# Patient Record
Sex: Female | Born: 1945 | Race: Black or African American | Hispanic: No | Marital: Married | Smoking: Former smoker
Health system: Southern US, Community
[De-identification: ages and names within clinical notes are randomized; demographics above are authoritative.]

## PROBLEM LIST (undated history)

## (undated) DIAGNOSIS — C50919 Malignant neoplasm of unspecified site of unspecified female breast: Secondary | ICD-10-CM

## (undated) DIAGNOSIS — B379 Candidiasis, unspecified: Secondary | ICD-10-CM

## (undated) DIAGNOSIS — E785 Hyperlipidemia, unspecified: Secondary | ICD-10-CM

## (undated) DIAGNOSIS — F329 Major depressive disorder, single episode, unspecified: Secondary | ICD-10-CM

## (undated) DIAGNOSIS — N760 Acute vaginitis: Secondary | ICD-10-CM

## (undated) DIAGNOSIS — M1990 Unspecified osteoarthritis, unspecified site: Secondary | ICD-10-CM

## (undated) DIAGNOSIS — G43909 Migraine, unspecified, not intractable, without status migrainosus: Secondary | ICD-10-CM

## (undated) DIAGNOSIS — B009 Herpesviral infection, unspecified: Secondary | ICD-10-CM

## (undated) DIAGNOSIS — Z8669 Personal history of other diseases of the nervous system and sense organs: Secondary | ICD-10-CM

## (undated) DIAGNOSIS — F32A Depression, unspecified: Secondary | ICD-10-CM

## (undated) DIAGNOSIS — N952 Postmenopausal atrophic vaginitis: Secondary | ICD-10-CM

## (undated) DIAGNOSIS — I10 Essential (primary) hypertension: Secondary | ICD-10-CM

## (undated) DIAGNOSIS — L900 Lichen sclerosus et atrophicus: Secondary | ICD-10-CM

## (undated) HISTORY — DX: Postmenopausal atrophic vaginitis: N95.2

## (undated) HISTORY — DX: Migraine, unspecified, not intractable, without status migrainosus: G43.909

## (undated) HISTORY — DX: Major depressive disorder, single episode, unspecified: F32.9

## (undated) HISTORY — DX: Malignant neoplasm of unspecified site of unspecified female breast: C50.919

## (undated) HISTORY — PX: TONSILLECTOMY: SUR1361

## (undated) HISTORY — DX: Lichen sclerosus et atrophicus: L90.0

## (undated) HISTORY — DX: Herpesviral infection, unspecified: B00.9

## (undated) HISTORY — DX: Acute vaginitis: N76.0

## (undated) HISTORY — DX: Hyperlipidemia, unspecified: E78.5

## (undated) HISTORY — DX: Unspecified osteoarthritis, unspecified site: M19.90

## (undated) HISTORY — DX: Personal history of other diseases of the nervous system and sense organs: Z86.69

## (undated) HISTORY — DX: Depression, unspecified: F32.A

## (undated) HISTORY — PX: WISDOM TOOTH EXTRACTION: SHX21

## (undated) HISTORY — PX: TUBAL LIGATION: SHX77

## (undated) HISTORY — DX: Candidiasis, unspecified: B37.9

## (undated) HISTORY — PX: DILATION AND CURETTAGE OF UTERUS: SHX78

---

## 1985-12-28 HISTORY — PX: ABDOMINAL HYSTERECTOMY: SHX81

## 1998-12-12 ENCOUNTER — Other Ambulatory Visit: Admission: RE | Admit: 1998-12-12 | Discharge: 1998-12-12 | Payer: Self-pay | Admitting: Obstetrics & Gynecology

## 2000-10-28 ENCOUNTER — Encounter: Payer: Self-pay | Admitting: Internal Medicine

## 2000-10-28 ENCOUNTER — Encounter: Admission: RE | Admit: 2000-10-28 | Discharge: 2000-10-28 | Payer: Self-pay | Admitting: Internal Medicine

## 2001-05-04 ENCOUNTER — Encounter: Payer: Self-pay | Admitting: Internal Medicine

## 2001-05-04 ENCOUNTER — Encounter: Admission: RE | Admit: 2001-05-04 | Discharge: 2001-05-04 | Payer: Self-pay | Admitting: Internal Medicine

## 2003-01-19 ENCOUNTER — Encounter: Payer: Self-pay | Admitting: Internal Medicine

## 2003-01-19 ENCOUNTER — Encounter: Admission: RE | Admit: 2003-01-19 | Discharge: 2003-01-19 | Payer: Self-pay | Admitting: Internal Medicine

## 2004-12-28 DIAGNOSIS — N760 Acute vaginitis: Secondary | ICD-10-CM

## 2004-12-28 DIAGNOSIS — B379 Candidiasis, unspecified: Secondary | ICD-10-CM

## 2004-12-28 HISTORY — DX: Candidiasis, unspecified: B37.9

## 2004-12-28 HISTORY — DX: Acute vaginitis: N76.0

## 2005-06-12 ENCOUNTER — Encounter: Admission: RE | Admit: 2005-06-12 | Discharge: 2005-09-10 | Payer: Self-pay | Admitting: Internal Medicine

## 2007-12-29 DIAGNOSIS — N952 Postmenopausal atrophic vaginitis: Secondary | ICD-10-CM

## 2007-12-29 HISTORY — DX: Postmenopausal atrophic vaginitis: N95.2

## 2008-05-16 ENCOUNTER — Encounter (INDEPENDENT_AMBULATORY_CARE_PROVIDER_SITE_OTHER): Payer: Self-pay | Admitting: Obstetrics and Gynecology

## 2008-05-16 ENCOUNTER — Ambulatory Visit (HOSPITAL_COMMUNITY): Admission: RE | Admit: 2008-05-16 | Discharge: 2008-05-16 | Payer: Self-pay | Admitting: Obstetrics and Gynecology

## 2011-05-12 NOTE — H&P (Signed)
Elizabeth Benton, ROSSETTI NO.:  [id number]   MEDICAL RECORD NO.:  [id number]          PATIENT TYPE:   LOCATION:                                FACILITY:  [facility]   PHYSICIAN:  Naima A. Dillard, M.D.      DATE OF BIRTH:   DATE OF ADMISSION:  05/16/2008  DATE OF DISCHARGE:                              HISTORY & PHYSICAL   The patient has chronic vaginitis and vulvitis.  The patient is a 65-  year-old, gravida 4, para 2, who was diagnosed with HSV-2 and vaginal  atrophy. Despite Valtrex and Estrace cream, the patient continues to  have severe itching, pain, and vulvitis.  The patient also had an  abnormal Pap smear, but her colposcopy and biopsy were found to be  negative.  Her vagina remains red and dry.  Despite trying new and  different treatments, she has not gotten any relief.  The patient has  been tried on [employer].  She has been tried on Valtrex. She has tried  Estrace cream.  She has tried acyclovir cream and jelly and is still  without relief.  Offered to do a biopsy in the office, but the patient  said the area was too painful, and she wanted to do it under sedation.   PAST MEDICAL HISTORY:  Significant for above and migraine headaches.   MEDICATIONS:  Include Imitrex and Valtrex.   PAST SURGICAL HISTORY:  Significant for a hysterectomy in 1986 and a  D&C, also history of tubal ligation.   PAST OB HISTORY:  History of vaginal delivery x2 and 2 miscarriages.   ALLERGIES:  DEMEROL.   SOCIAL HISTORY:  The patient denies any tobacco, alcohol, or drug use.   REVIEW OF SYSTEMS:  GENITOURINARY:  Significant for HSV-2.  NEUROLOGIC:  Significant for headaches.  MUSCULOSKELETAL:  No weakness.  CARDIOVASCULAR:  No heart palpitations.  ENDOCRINE:  No endocrine  disorders.   PHYSICAL EXAMINATION:  VITAL SIGNS:  Blood pressure 130/78, temperature  98.2.  She weighs 142.5 pounds.  HEENT:  Pupils are equal.  Hearing is normal.  Oropharynx clear.  NECK:  Thyroid is not  enlarged.  HEART:  Regular rate and rhythm.  LUNGS:  Clear to auscultation bilaterally.  BREASTS:  No masses, discharge, skin change, nipple retractions.  BACK:  No CVA tenderness bilaterally.  ABDOMEN:  Nontender without mass or organomegaly.  EXTREMITIES:  No cyanosis, clubbing, or edema.  NEUROLOGIC:  Within normal limits.  PELVIC:  Vaginal exam is within normal limits.  Cervix and uterus are  surgically absent.  Adnexa have no masses.  Rectovaginal no masses.   ASSESSMENT:  1. Vaginal atrophy.  2. Herpesvirus type 2  3. Chronic vaginitis.  4. Vulvitis.   PLAN:  Biopsy for definitive answers while we are unable to successfully  treat this.      Naima A. Normand Sloop, M.D.  Electronically Signed     NAD/MEDQ  D:  05/15/2008  T:  05/15/2008  Job:  [id number]

## 2011-05-12 NOTE — Op Note (Signed)
Elizabeth Benton, Elizabeth Benton               ACCOUNT NO.:  [id number]   MEDICAL RECORD NO.:  [id number]          PATIENT TYPE:  AMB   LOCATION:  [facility]                           FACILITY:  [facility]   PHYSICIAN:  Naima A. Dillard, M.D. DATE OF BIRTH:  [birth year]   DATE OF PROCEDURE:  05/16/2008  DATE OF DISCHARGE:                               OPERATIVE REPORT   PREOPERATIVE DIAGNOSIS:  Chronic vulvitis.   POSTOPERATIVE DIAGNOSIS:  Chronic vulvitis.   PROCEDURE:  Vulvar biopsy.   SURGEON:  Naima A. Dillard, M.D.   ASSISTANT:  None.   ANESTHESIA:  MAC and local.   FINDINGS:  Atrophic erythematous vulva and perineum.   SPECIMENS:  Vulvar biopsies.  They were sent to pathology.   ESTIMATED BLOOD LOSS:  Minimal.   COMPLICATIONS:  None.   PROCEDURE:  The patient was taken to the operating room where she was  given MAC anesthesia, placed in dorsal lithotomy position, and prepped  and draped in a normal sterile fashion.  A total of 10 mL was placed at  each biopsy site.  The first biopsy was done on the patient's left labia  majora and then on the patient's left perineum, and then on the  patient's right labia majora.  Each biopsy was done in elliptical  fashion with full thickness of the dermis with a knife and labeled  appropriately.  The bleeding was made hemostatic using 3-0 chromic in  the dermis and then 3-0 Monocryl for subcuticular closure.  The  remainder of the lidocaine was placed underneath the areas for  anesthesia. We drained the patient's bladder.  The sponge, lap, and  needles counts were correct.  The patient went to recovery room in  stable condition.      Naima A. Normand Sloop, M.D.  Electronically Signed     NAD/MEDQ  D:  05/16/2008  T:  05/17/2008  Job:  [id number]

## 2011-06-22 ENCOUNTER — Encounter (INDEPENDENT_AMBULATORY_CARE_PROVIDER_SITE_OTHER): Payer: Self-pay | Admitting: Surgery

## 2011-06-23 ENCOUNTER — Ambulatory Visit (INDEPENDENT_AMBULATORY_CARE_PROVIDER_SITE_OTHER): Payer: BC Managed Care – PPO | Admitting: Surgery

## 2011-06-23 ENCOUNTER — Encounter (INDEPENDENT_AMBULATORY_CARE_PROVIDER_SITE_OTHER): Payer: Self-pay | Admitting: Surgery

## 2011-06-23 VITALS — BP 138/84 | HR 72 | Temp 97.1°F | Ht 60.0 in | Wt 142.6 lb

## 2011-06-23 DIAGNOSIS — Z789 Other specified health status: Secondary | ICD-10-CM

## 2011-06-23 DIAGNOSIS — J349 Unspecified disorder of nose and nasal sinuses: Secondary | ICD-10-CM | POA: Insufficient documentation

## 2011-06-23 DIAGNOSIS — R222 Localized swelling, mass and lump, trunk: Secondary | ICD-10-CM | POA: Insufficient documentation

## 2011-06-23 DIAGNOSIS — J329 Chronic sinusitis, unspecified: Secondary | ICD-10-CM

## 2011-06-23 DIAGNOSIS — R229 Localized swelling, mass and lump, unspecified: Secondary | ICD-10-CM

## 2011-06-23 DIAGNOSIS — Z973 Presence of spectacles and contact lenses: Secondary | ICD-10-CM

## 2011-06-23 NOTE — Patient Instructions (Signed)
Pt needs to be scheduled for OR.  Follow up approx. 1 week post op

## 2011-06-23 NOTE — Progress Notes (Signed)
Subjective:     Patient ID: Elizabeth Benton, female   DOB: [birth year], 65 y.o.   MRN: [id number]    BP 138/84  Pulse 72  Temp(Src) 97.1 F (36.2 C) (Temporal)  Ht 5' (1.524 m)  Wt 142 lb 9.6 oz (64.683 kg)  BMI 27.85 kg/m2    HPI This is a 65 year old female referred for a mass on her right lower back. It has been there for at least 6 or 7 months. It has been causing lower back discomfort. It has slowly been getting larger. She has some discomfort in her right hip and discomfort down her leg. The pain is mild to moderate. She has had no similar masses in the past.  Review of Systems  Constitutional: Negative.   Eyes: Negative.   Respiratory: Negative.   Cardiovascular: Negative.   Gastrointestinal: Negative.   Genitourinary: Negative.   Musculoskeletal: Negative.   Neurological: Positive for headaches.  Hematological: Negative.   Psychiatric/Behavioral: Negative.        Objective:   Physical Exam  Constitutional: She is oriented to person, place, and time. She appears well-developed and well-nourished.  HENT:  Head: Atraumatic.  Nose: Nose normal.  Mouth/Throat: Oropharynx is clear and moist.  Eyes: Pupils are equal, round, and reactive to light.  Neck: Normal range of motion. Neck supple. No tracheal deviation present. No thyromegaly present.  Cardiovascular: Normal rate, regular rhythm and normal heart sounds.   No murmur heard. Pulmonary/Chest: Effort normal and breath sounds normal. No respiratory distress.  Musculoskeletal: Normal range of motion. She exhibits no edema and no tenderness.       Two separate 3cm masses in the right lower back.  Both are superficial and mobile and non-tender  Lymphadenopathy:    She has no cervical adenopathy.  Neurological: She is alert and oriented to person, place, and time. No cranial nerve deficit. Coordination normal.  Skin: Skin is warm and dry. No rash noted. No erythema. No pallor.       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 Plan:     Given that the patient is symptomatic and I cannot rule out malignancy, removal of these masses is recommended. I have discussed this with the patient in detail. I have discussed the risk of the surgery which include but are not limited to bleeding, infection, injury to stranding structures, recurrence, etc.Surgery will thus be scheduled.

## 2011-07-13 ENCOUNTER — Ambulatory Visit (HOSPITAL_BASED_OUTPATIENT_CLINIC_OR_DEPARTMENT_OTHER): Admission: RE | Admit: 2011-07-13 | Payer: BC Managed Care – PPO | Source: Ambulatory Visit | Admitting: General Surgery

## 2011-07-22 ENCOUNTER — Encounter (INDEPENDENT_AMBULATORY_CARE_PROVIDER_SITE_OTHER): Payer: BC Managed Care – PPO | Admitting: Surgery

## 2011-09-23 LAB — CBC
HCT: 39.8
Hemoglobin: 13.4
MCHC: 33.6
MCV: 85.4
Platelets: 230
RBC: 4.66
RDW: 14.1
WBC: 5.7

## 2012-04-22 ENCOUNTER — Telehealth: Payer: Self-pay | Admitting: Obstetrics and Gynecology

## 2012-04-22 NOTE — Telephone Encounter (Signed)
Routed to niccole 

## 2012-04-22 NOTE — Telephone Encounter (Signed)
Spoke with pt rgd msg informed pt ND out of office will consult with her on Monday and give her a call backpt voice understanding

## 2012-04-24 NOTE — Telephone Encounter (Signed)
Yes she may have a refill on the clabetsol

## 2012-04-25 ENCOUNTER — Other Ambulatory Visit: Payer: Self-pay

## 2012-04-25 MED ORDER — CLOBETASOL PROPIONATE 0.05 % EX CREA: TOPICAL_CREAM | CUTANEOUS | Status: DC

## 2012-04-27 ENCOUNTER — Other Ambulatory Visit: Payer: Self-pay | Admitting: Obstetrics and Gynecology

## 2012-04-27 NOTE — Telephone Encounter (Signed)
Lm on vm rx clobetasol called to [facility] [address] can pick up at [facility] [address]

## 2012-06-01 ENCOUNTER — Ambulatory Visit (INDEPENDENT_AMBULATORY_CARE_PROVIDER_SITE_OTHER): Payer: PRIVATE HEALTH INSURANCE | Admitting: Obstetrics and Gynecology

## 2012-06-01 ENCOUNTER — Encounter: Payer: Self-pay | Admitting: Obstetrics and Gynecology

## 2012-06-01 VITALS — BP 110/70 | HR 72 | Ht 60.5 in | Wt 141.0 lb

## 2012-06-01 DIAGNOSIS — F3289 Other specified depressive episodes: Secondary | ICD-10-CM

## 2012-06-01 DIAGNOSIS — N952 Postmenopausal atrophic vaginitis: Secondary | ICD-10-CM

## 2012-06-01 DIAGNOSIS — B3731 Acute candidiasis of vulva and vagina: Secondary | ICD-10-CM

## 2012-06-01 DIAGNOSIS — B373 Candidiasis of vulva and vagina: Secondary | ICD-10-CM

## 2012-06-01 DIAGNOSIS — Z8669 Personal history of other diseases of the nervous system and sense organs: Secondary | ICD-10-CM

## 2012-06-01 DIAGNOSIS — B009 Herpesviral infection, unspecified: Secondary | ICD-10-CM

## 2012-06-01 DIAGNOSIS — B379 Candidiasis, unspecified: Secondary | ICD-10-CM

## 2012-06-01 DIAGNOSIS — E785 Hyperlipidemia, unspecified: Secondary | ICD-10-CM

## 2012-06-01 DIAGNOSIS — L940 Localized scleroderma [morphea]: Secondary | ICD-10-CM

## 2012-06-01 DIAGNOSIS — L900 Lichen sclerosus et atrophicus: Secondary | ICD-10-CM | POA: Insufficient documentation

## 2012-06-01 DIAGNOSIS — N898 Other specified noninflammatory disorders of vagina: Secondary | ICD-10-CM

## 2012-06-01 DIAGNOSIS — F329 Major depressive disorder, single episode, unspecified: Secondary | ICD-10-CM

## 2012-06-01 DIAGNOSIS — E782 Mixed hyperlipidemia: Secondary | ICD-10-CM | POA: Insufficient documentation

## 2012-06-01 DIAGNOSIS — F32A Depression, unspecified: Secondary | ICD-10-CM | POA: Insufficient documentation

## 2012-06-01 DIAGNOSIS — Z719 Counseling, unspecified: Secondary | ICD-10-CM

## 2012-06-01 LAB — POCT WET PREP (WET MOUNT)
Whiff Test: NEGATIVE
pH: 5

## 2012-06-01 MED ORDER — FLUCONAZOLE 150 MG PO TABS: 150.0000 mg | ORAL_TABLET | Freq: Once | ORAL | Status: AC

## 2012-06-01 MED ORDER — CLOBETASOL PROP EMOLLIENT BASE 0.05 % EX CREA: TOPICAL_CREAM | CUTANEOUS | Status: DC

## 2012-06-01 NOTE — Progress Notes (Signed)
65 YO complains of itchy dark vaginal discharge x 2 weeks. Used Benadryl itching cream on outside.  Has lichen sclerosis of vulva but has been out of Temovate cream x 6 months.  O:  EGBUS-erythematous, vagina-atrophic with scant beige discharge with specks of white particulate; uterus/cervix surgically absent, adnexae-no tenderness  Wet Prep:   pH 5.0   whiff-negative   yeast  +  A: Candida Vaginitis      Vulvar Lichen Sclerosis  P: Clobetasol Cream .05%  30 grams apply to affected area      3 times weekly prn  1 refill       Perineal hygiene      Diflucan 150 mg #1  1 po stat 1 rf  Zephyra Bernardi, PA-C

## 2012-06-01 NOTE — Progress Notes (Signed)
Color: brown Odor: no Itching:yes Thin:yes Thick:no Fever:no Dyspareunia:no Hx PID:no HX ZOX:[id number] Pelvic Pain:no Desires Gc/CT:no Desires HIV,RPR,HbsAG:no

## 2012-06-01 NOTE — Patient Instructions (Addendum)
Avoid: - excess soap on genital area (consider using plain oatmeal soap) - use of powder or sprays in genital area - douching - wearing underwear to bed (except with menses) - using more than is directed detergent when washing clothes - tight fitting garments around genital area - excess sugar intake  May use Zeabsorb for dryness

## 2013-04-14 ENCOUNTER — Emergency Department (INDEPENDENT_AMBULATORY_CARE_PROVIDER_SITE_OTHER)
Admission: EM | Admit: 2013-04-14 | Discharge: 2013-04-14 | Disposition: A | Discharge status: Home / Self Care | Payer: Medicare Other | Source: Home / Self Care

## 2013-04-14 ENCOUNTER — Encounter (HOSPITAL_COMMUNITY): Payer: Self-pay | Admitting: Emergency Medicine

## 2013-04-14 DIAGNOSIS — S43429A Sprain of unspecified rotator cuff capsule, initial encounter: Secondary | ICD-10-CM

## 2013-04-14 DIAGNOSIS — S46012A Strain of muscle(s) and tendon(s) of the rotator cuff of left shoulder, initial encounter: Secondary | ICD-10-CM

## 2013-04-14 MED ORDER — TRAMADOL HCL 50 MG PO TABS: 50.0000 mg | ORAL_TABLET | Freq: Four times a day (QID) | ORAL | Status: DC | PRN

## 2013-04-14 MED ORDER — MELOXICAM 15 MG PO TABS: 15.0000 mg | ORAL_TABLET | Freq: Every day | ORAL | Status: DC | PRN

## 2013-04-14 NOTE — ED Provider Notes (Signed)
Elizabeth Benton is a 67 y.o. female who presents to Urgent Care today for left shoulder pain this is been present for the last 5 days. She has pain from her left trapezius to her left upper arm. She has pain is worse at night and with overhand motion. She denies any radiating pain weakness or numbness. She denies any specific injury. She denies any history of similar injuries and feels well otherwise. She's tried ibuprofen which has been somewhat helpful. 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    PMH reviewed. Hyperlipidemia History  Substance Use Topics  . Smoking status: Former Smoker    Quit date: 01/22/1997  . Smokeless tobacco: Not on file  . Alcohol Use: 0.5 oz/week    1 drink(s) per week   ROS as above Medications reviewed. No current facility-administered medications for this encounter.   Current Outpatient Prescriptions  Medication Sig Dispense Refill  . Atorvastatin Calcium (LIPITOR PO) Take by mouth.      . ALPRAZolam (XANAX) 0.25 MG tablet Take 0.25 mg by mouth at bedtime as needed.        . clobetasol cream (TEMOVATE) 0.05 % Apply topically 2 (two) times a week.  60 g  2  . Clobetasol Prop Emollient Base 0.05 % emollient cream apply small amount to affected area 3 times a week prn  30 g  1  . ergocalciferol (VITAMIN D2) 50000 UNITS capsule Take 50,000 Units by mouth once a week.        . estrogens, conjugated, (PREMARIN) 0.625 MG tablet Take 0.625 mg by mouth daily. Take daily for 21 days then do not take for 7 days.       . meloxicam (MOBIC) 15 MG tablet Take 1 tablet (15 mg total) by mouth daily as needed for pain.  14 tablet  0  . omega-3 acid ethyl esters (LOVAZA) 1 G capsule Take 1 g by mouth 2 (two) times daily.        . SUMAtriptan (IMITREX) 100 MG tablet Take 100 mg by mouth every 2 (two) hours as needed.        . traMADol (ULTRAM) 50 MG tablet Take 1 tablet (50 mg total) by mouth every 6 (six) hours as needed for pain.  30 tablet  0  . zoster vaccine  live, PF, (ZOSTAVAX) 19147 UNT/0.65ML injection Inject 0.65 mLs into the skin once.          Exam:  BP 161/94  Pulse 70  Temp(Src) 98.1 F (36.7 C) (Oral)  Resp 18  SpO2 98% Gen: Well NAD Left shoulder: Normal-appearing nontender. Painful arc with abduction to 160. External rotation to 90 internal rotation to the lumbar spine. Negative drop arm sign Strength 4/5 supraspinatus, 4+/5 external and internal rotation Positive Hawkins and Neers test. Positive empty can test. Pulses capillary refill and sensation are intact distally  Neck: Nontender spinal midline normal neck range of motion   No results found for this or any previous visit (from the past 24 hour(s)). No results found.  Assessment and Plan: 67 y.o. female with left shoulder rotator cuff tendinitis/bursitis. No definitive tears.  Plan for oral meloxicam tramadol. Home exercise program [facility]up [facility] in 2 weeks if not improved. Discussed warning signs or symptoms. Please see discharge instructions. Patient expresses understanding.      Rodolph Bong, MD 04/14/13 [phone]

## 2013-04-14 NOTE — ED Notes (Addendum)
Onset 3 days ago of left neck pain , pain into left shoulder.  No known injury. Has used heat pad and medicated pack.  No numbness or tingling in hand or fingers

## 2013-04-14 NOTE — ED Provider Notes (Signed)
Medical screening examination/treatment/procedure(s) were performed by a resident physician and as supervising physician I was immediately available for consultation/collaboration.  Leslee Home, M.D.  Reuben Likes, MD 04/14/13 2109

## 2014-01-29 ENCOUNTER — Other Ambulatory Visit: Payer: Self-pay | Admitting: Internal Medicine

## 2014-01-29 DIAGNOSIS — M545 Low back pain, unspecified: Secondary | ICD-10-CM

## 2014-02-08 ENCOUNTER — Ambulatory Visit
Admission: RE | Admit: 2014-02-08 | Discharge: 2014-02-08 | Disposition: A | Discharge status: Home / Self Care | Payer: BC Managed Care – PPO | Source: Ambulatory Visit | Attending: Internal Medicine | Admitting: Internal Medicine

## 2014-02-08 DIAGNOSIS — M545 Low back pain, unspecified: Secondary | ICD-10-CM

## 2014-08-06 ENCOUNTER — Emergency Department (HOSPITAL_COMMUNITY): Payer: BC Managed Care – PPO

## 2014-08-06 ENCOUNTER — Encounter (HOSPITAL_COMMUNITY): Payer: Self-pay | Admitting: Emergency Medicine

## 2014-08-06 ENCOUNTER — Emergency Department (HOSPITAL_COMMUNITY)
Admission: EM | Admit: 2014-08-06 | Discharge: 2014-08-06 | Disposition: A | Discharge status: Home / Self Care | Payer: BC Managed Care – PPO | Attending: Emergency Medicine | Admitting: Emergency Medicine

## 2014-08-06 DIAGNOSIS — Y9389 Activity, other specified: Secondary | ICD-10-CM | POA: Diagnosis not present

## 2014-08-06 DIAGNOSIS — Z872 Personal history of diseases of the skin and subcutaneous tissue: Secondary | ICD-10-CM | POA: Diagnosis not present

## 2014-08-06 DIAGNOSIS — F3289 Other specified depressive episodes: Secondary | ICD-10-CM | POA: Insufficient documentation

## 2014-08-06 DIAGNOSIS — Z8742 Personal history of other diseases of the female genital tract: Secondary | ICD-10-CM | POA: Diagnosis not present

## 2014-08-06 DIAGNOSIS — S161XXA Strain of muscle, fascia and tendon at neck level, initial encounter: Secondary | ICD-10-CM

## 2014-08-06 DIAGNOSIS — Z79899 Other long term (current) drug therapy: Secondary | ICD-10-CM | POA: Diagnosis not present

## 2014-08-06 DIAGNOSIS — IMO0002 Reserved for concepts with insufficient information to code with codable children: Secondary | ICD-10-CM | POA: Insufficient documentation

## 2014-08-06 DIAGNOSIS — Z862 Personal history of diseases of the blood and blood-forming organs and certain disorders involving the immune mechanism: Secondary | ICD-10-CM | POA: Insufficient documentation

## 2014-08-06 DIAGNOSIS — Z8619 Personal history of other infectious and parasitic diseases: Secondary | ICD-10-CM | POA: Diagnosis not present

## 2014-08-06 DIAGNOSIS — Z87891 Personal history of nicotine dependence: Secondary | ICD-10-CM | POA: Diagnosis not present

## 2014-08-06 DIAGNOSIS — S139XXA Sprain of joints and ligaments of unspecified parts of neck, initial encounter: Secondary | ICD-10-CM | POA: Insufficient documentation

## 2014-08-06 DIAGNOSIS — Y92410 Unspecified street and highway as the place of occurrence of the external cause: Secondary | ICD-10-CM | POA: Diagnosis not present

## 2014-08-06 DIAGNOSIS — S335XXA Sprain of ligaments of lumbar spine, initial encounter: Secondary | ICD-10-CM | POA: Insufficient documentation

## 2014-08-06 DIAGNOSIS — S199XXA Unspecified injury of neck, initial encounter: Secondary | ICD-10-CM

## 2014-08-06 DIAGNOSIS — F329 Major depressive disorder, single episode, unspecified: Secondary | ICD-10-CM | POA: Diagnosis not present

## 2014-08-06 DIAGNOSIS — Z8679 Personal history of other diseases of the circulatory system: Secondary | ICD-10-CM | POA: Diagnosis not present

## 2014-08-06 DIAGNOSIS — S0993XA Unspecified injury of face, initial encounter: Secondary | ICD-10-CM | POA: Insufficient documentation

## 2014-08-06 DIAGNOSIS — Z8639 Personal history of other endocrine, nutritional and metabolic disease: Secondary | ICD-10-CM | POA: Insufficient documentation

## 2014-08-06 DIAGNOSIS — S39012A Strain of muscle, fascia and tendon of lower back, initial encounter: Secondary | ICD-10-CM

## 2014-08-06 MED ORDER — SODIUM CHLORIDE 0.9 % IV BOLUS (SEPSIS)
1000.0000 mL | Freq: Once | INTRAVENOUS | Status: AC
  Administered 2014-08-06: 1000 mL via INTRAVENOUS

## 2014-08-06 MED ORDER — DIAZEPAM 5 MG PO TABS: 5.0000 mg | ORAL_TABLET | Freq: Two times a day (BID) | ORAL | Status: DC

## 2014-08-06 MED ORDER — OXYCODONE-ACETAMINOPHEN 5-325 MG PO TABS
1.0000 | ORAL_TABLET | Freq: Once | ORAL | Status: AC
  Administered 2014-08-06: 1 via ORAL
  Filled 2014-08-06: qty 1

## 2014-08-06 MED ORDER — DIAZEPAM 5 MG PO TABS
5.0000 mg | ORAL_TABLET | Freq: Once | ORAL | Status: AC
  Administered 2014-08-06: 5 mg via ORAL
  Filled 2014-08-06: qty 1

## 2014-08-06 MED ORDER — ONDANSETRON HCL 4 MG/2ML IJ SOLN
4.0000 mg | Freq: Once | INTRAMUSCULAR | Status: AC
  Administered 2014-08-06: 4 mg via INTRAVENOUS
  Filled 2014-08-06: qty 2

## 2014-08-06 MED ORDER — MORPHINE SULFATE 4 MG/ML IJ SOLN
4.0000 mg | Freq: Once | INTRAMUSCULAR | Status: AC
Start: 2014-08-06 — End: 2014-08-06
  Administered 2014-08-06: 4 mg via INTRAMUSCULAR
  Filled 2014-08-06: qty 1

## 2014-08-06 MED ORDER — ONDANSETRON HCL 4 MG PO TABS: 4.0000 mg | ORAL_TABLET | Freq: Four times a day (QID) | ORAL | Status: DC | PRN

## 2014-08-06 MED ORDER — OXYCODONE-ACETAMINOPHEN 5-325 MG PO TABS: ORAL_TABLET | ORAL | Status: DC
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
## 2014-08-06 NOTE — Discharge Instructions (Signed)
Please take ibuprofen 400mg  (this is normally 2 over the counter pills) every 6 hours (take with food to minimze stomach irritation).   Take valium and/or percocet for breakthrough pain, do not drink alcohol, drive, care for children or perfom other critical tasks while taking valium and/or percocet.  Please follow with your primary care doctor in the next 2 days for a check-up. They must obtain records for further management.   Do not hesitate to return to the Emergency Department for any new, worsening or concerning symptoms.    Lumbosacral Strain Lumbosacral strain is a strain of any of the parts that make up your lumbosacral vertebrae. Your lumbosacral vertebrae are the bones that make up the lower third of your backbone. Your lumbosacral vertebrae are held together by muscles and tough, fibrous tissue (ligaments).  CAUSES  A sudden blow to your back can cause lumbosacral strain. Also, anything that causes an excessive stretch of the muscles in the low back can cause this strain. This is typically seen when people exert themselves strenuously, fall, lift heavy objects, bend, or crouch repeatedly. RISK FACTORS  Physically demanding work.  Participation in pushing or pulling sports or sports that require a sudden twist of the back (tennis, golf, baseball).  Weight lifting.  Excessive lower back curvature.  Forward-tilted pelvis.  Weak back or abdominal muscles or both.  Tight hamstrings. SIGNS AND SYMPTOMS  Lumbosacral strain may cause pain in the area of your injury or pain that moves (radiates) down your leg.  DIAGNOSIS Your health care provider can often diagnose lumbosacral strain through a physical exam. In some cases, you may need tests such as X-ray exams.  TREATMENT  Treatment for your lower back injury depends on many factors that your clinician will have to evaluate. However, most treatment will include the use of anti-inflammatory medicines. HOME CARE INSTRUCTIONS    Avoid hard physical activities (tennis, racquetball, waterskiing) if you are not in proper physical condition for it. This may aggravate or create problems.  If you have a back problem, avoid sports requiring sudden body movements. Swimming and walking are generally safer activities.  Maintain good posture.  Maintain a healthy weight.  For acute conditions, you may put ice on the injured area.  Put ice in a plastic bag.  Place a towel between your skin and the bag.  Leave the ice on for 20 minutes, 2-3 times a day.  When the low back starts healing, stretching and strengthening exercises may be recommended. SEEK MEDICAL CARE IF:  Your back pain is getting worse.  You experience severe back pain not relieved with medicines. SEEK IMMEDIATE MEDICAL CARE IF:   You have numbness, tingling, weakness, or problems with the use of your arms or legs.  There is a change in bowel or bladder control.  You have increasing pain in any area of the body, including your belly (abdomen).  You notice shortness of breath, dizziness, or feel faint.  You feel sick to your stomach (nauseous), are throwing up (vomiting), or become sweaty.  You notice discoloration of your toes or legs, or your feet get very cold. MAKE SURE YOU:   Understand these instructions.  Will watch your condition.  Will get help right away if you are not doing well or get worse. Document Released: 09/23/2005 Document Revised: 12/19/2013 Document Reviewed: 08/02/2013 [facility] Patient Information 2015 [place], [place]. This information is not intended to replace advice given to you by your health care provider. Make sure you discuss any questions  you have with your health care provider.  
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
## 2014-08-06 NOTE — ED Notes (Signed)
Elizabeth Benton at bedside
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
## 2014-10-29 ENCOUNTER — Encounter (HOSPITAL_COMMUNITY): Payer: Self-pay | Admitting: Emergency Medicine

## 2014-12-18 ENCOUNTER — Other Ambulatory Visit: Payer: Self-pay | Admitting: Neurosurgery

## 2014-12-18 DIAGNOSIS — M4316 Spondylolisthesis, lumbar region: Secondary | ICD-10-CM

## 2015-01-03 ENCOUNTER — Ambulatory Visit
Admission: RE | Admit: 2015-01-03 | Discharge: 2015-01-03 | Disposition: A | Discharge status: Home / Self Care | Payer: BLUE CROSS/BLUE SHIELD | Source: Ambulatory Visit | Attending: Neurosurgery | Admitting: Neurosurgery

## 2015-01-03 DIAGNOSIS — M4316 Spondylolisthesis, lumbar region: Secondary | ICD-10-CM

## 2015-09-27 ENCOUNTER — Other Ambulatory Visit: Payer: Self-pay | Admitting: Internal Medicine

## 2015-09-27 DIAGNOSIS — E2839 Other primary ovarian failure: Secondary | ICD-10-CM

## 2015-10-19 ENCOUNTER — Other Ambulatory Visit: Payer: Self-pay | Admitting: Internal Medicine

## 2015-10-19 DIAGNOSIS — Z1231 Encounter for screening mammogram for malignant neoplasm of breast: Secondary | ICD-10-CM

## 2015-10-30 ENCOUNTER — Ambulatory Visit
Admission: RE | Admit: 2015-10-30 | Discharge: 2015-10-30 | Disposition: A | Discharge status: Home / Self Care | Payer: Medicare Other | Source: Ambulatory Visit | Attending: Internal Medicine | Admitting: Internal Medicine

## 2015-10-30 DIAGNOSIS — E2839 Other primary ovarian failure: Secondary | ICD-10-CM

## 2016-11-13 ENCOUNTER — Emergency Department (HOSPITAL_COMMUNITY)
Admission: EM | Admit: 2016-11-13 | Discharge: 2016-11-14 | Disposition: A | Discharge status: Home / Self Care | Payer: BLUE CROSS/BLUE SHIELD | Attending: Emergency Medicine | Admitting: Emergency Medicine

## 2016-11-13 ENCOUNTER — Encounter (HOSPITAL_COMMUNITY): Payer: Self-pay | Admitting: Nurse Practitioner

## 2016-11-13 DIAGNOSIS — Z79899 Other long term (current) drug therapy: Secondary | ICD-10-CM | POA: Diagnosis not present

## 2016-11-13 DIAGNOSIS — R05 Cough: Secondary | ICD-10-CM | POA: Diagnosis present

## 2016-11-13 DIAGNOSIS — J40 Bronchitis, not specified as acute or chronic: Secondary | ICD-10-CM | POA: Diagnosis not present

## 2016-11-13 DIAGNOSIS — Z87891 Personal history of nicotine dependence: Secondary | ICD-10-CM | POA: Insufficient documentation

## 2016-11-13 NOTE — ED Provider Notes (Signed)
[facility] DEPT Provider Note   CSN: [id number] Arrival date & time: 11/13/16  2252  By signing my name below, I, Maud Deed. Royston Sinner, attest that this documentation has been prepared under the direction and in the presence of Clary Boulais, MD.  Electronically Signed: Maud Deed. Royston Sinner, ED Scribe. 11/13/16. 12:38 AM.    History   Chief Complaint Chief Complaint  Patient presents with  . Pneumonia  . Leg Swelling   The history is provided by the patient. No language interpreter was used.  Cough  This is a new problem. The current episode started more than 2 days ago. The problem occurs constantly. The problem has been gradually worsening. The cough is non-productive. The maximum temperature recorded prior to her arrival was 101 to 101.9 F. Pertinent negatives include no chest pain, no chills, no sweats and no shortness of breath. She has tried nothing for the symptoms. The treatment provided no relief. She is not a smoker. Her past medical history is significant for pneumonia.    HPI Comments: Elizabeth Benton is a 70 y.o. female with a PMHx of hyperlipidemia who presents to the Emergency Department complaining of constant, unchanged cough x 3-4 days. Pt also reports ongoing fever of 101, malaise, bilateral knee swelling. Pt was evaluated by her PCP 3 days ago and was diagnosed with PNA. At that time, pt was started on a Z-Pak but has not noted any improvement with medication. At home breathing treatment also attempted earlier this evening without any improvement. No recent nausea, vomiting, shortness of breath, or chest pain.  PCP: Salena Saner., MD    Past Medical History:  Diagnosis Date  . Depression   . H/O migraine   . HSV-2 (herpes simplex virus 2) infection   . Hyperlipidemia   . Lichen sclerosus   . Monilia infection 2006  . Vaginal atrophy 2009  . Vaginitis and vulvovaginitis 2006    Patient Active Problem List   Diagnosis Date Noted  . Lichen sclerosus  [id number]  . Hyperlipidemia   . HSV-2 (herpes simplex virus 2) infection   . Depression   . H/O migraine   . Monilia infection   . Vaginal atrophy   . Wears glasses 06/23/2011  . Sinus problem/runny nose 06/23/2011  . Mass on back 06/23/2011    Past Surgical History:  Procedure Laterality Date  . ABDOMINAL HYSTERECTOMY  1987  . DILATION AND CURETTAGE OF UTERUS    . TONSILLECTOMY    . TUBAL LIGATION    . WISDOM TOOTH EXTRACTION      OB History    Gravida Para Term Preterm AB Living   4 2     2 2    SAB TAB Ectopic Multiple Live Births   2               Home Medications    Prior to Admission medications   Medication Sig Start Date End Date Taking? Authorizing Provider  acetaminophen (TYLENOL) 500 MG tablet Take 1,000 mg by mouth daily as needed (pain).    Historical Provider, MD  ALPRAZolam Duanne Moron) 0.25 MG tablet Take 0.25 mg by mouth at bedtime as needed for anxiety or sleep.     Historical Provider, MD  diazepam (VALIUM) 5 MG tablet Take 1 tablet (5 mg total) by mouth 2 (two) times daily. 08/06/14   Nicole Pisciotta, PA-C  ondansetron (ZOFRAN) 4 MG tablet Take 1 tablet (4 mg total) by mouth every 6 (six) hours as needed for nausea or vomiting.  08/06/14   Nicole Pisciotta, PA-C  oxyCODONE-acetaminophen (PERCOCET/ROXICET) 5-325 MG per tablet 1 to 2 tabs PO q6hrs  PRN for pain 08/06/14   Monico Blitz, PA-C    Family History Family History  Problem Relation Age of Onset  . Stroke Mother   . Cancer Father     Throat Cancer    Social History Social History  Substance Use Topics  . Smoking status: Former Smoker    Quit date: 01/22/1997  . Smokeless tobacco: Not on file  . Alcohol use 0.5 oz/week    1 Standard drinks or equivalent per week     Allergies   Contrast media [iodinated diagnostic agents]; Demerol; Iodine; and Shellfish allergy   Review of Systems Review of Systems  Constitutional: Negative for chills.  Respiratory: Positive for cough. Negative  for choking and shortness of breath.   Cardiovascular: Negative for chest pain, palpitations and leg swelling.  Gastrointestinal: Negative for constipation.  All other systems reviewed and are negative.    Physical Exam Updated Vital Signs BP 124/75 (BP Location: Left Arm)   Pulse 115   Temp 98.7 F (37.1 C) (Oral)   Resp 20   SpO2 99%   Physical Exam  Constitutional: She is oriented to person, place, and time. She appears well-developed and well-nourished. No distress.  HENT:  Head: Normocephalic and atraumatic.  Mouth/Throat: Oropharynx is clear and moist. No oropharyngeal exudate.  meliputi class I.  Eyes: EOM are normal. Pupils are equal, round, and reactive to light.  Neck:  No carotid bruits. Trachea is midline.  Cardiovascular: Normal rate, regular rhythm, normal heart sounds and intact distal pulses.   Pulmonary/Chest: Effort normal and breath sounds normal. No stridor.  When pt coughs, occasional rhonchi heard.  Abdominal: Soft. There is no tenderness.  Hyperactive bowel sounds noted.  Musculoskeletal: Normal range of motion. She exhibits no edema, tenderness or deformity.       Right knee: Normal. She exhibits normal range of motion and no swelling.       Left knee: Normal. She exhibits normal range of motion and no swelling.       Right ankle: Normal. Achilles tendon normal.       Left ankle: Normal. Achilles tendon normal.       Right lower leg: Normal.       Left lower leg: Normal.  BILATERAL KNEES:  No effusion noted. Negative anterior and posterior drawer test. No warmth noted. Quadriceps tendons are intact. Intact dorsalis pedis on L and R. No laxity noted with vagus or valgus stress. No patellar baja noted on exam.  No cords of the calves, non tender.  No Homan's sign.    Lymphadenopathy:    She has no cervical adenopathy.  Neurological: She is alert and oriented to person, place, and time. She displays normal reflexes.  Intact DTRs.  Skin: Skin is warm  and dry. Capillary refill takes less than 2 seconds. She is not diaphoretic.  Psychiatric: She has a normal mood and affect. Her behavior is normal. Judgment and thought content normal.  Nursing note and vitals reviewed.    ED Treatments / Results   Vitals:   11/14/16 0040 11/14/16 0157  BP: 110/72 127/63  Pulse: 96 95  Resp: 22 20  Temp: 97.3 F (36.3 C) 97.3 F (36.3 C)    DIAGNOSTIC STUDIES: Oxygen Saturation is 99% on RA, Normal by my interpretation.    COORDINATION OF CARE: 11:54 PM- Will give breathing treatment. Will order imaging and  blood work. Discussed treatment plan with pt at bedside and pt agreed to plan.     2:16 AM- Pt is already on an antibiotic and has 2 days left of her prescription.   Results for orders placed or performed during the hospital encounter of 11/13/16  CBC with Differential/Platelet  Result Value Ref Range   WBC 7.5 4.0 - 10.5 K/uL   RBC 4.92 3.87 - 5.11 MIL/uL   Hemoglobin 13.7 12.0 - 15.0 g/dL   HCT 42.0 36.0 - 46.0 %   MCV 85.4 78.0 - 100.0 fL   MCH 27.8 26.0 - 34.0 pg   MCHC 32.6 30.0 - 36.0 g/dL   RDW 14.2 11.5 - 15.5 %   Platelets 213 150 - 400 K/uL   Neutrophils Relative % 45 %   Lymphocytes Relative 36 %   Monocytes Relative 15 %   Eosinophils Relative 3 %   Basophils Relative 1 %   Neutro Abs 3.4 1.7 - 7.7 K/uL   Lymphs Abs 2.7 0.7 - 4.0 K/uL   Monocytes Absolute 1.1 (H) 0.1 - 1.0 K/uL   Eosinophils Absolute 0.2 0.0 - 0.7 K/uL   Basophils Absolute 0.1 0.0 - 0.1 K/uL   WBC Morphology ATYPICAL LYMPHOCYTES   Basic metabolic panel  Result Value Ref Range   Sodium 138 135 - 145 mmol/L   Potassium 4.4 3.5 - 5.1 mmol/L   Chloride 105 101 - 111 mmol/L   CO2 24 22 - 32 mmol/L   Glucose, Bld 102 (H) 65 - 99 mg/dL   BUN 17 6 - 20 mg/dL   Creatinine, Ser 0.98 0.44 - 1.00 mg/dL   Calcium 9.7 8.9 - 10.3 mg/dL   GFR calc non Af Amer 58 (L) >60 mL/min   GFR calc Af Amer >60 >60 mL/min   Anion gap 9 5 - 15   Dg Chest 2  View  Result Date: 11/14/2016 CLINICAL DATA:  Acute onset of severe cough and intermittent fever. Malaise. Initial encounter. EXAM: CHEST  2 VIEW COMPARISON:  None. FINDINGS: The lungs are well-aerated. Mild left basilar atelectasis or scarring is noted. There is no evidence of pleural effusion or pneumothorax. The heart is borderline normal in size. No acute osseous abnormalities are seen. IMPRESSION: Mild left basilar atelectasis or scarring noted. Lungs otherwise clear. Electronically Signed   By: Garald Balding M.D.   On: 11/14/2016 00:35    Procedures Procedures (including critical care time)  Medications  benzonatate (TESSALON) capsule 200 mg (200 mg Oral Given 11/14/16 0034)  ipratropium-albuterol (DUONEB) 0.5-2.5 (3) MG/3ML nebulizer solution 3 mL (3 mLs Nebulization Given 11/14/16 0038)  methylPREDNISolone sodium succinate (SOLU-MEDROL) 125 mg/2 mL injection 125 mg (125 mg Intravenous Given 11/14/16 0152)  guaiFENesin (ROBITUSSIN) 100 MG/5ML solution 100 mg (100 mg Oral Given 11/14/16 0155)  albuterol (PROVENTIL) (2.5 MG/3ML) 0.083% nebulizer solution 5 mg (5 mg Nebulization Given 11/14/16 0123)     Final Clinical Impressions(s) / ED Diagnoses  Non productive cough consistent with bronchitis: Sent in for pneumonia but there is no PNA on CXR nor clinically. O2 saturation is 98% on RA. Lungs are clear both before and after treatment. No rales.  No stridor, no JVD, airway is widely patent.  No fever without antipyretics no white count. Continue Zpak, will start an inhaler, steroids and Mucinex and tessalon TID.  Follow up Monday.  Patient states knees are swollen but clinically I do not see swelling B knee exam is normal. No warmth nor redness. B calves are  soft, non tender, no edema.  Strict return precautions given.    All questions answered to patient's satisfaction. Based on history and exam patient has been appropriately medically screened and emergency conditions excluded. Patient is  stable for discharge at this time. Follow up with your PMD for recheck in 2 days and strict return precautions given.   I personally performed the services described in this documentation, which was scribed in my presence. The recorded information has been reviewed and is accurate.      Veatrice Kells, MD 11/14/16 [phone]

## 2016-11-13 NOTE — ED Triage Notes (Signed)
Pt reports that she was evaluated by her PCP on Tuesday/3 days ago and diagnosed with PNA which she was to receive outpatient with a seek further treatment precautions of severe/worsening symptoms. Pt has an obvious severe cough, reports intermittent fever with malaise.

## 2016-11-14 ENCOUNTER — Emergency Department (HOSPITAL_COMMUNITY): Payer: BLUE CROSS/BLUE SHIELD

## 2016-11-14 DIAGNOSIS — J40 Bronchitis, not specified as acute or chronic: Secondary | ICD-10-CM | POA: Diagnosis not present

## 2016-11-14 LAB — CBC WITH DIFFERENTIAL/PLATELET
Basophils Absolute: 0.1 10*3/uL (ref 0.0–0.1)
Basophils Relative: 1 %
Eosinophils Absolute: 0.2 10*3/uL (ref 0.0–0.7)
Eosinophils Relative: 3 %
HCT: 42 % (ref 36.0–46.0)
Hemoglobin: 13.7 g/dL (ref 12.0–15.0)
Lymphocytes Relative: 36 %
Lymphs Abs: 2.7 10*3/uL (ref 0.7–4.0)
MCH: 27.8 pg (ref 26.0–34.0)
MCHC: 32.6 g/dL (ref 30.0–36.0)
MCV: 85.4 fL (ref 78.0–100.0)
Monocytes Absolute: 1.1 10*3/uL — ABNORMAL HIGH (ref 0.1–1.0)
Monocytes Relative: 15 %
Neutro Abs: 3.4 10*3/uL (ref 1.7–7.7)
Neutrophils Relative %: 45 %
Platelets: 213 10*3/uL (ref 150–400)
RBC: 4.92 MIL/uL (ref 3.87–5.11)
RDW: 14.2 % (ref 11.5–15.5)
WBC: 7.5 10*3/uL (ref 4.0–10.5)

## 2016-11-14 LAB — BASIC METABOLIC PANEL
Anion gap: 9 (ref 5–15)
BUN: 17 mg/dL (ref 6–20)
CO2: 24 mmol/L (ref 22–32)
Calcium: 9.7 mg/dL (ref 8.9–10.3)
Chloride: 105 mmol/L (ref 101–111)
Creatinine, Ser: 0.98 mg/dL (ref 0.44–1.00)
GFR calc Af Amer: >60 mL/min (ref >60)
GFR calc non Af Amer: 58 mL/min — ABNORMAL LOW (ref >60)
Glucose, Bld: 102 mg/dL — ABNORMAL HIGH (ref 65–99)
Potassium: 4.4 mmol/L (ref 3.5–5.1)
Sodium: 138 mmol/L (ref 135–145)

## 2016-11-14 MED ORDER — ALBUTEROL SULFATE HFA 108 (90 BASE) MCG/ACT IN AERS: 1.0000 | INHALATION_SPRAY | Freq: Four times a day (QID) | RESPIRATORY_TRACT | 0 refills | Status: DC | PRN

## 2016-11-14 MED ORDER — GUAIFENESIN 100 MG/5ML PO SOLN
5.0000 mL | Freq: Once | ORAL | Status: AC
  Administered 2016-11-14: 100 mg via ORAL
  Filled 2016-11-14: qty 5

## 2016-11-14 MED ORDER — BENZONATATE 100 MG PO CAPS
200.0000 mg | ORAL_CAPSULE | Freq: Once | ORAL | Status: AC
  Administered 2016-11-14: 200 mg via ORAL
  Filled 2016-11-14: qty 2

## 2016-11-14 MED ORDER — PREDNISONE 20 MG PO TABS: ORAL_TABLET | ORAL | 0 refills | Status: DC

## 2016-11-14 MED ORDER — METHYLPREDNISOLONE SODIUM SUCC 125 MG IJ SOLR
125.0000 mg | Freq: Once | INTRAMUSCULAR | Status: AC
  Administered 2016-11-14: 125 mg via INTRAVENOUS
  Filled 2016-11-14: qty 2

## 2016-11-14 MED ORDER — IPRATROPIUM-ALBUTEROL 0.5-2.5 (3) MG/3ML IN SOLN
3.0000 mL | Freq: Once | RESPIRATORY_TRACT | Status: AC
  Administered 2016-11-14: 3 mL via RESPIRATORY_TRACT
  Filled 2016-11-14: qty 3

## 2016-11-14 MED ORDER — ALBUTEROL SULFATE (2.5 MG/3ML) 0.083% IN NEBU
5.0000 mg | INHALATION_SOLUTION | Freq: Once | RESPIRATORY_TRACT | Status: AC
  Administered 2016-11-14: 5 mg via RESPIRATORY_TRACT
  Filled 2016-11-14: qty 6

## 2016-11-14 MED ORDER — GUAIFENESIN ER 1200 MG PO TB12: 1.0000 | ORAL_TABLET | Freq: Two times a day (BID) | ORAL | 0 refills | Status: DC

## 2016-11-14 MED ORDER — BENZONATATE 200 MG PO CAPS: 200.0000 mg | ORAL_CAPSULE | Freq: Three times a day (TID) | ORAL | 0 refills | Status: DC

## 2017-04-13 ENCOUNTER — Emergency Department (HOSPITAL_COMMUNITY): Payer: BLUE CROSS/BLUE SHIELD

## 2017-04-13 ENCOUNTER — Emergency Department (HOSPITAL_COMMUNITY)
Admission: EM | Admit: 2017-04-13 | Discharge: 2017-04-13 | Disposition: A | Discharge status: Home / Self Care | Payer: BLUE CROSS/BLUE SHIELD | Attending: Emergency Medicine | Admitting: Emergency Medicine

## 2017-04-13 DIAGNOSIS — Z79899 Other long term (current) drug therapy: Secondary | ICD-10-CM | POA: Insufficient documentation

## 2017-04-13 DIAGNOSIS — Y939 Activity, unspecified: Secondary | ICD-10-CM | POA: Insufficient documentation

## 2017-04-13 DIAGNOSIS — Z87891 Personal history of nicotine dependence: Secondary | ICD-10-CM | POA: Insufficient documentation

## 2017-04-13 DIAGNOSIS — S52531A Colles' fracture of right radius, initial encounter for closed fracture: Secondary | ICD-10-CM | POA: Diagnosis not present

## 2017-04-13 DIAGNOSIS — S6991XA Unspecified injury of right wrist, hand and finger(s), initial encounter: Secondary | ICD-10-CM | POA: Diagnosis present

## 2017-04-13 DIAGNOSIS — Y999 Unspecified external cause status: Secondary | ICD-10-CM | POA: Insufficient documentation

## 2017-04-13 DIAGNOSIS — Y92007 Garden or yard of unspecified non-institutional (private) residence as the place of occurrence of the external cause: Secondary | ICD-10-CM | POA: Diagnosis not present

## 2017-04-13 DIAGNOSIS — W010XXA Fall on same level from slipping, tripping and stumbling without subsequent striking against object, initial encounter: Secondary | ICD-10-CM | POA: Diagnosis not present

## 2017-04-13 MED ORDER — ONDANSETRON HCL 4 MG/2ML IJ SOLN: 4.0000 mg | Freq: Once | INTRAMUSCULAR | Status: DC

## 2017-04-13 MED ORDER — ONDANSETRON 4 MG PO TBDP
4.0000 mg | ORAL_TABLET | Freq: Once | ORAL | Status: AC
  Administered 2017-04-13: 4 mg via ORAL
  Filled 2017-04-13: qty 1

## 2017-04-13 MED ORDER — IBUPROFEN 600 MG PO TABS: 600.0000 mg | ORAL_TABLET | Freq: Four times a day (QID) | ORAL | 0 refills | Status: DC | PRN

## 2017-04-13 MED ORDER — FENTANYL CITRATE (PF) 100 MCG/2ML IJ SOLN: 50.0000 ug | Freq: Once | INTRAMUSCULAR | Status: DC

## 2017-04-13 MED ORDER — OXYCODONE-ACETAMINOPHEN 5-325 MG PO TABS: 1.0000 | ORAL_TABLET | ORAL | 0 refills | Status: DC | PRN

## 2017-04-13 MED ORDER — MORPHINE SULFATE (PF) 4 MG/ML IV SOLN
4.0000 mg | Freq: Once | INTRAVENOUS | Status: AC
  Administered 2017-04-13: 4 mg via INTRAMUSCULAR
  Filled 2017-04-13: qty 1
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
## 2017-04-13 NOTE — ED Notes (Addendum)
Patient transported to XRAY 

## 2017-04-13 NOTE — ED Notes (Signed)
SLING INTACT TO RUE. ICE PACK GIVEN AT DISCHARGE

## 2017-04-13 NOTE — ED Notes (Signed)
Bed: [facility] Expected date:  Expected time:  Means of arrival:  Comments: EMS 70y.o wrist deformity
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
## 2018-11-01 ENCOUNTER — Other Ambulatory Visit: Payer: Self-pay | Admitting: Obstetrics and Gynecology

## 2019-06-20 ENCOUNTER — Other Ambulatory Visit: Payer: Self-pay | Admitting: Internal Medicine

## 2019-06-20 DIAGNOSIS — Z20822 Contact with and (suspected) exposure to covid-19: Secondary | ICD-10-CM

## 2019-06-24 LAB — NOVEL CORONAVIRUS, NAA: SARS-CoV-2, NAA: NOT DETECTED

## 2019-06-27 ENCOUNTER — Telehealth: Payer: Self-pay | Admitting: Hematology

## 2019-06-27 NOTE — Telephone Encounter (Signed)
Pt is aware covid 19 results in negative

## 2019-09-18 ENCOUNTER — Other Ambulatory Visit: Payer: Self-pay | Admitting: Internal Medicine

## 2019-09-18 DIAGNOSIS — E2839 Other primary ovarian failure: Secondary | ICD-10-CM

## 2019-09-18 DIAGNOSIS — Z1231 Encounter for screening mammogram for malignant neoplasm of breast: Secondary | ICD-10-CM

## 2019-10-31 ENCOUNTER — Ambulatory Visit: Payer: Self-pay

## 2019-11-28 NOTE — Progress Notes (Signed)
Patient referred by Willey Blade, MD for hyperlipidemia  Subjective:   Elizabeth Benton, female    DOB: 14-Aug-1946, 73 y.o.   MRN: [id number]   Chief Complaint  Patient presents with  . Hyperlipidemia  . Abnormal ECG    HPI  73 y.o. African American female with hypertension, hyperlipidemia  Patient denies chest pain, palpitations, leg edema, orthopnea, PND, TIA/syncope.She has been on fenofibrate for 6 months. Her physical activity is limited due to back pain..She has mild stable exertional dyspnea. She has arm pain with computer activity, not with exertion.    Past Medical History:  Diagnosis Date  . Depression   . H/O migraine   . HSV-2 (herpes simplex virus 2) infection   . Hyperlipidemia   . Lichen sclerosus   . Monilia infection 2006  . Vaginal atrophy 2009  . Vaginitis and vulvovaginitis 2006     Past Surgical History:  Procedure Laterality Date  . ABDOMINAL HYSTERECTOMY  1987  . DILATION AND CURETTAGE OF UTERUS    . TONSILLECTOMY    . TUBAL LIGATION    . WISDOM TOOTH EXTRACTION       Social History   Tobacco Use  Smoking Status Former Smoker  . Quit date: 01/22/1997  . Years since quitting: 22.8    Social History   Substance and Sexual Activity  Alcohol Use Yes  . Alcohol/week: 1.0 standard drinks  . Types: 1 Standard drinks or equivalent per week     Family History  Problem Relation Age of Onset  . Stroke Mother   . Cancer Father        Throat Cancer     Current Outpatient Medications on File Prior to Visit  Medication Sig Dispense Refill  . acetaminophen (TYLENOL) 500 MG tablet Take 1,000 mg by mouth daily as needed (pain).    . Cholecalciferol ([employer] VIT D 5000 HIGH-POTENCY PO) Take by mouth.    . hydrOXYzine (ATARAX/VISTARIL) 10 MG tablet Take 10 mg by mouth daily.    . nabumetone (RELAFEN) 750 MG tablet Take 750 mg by mouth daily.     No current facility-administered medications on file prior to visit.     Cardiovascular  and other pertinent studies:  EKG 10/04/2019: Sinus rhythm. LBBB.  Recent labs: 10/04/2019: Glucose 94, BUN/Cr 12/0.75. EGFR 80. Na/K 141/0.4.  AST/ALT 41/47, mildly elevated  H/H 14/41. MCV 84. Platelets 249 HbA1C 5.8% Chol 216, TG 127, HDL 59, LDL 134 Apolipoprotein B 105 (<90)   Review of Systems  Constitution: Negative for decreased appetite, malaise/fatigue, weight gain and weight loss.  HENT: Negative for congestion.   Eyes: Negative for visual disturbance.  Cardiovascular: Negative for chest pain, dyspnea on exertion, leg swelling, palpitations and syncope.  Respiratory: Negative for cough.   Endocrine: Negative for cold intolerance.  Hematologic/Lymphatic: Does not bruise/bleed easily.  Skin: Negative for itching and rash.  Musculoskeletal: Negative for myalgias.  Gastrointestinal: Negative for abdominal pain, nausea and vomiting.  Genitourinary: Negative for dysuria.  Neurological: Negative for dizziness and weakness.  Psychiatric/Behavioral: The patient is not nervous/anxious.   All other systems reviewed and are negative.        Vitals:   11/30/19 1337 11/30/19 1347  BP: (!) 169/97 (!) 149/75  Pulse: 88 77  Temp: 98 F (36.7 C)   SpO2: 96% 95%     Body mass index is 28.75 kg/m. Filed Weights   11/30/19 1337  Weight: 147 lb 3.2 oz (66.8 kg)     Objective:  Physical Exam  Constitutional: She is oriented to person, place, and time. She appears well-developed and well-nourished. No distress.  HENT:  Head: Normocephalic and atraumatic.  Eyes: Pupils are equal, round, and reactive to light. Conjunctivae are normal.  Neck: No JVD present.  Cardiovascular: Normal rate, regular rhythm and intact distal pulses.  No murmur heard. Pulmonary/Chest: Effort normal and breath sounds normal. She has no wheezes. She has no rales.  Abdominal: Soft. Bowel sounds are normal. There is no rebound.  Musculoskeletal:        General: No edema.  Lymphadenopathy:     She has no cervical adenopathy.  Neurological: She is alert and oriented to person, place, and time. No cranial nerve deficit.  Skin: Skin is warm and dry.  Psychiatric: She has a normal mood and affect.  Nursing note and vitals reviewed.        Assessment & Recommendations:   73 y.o. African American female with hypertension, hyperlipidemia  Hypertension: Stage 1. Started amlodipine 5 mg daily.  Mixed hyperlipidemia 10 yr ASCVD risk 10.5%. Stop fenofibrate. Start crestor 20 mg daily Lipid panel in 3 months.  Exertional dyspnea: Normal exam. LBBB on EKG. Will obtain echocardiogram.   Thank you for referring the patient to [place]. Please feel free to contact with any questions.  Nigel Mormon, MD [facility] Pager: [phone] Office: [phone]

## 2019-11-30 ENCOUNTER — Ambulatory Visit (INDEPENDENT_AMBULATORY_CARE_PROVIDER_SITE_OTHER): Payer: BC Managed Care – PPO | Admitting: Cardiology

## 2019-11-30 ENCOUNTER — Encounter: Payer: Self-pay | Admitting: Cardiology

## 2019-11-30 ENCOUNTER — Other Ambulatory Visit: Payer: Self-pay

## 2019-11-30 VITALS — BP 149/75 | HR 77 | Temp 98.0°F | Ht 60.0 in | Wt 147.2 lb

## 2019-11-30 DIAGNOSIS — E782 Mixed hyperlipidemia: Secondary | ICD-10-CM | POA: Diagnosis not present

## 2019-11-30 DIAGNOSIS — I447 Left bundle-branch block, unspecified: Secondary | ICD-10-CM

## 2019-11-30 DIAGNOSIS — R0600 Dyspnea, unspecified: Secondary | ICD-10-CM

## 2019-11-30 DIAGNOSIS — R0609 Other forms of dyspnea: Secondary | ICD-10-CM | POA: Diagnosis not present

## 2019-11-30 DIAGNOSIS — I10 Essential (primary) hypertension: Secondary | ICD-10-CM

## 2019-11-30 MED ORDER — ROSUVASTATIN CALCIUM 20 MG PO TABS: 20.0000 mg | ORAL_TABLET | Freq: Every day | ORAL | 3 refills | Status: DC

## 2019-11-30 MED ORDER — AMLODIPINE BESYLATE 5 MG PO TABS: 5.0000 mg | ORAL_TABLET | Freq: Every day | ORAL | 3 refills | Status: DC

## 2019-12-05 ENCOUNTER — Ambulatory Visit
Admission: RE | Admit: 2019-12-05 | Discharge: 2019-12-05 | Disposition: A | Discharge status: Home / Self Care | Payer: Medicare Other | Source: Ambulatory Visit | Attending: Internal Medicine | Admitting: Internal Medicine

## 2019-12-05 ENCOUNTER — Other Ambulatory Visit: Payer: Self-pay

## 2019-12-05 DIAGNOSIS — E2839 Other primary ovarian failure: Secondary | ICD-10-CM

## 2020-01-01 ENCOUNTER — Ambulatory Visit: Payer: Self-pay

## 2020-01-21 ENCOUNTER — Ambulatory Visit: Payer: Medicare Other | Attending: Internal Medicine

## 2020-01-21 ENCOUNTER — Other Ambulatory Visit: Payer: Self-pay

## 2020-01-21 DIAGNOSIS — Z23 Encounter for immunization: Secondary | ICD-10-CM | POA: Insufficient documentation

## 2020-01-21 NOTE — Progress Notes (Signed)
   Covid-19 Vaccination Clinic  Name:  JUSTIS ATEN    MRN: [id number] DOB: 12-13-1946  01/21/2020  Ms. Piech was observed post Covid-19 immunization for 15 minutes without incidence. She was provided with Vaccine Information Sheet and instruction to access the V-Safe system.   Ms. Gatta was instructed to call 911 with any severe reactions post vaccine: Marland Kitchen Difficulty breathing  . Swelling of your face and throat  . A fast heartbeat  . A bad rash all over your body  . Dizziness and weakness    Immunizations Administered    Name Date Dose VIS Date Route   Moderna COVID-19 Vaccine 01/21/2020  1:33 PM 0.5 mL 11/28/2019 Intramuscular   Manufacturer: Moderna   Lot: [id number]   [id number]

## 2020-02-08 ENCOUNTER — Ambulatory Visit: Payer: Self-pay

## 2020-02-18 ENCOUNTER — Ambulatory Visit: Payer: Medicare Other | Attending: Internal Medicine

## 2020-02-18 DIAGNOSIS — Z23 Encounter for immunization: Secondary | ICD-10-CM

## 2020-02-18 NOTE — Progress Notes (Signed)
   Covid-19 Vaccination Clinic  Name:  Elizabeth Benton    MRN: [id number] DOB: March 28, 1946  02/18/2020  Elizabeth Benton was observed post Covid-19 immunization for 15 minutes without incidence. She was provided with Vaccine Information Sheet and instruction to access the V-Safe system.   Elizabeth Benton was instructed to call 911 with any severe reactions post vaccine: Marland Kitchen Difficulty breathing  . Swelling of your face and throat  . A fast heartbeat  . A bad rash all over your body  . Dizziness and weakness    Immunizations Administered    Name Date Dose VIS Date Route   Moderna COVID-19 Vaccine 02/18/2020 11:55 AM 0.5 mL 11/28/2019 Intramuscular   Manufacturer: Moderna   Lot: [id number]   [id number]

## 2020-02-29 LAB — LIPID PANEL
Chol/HDL Ratio: 2.4 ratio (ref 0.0–4.4)
Cholesterol, Total: 154 mg/dL (ref 100–199)
HDL: 64 mg/dL (ref >39)
LDL Chol Calc (NIH): 74 mg/dL (ref 0–99)
Triglycerides: 86 mg/dL (ref 0–149)
VLDL Cholesterol Cal: 16 mg/dL (ref 5–40)

## 2020-03-06 ENCOUNTER — Ambulatory Visit
Admission: RE | Admit: 2020-03-06 | Discharge: 2020-03-06 | Disposition: A | Discharge status: Home / Self Care | Payer: Medicare Other | Source: Ambulatory Visit | Attending: Internal Medicine | Admitting: Internal Medicine

## 2020-03-06 ENCOUNTER — Other Ambulatory Visit: Payer: Self-pay

## 2020-03-06 DIAGNOSIS — Z1231 Encounter for screening mammogram for malignant neoplasm of breast: Secondary | ICD-10-CM

## 2020-03-07 ENCOUNTER — Other Ambulatory Visit: Payer: Self-pay | Admitting: Internal Medicine

## 2020-03-07 DIAGNOSIS — R928 Other abnormal and inconclusive findings on diagnostic imaging of breast: Secondary | ICD-10-CM

## 2020-03-11 ENCOUNTER — Other Ambulatory Visit: Payer: Self-pay

## 2020-03-11 ENCOUNTER — Encounter: Payer: Self-pay | Admitting: Cardiology

## 2020-03-11 ENCOUNTER — Ambulatory Visit: Payer: Medicare Other | Admitting: Cardiology

## 2020-03-11 VITALS — BP 129/82 | HR 82 | Temp 98.0°F | Ht 60.0 in | Wt 143.0 lb

## 2020-03-11 DIAGNOSIS — E782 Mixed hyperlipidemia: Secondary | ICD-10-CM

## 2020-03-11 DIAGNOSIS — I447 Left bundle-branch block, unspecified: Secondary | ICD-10-CM

## 2020-03-11 DIAGNOSIS — I10 Essential (primary) hypertension: Secondary | ICD-10-CM

## 2020-03-11 MED ORDER — ROSUVASTATIN CALCIUM 20 MG PO TABS: 20.0000 mg | ORAL_TABLET | Freq: Every day | ORAL | 3 refills | Status: DC

## 2020-03-11 MED ORDER — AMLODIPINE BESYLATE 5 MG PO TABS: 5.0000 mg | ORAL_TABLET | Freq: Every day | ORAL | 3 refills | Status: DC

## 2020-03-11 NOTE — Progress Notes (Signed)
    Patient referred by Willey Blade, MD for hyperlipidemia  Subjective:   Elizabeth Benton, female    DOB: Sep 12, 1946, 74 y.o.   MRN: [id number]   Chief Complaint  Patient presents with  . Hyperlipidemia  . Follow-up    3 month  . Results    HPI  74 y.o. African American female with hypertension, hyperlipidemia.  Lipid panel has improved significantly on Crestor, details below.  Patient denies chest pain, palpitations, leg edema, orthopnea, PND, TIA/syncope.She has been on fenofibrate for 6 months. Her physical activity is limited due to back pain..She has mild stable exertional dyspnea.   Current Outpatient Medications on File Prior to Visit  Medication Sig Dispense Refill  . acetaminophen (TYLENOL) 500 MG tablet Take 1,000 mg by mouth daily as needed (pain).    Marland Kitchen amLODipine (NORVASC) 5 MG tablet Take 1 tablet (5 mg total) by mouth daily. 180 tablet 3  . Cholecalciferol (CVS VIT D 5000 HIGH-POTENCY PO) Take by mouth.    . hydrOXYzine (ATARAX/VISTARIL) 10 MG tablet Take 10 mg by mouth daily.    . nabumetone (RELAFEN) 750 MG tablet Take 750 mg by mouth daily.    . rosuvastatin (CRESTOR) 20 MG tablet Take 1 tablet (20 mg total) by mouth daily. 90 tablet 3   No current facility-administered medications on file prior to visit.    Cardiovascular and other pertinent studies:  EKG 03/11/2020: Sinus rhythm 76 bpm. Left bundle branch block.  Recent labs: 02/28/2020: Chol 154, TG 86, HDL 64, LDL 74  10/04/2019: Glucose 94, BUN/Cr 12/0.75. EGFR 80. Na/K 141/0.4.  AST/ALT 41/47, mildly elevated  H/H 14/41. MCV 84. Platelets 249 HbA1C 5.8% Chol 216, TG 127, HDL 59, LDL 134 Apolipoprotein B 105 (<90)   Review of Systems  Cardiovascular: Negative for chest pain, dyspnea on exertion, leg swelling, palpitations and syncope.         Vitals:   03/11/20 1428  BP: 129/82  Pulse: 82  Temp: 98 F (36.7 C)  SpO2: 98%     Body mass index is 27.93 kg/m. Filed Weights   03/11/20 1428  Weight: 143 lb (64.9 kg)     Objective:   Physical Exam  Constitutional: She appears well-developed and well-nourished.  Neck: No JVD present.  Cardiovascular: Normal rate, regular rhythm, normal heart sounds and intact distal pulses.  No murmur heard. Pulmonary/Chest: Effort normal and breath sounds normal. She has no wheezes. She has no rales.  Musculoskeletal:        General: No edema.  Nursing note and vitals reviewed.        Assessment & Recommendations:   74 y.o. African American female with hypertension, hyperlipidemia  Hypertension: Controlled with amlodipine 5 mg daily.  Mixed hyperlipidemia Excellent improvement on Crestor 10 mg daily, continue the same.  Exertional dyspnea: Normal exam. LBBB on EKG. Will obtain echocardiogram.   F/u in 1 year.  Nigel Mormon, MD [facility] Pager: [phone] Office: [phone]

## 2020-03-19 ENCOUNTER — Ambulatory Visit: Payer: Medicare Other

## 2020-03-19 ENCOUNTER — Other Ambulatory Visit: Payer: Self-pay

## 2020-03-19 DIAGNOSIS — I447 Left bundle-branch block, unspecified: Secondary | ICD-10-CM

## 2020-04-04 ENCOUNTER — Ambulatory Visit
Admission: RE | Admit: 2020-04-04 | Discharge: 2020-04-04 | Disposition: A | Discharge status: Home / Self Care | Payer: Medicare Other | Source: Ambulatory Visit | Attending: Internal Medicine | Admitting: Internal Medicine

## 2020-04-04 ENCOUNTER — Other Ambulatory Visit: Payer: Self-pay

## 2020-04-04 DIAGNOSIS — R928 Other abnormal and inconclusive findings on diagnostic imaging of breast: Secondary | ICD-10-CM

## 2020-08-23 ENCOUNTER — Other Ambulatory Visit: Payer: Medicare Other

## 2020-08-23 ENCOUNTER — Other Ambulatory Visit: Payer: Self-pay

## 2020-08-23 DIAGNOSIS — Z20822 Contact with and (suspected) exposure to covid-19: Secondary | ICD-10-CM

## 2020-08-25 LAB — NOVEL CORONAVIRUS, NAA: SARS-CoV-2, NAA: NOT DETECTED

## 2020-08-25 LAB — SARS-COV-2, NAA 2 DAY TAT

## 2020-10-25 ENCOUNTER — Ambulatory Visit: Payer: Medicare Other | Attending: Internal Medicine

## 2020-10-25 DIAGNOSIS — Z23 Encounter for immunization: Secondary | ICD-10-CM

## 2020-10-25 NOTE — Progress Notes (Signed)
   Covid-19 Vaccination Clinic  Name:  Elizabeth Benton    MRN: [id number] DOB: 10-31-1946  10/25/2020  [name] was observed post Covid-19 immunization for 30 minutes based on pre-vaccination screening without incident. She was provided with Vaccine Information Sheet and instruction to access the V-Safe system.   Ms. Kappes was instructed to call 911 with any severe reactions post vaccine: Marland Kitchen Difficulty breathing  . Swelling of face and throat  . A fast heartbeat  . A bad rash all over body  . Dizziness and weakness

## 2020-12-05 ENCOUNTER — Ambulatory Visit (INDEPENDENT_AMBULATORY_CARE_PROVIDER_SITE_OTHER): Payer: BC Managed Care – PPO | Admitting: Sports Medicine

## 2020-12-05 ENCOUNTER — Ambulatory Visit (INDEPENDENT_AMBULATORY_CARE_PROVIDER_SITE_OTHER): Payer: BC Managed Care – PPO

## 2020-12-05 ENCOUNTER — Encounter: Payer: Self-pay | Admitting: Sports Medicine

## 2020-12-05 ENCOUNTER — Other Ambulatory Visit: Payer: Self-pay

## 2020-12-05 ENCOUNTER — Other Ambulatory Visit: Payer: Self-pay | Admitting: Sports Medicine

## 2020-12-05 DIAGNOSIS — M5440 Lumbago with sciatica, unspecified side: Secondary | ICD-10-CM | POA: Insufficient documentation

## 2020-12-05 DIAGNOSIS — M79671 Pain in right foot: Secondary | ICD-10-CM

## 2020-12-05 DIAGNOSIS — M7751 Other enthesopathy of right foot: Secondary | ICD-10-CM

## 2020-12-05 DIAGNOSIS — Z1211 Encounter for screening for malignant neoplasm of colon: Secondary | ICD-10-CM | POA: Insufficient documentation

## 2020-12-05 DIAGNOSIS — M7750 Other enthesopathy of unspecified foot: Secondary | ICD-10-CM

## 2020-12-05 DIAGNOSIS — M8580 Other specified disorders of bone density and structure, unspecified site: Secondary | ICD-10-CM | POA: Insufficient documentation

## 2020-12-05 DIAGNOSIS — R7309 Other abnormal glucose: Secondary | ICD-10-CM | POA: Insufficient documentation

## 2020-12-05 DIAGNOSIS — M25571 Pain in right ankle and joints of right foot: Secondary | ICD-10-CM | POA: Diagnosis not present

## 2020-12-05 DIAGNOSIS — K5900 Constipation, unspecified: Secondary | ICD-10-CM | POA: Insufficient documentation

## 2020-12-05 DIAGNOSIS — E559 Vitamin D deficiency, unspecified: Secondary | ICD-10-CM | POA: Insufficient documentation

## 2020-12-05 DIAGNOSIS — M779 Enthesopathy, unspecified: Secondary | ICD-10-CM | POA: Diagnosis not present

## 2020-12-05 DIAGNOSIS — E663 Overweight: Secondary | ICD-10-CM | POA: Insufficient documentation

## 2020-12-05 MED ORDER — METHYLPREDNISOLONE 4 MG PO TBPK: ORAL_TABLET | ORAL | 0 refills | Status: DC

## 2020-12-05 NOTE — Progress Notes (Addendum)
Subjective: Elizabeth Benton is a 74 y.o. female patient who presents to office for evaluation of right foot pain. Patient complains of pain in the right foot and ankle that radiates up the lateral side and to the leg.  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  Patient denies injury/trip/fall/sprain/any causative factors.   Review of systems noncontributory.  Patient Active Problem List   Diagnosis Date Noted  . Abnormal glucose level 12/05/2020  . Constipation 12/05/2020  . Lumbago with sciatica 12/05/2020  . Osteopenia 12/05/2020  . Overweight 12/05/2020  . Screening for malignant neoplasm of colon 12/05/2020  . Vitamin D deficiency 12/05/2020  . Essential hypertension 11/30/2019  . LBBB (left bundle branch block) 11/30/2019  . Exertional dyspnea 11/30/2019  . Lichen sclerosus 28/41/3244  . Mixed hyperlipidemia   . HSV-2 (herpes simplex virus 2) infection   . Depression   . H/O migraine   . Monilia infection   . Vaginal atrophy   . Wears glasses 06/23/2011  . Sinus problem/runny nose 06/23/2011  . Mass on back 06/23/2011    Current Outpatient Medications on File Prior to Visit  Medication Sig Dispense Refill  . aspirin 81 MG chewable tablet Chew 1 tablet every day by oral route.    . Pseudoephedrine-Naproxen Na (ALEVE-D SINUS & COLD PO) Take 1 capsule by mouth daily.    Marland Kitchen acetaminophen (TYLENOL) 500 MG tablet Take 1,000 mg by mouth daily as needed (pain).    Marland Kitchen amLODipine (NORVASC) 5 MG tablet Take 1 tablet (5 mg total) by mouth daily. 90 tablet 3  . betamethasone dipropionate 0.05 % cream APPLY SPARINGLY TO AFFECTED AREA TWICE A DAY    . Cholecalciferol (CVS VIT D 5000 HIGH-POTENCY PO) Take by mouth.    . clobetasol cream (TEMOVATE) 0.10 % 1 (ONE) APPLICATION TWO TIMES DAILY X TWO WEEKS, THEN EVERY OTHER DAY X 2 WEEK THEN MWF X  2 WEEKS    . clotrimazole-betamethasone (LOTRISONE) cream APPLY TO AFFECTED AREA AND SURROUNDING SKIN 2 TIMES A DAY IN MORNING AND EVENING FOR 2 WEEKS    . fenofibrate 160 MG tablet fenofibrate 160 mg tablet  TAKE 1 TABLET BY MOUTH EVERY DAY    . hydrocortisone 2.5 % cream APPLY TO AFFECTED AREAS TWICE DAILY ON FACE 1 WEEK ON, 1 WEEK OFF AS NEEDED FOR ITCHING    . hydrOXYzine (ATARAX/VISTARIL) 10 MG tablet Take 10 mg by mouth daily.    . nabumetone (RELAFEN) 750 MG tablet Take 750 mg by mouth daily.    Marland Kitchen nystatin-triamcinolone ointment (MYCOLOG) 1 (ONE) APPLICATION TWO TIMES DAILY X10 DAYS AS NEEDED    . rosuvastatin (CRESTOR) 20 MG tablet Take 1 tablet (20 mg total) by mouth daily. 90 tablet 3  . triamcinolone (KENALOG) 0.025 % cream APPLY SPARINGLY TO AFFECTED AREA TWICE A DAY    . triamcinolone (KENALOG) 0.1 % APPLY TO AFFECTED AREAS TWICE DAILY FOR 2 WEEKS THEN AS NEEDED, WHEN FLARING     No current facility-administered medications on file prior to visit.    Allergies  Allergen Reactions  . Contrast Media [Iodinated Diagnostic Agents] Anaphylaxis  . Demerol Other (See Comments)    Patient stated she will pass out, unaware of surroundings. Last time she received it (for surgery) her stomach had to be pumped.  . Iodine Anaphylaxis  .  Shellfish Allergy Shortness Of Breath and Swelling    Objective:  General: Alert and oriented x3 in no acute distress  Dermatology: No open lesions bilateral lower extremities, no webspace macerations, no ecchymosis bilateral, all nails x 10 are well manicured.  Vascular: Dorsalis Pedis and Posterior Tibial pedal pulses palpable, Capillary Fill Time 3 seconds,(+) pedal hair growth bilateral, minimal trace edema to the right ankle.  Neurology: Johney Maine sensation intact via light touch bilateral.  Musculoskeletal: Mild to moderate tenderness with palpation at right peroneal tendon course greater than the midfoot and ankle and heel on right.  There is mild  increase in pain with inversion of foot.  Strength within normal limits in all groups bilateral.   Gait: Antalgic gait  Xrays  Right foot   Impression: Decreased first metatarsophalangeal joint space and hypertrophic first and  fifth metatarsal head consistent with bunion pain. No fracture or dislocation. No other issues noted.   Assessment and Plan: Problem List Items Addressed This Visit   None   Visit Diagnoses    Pain in right foot    -  Primary   Relevant Orders   DG Foot Complete Right   Right ankle pain, unspecified chronicity       Tendonitis       Capsulitis          -Complete examination performed -Xrays reviewed -Discussed treatment options for tendinitis -Rx Medrol Dosepak to take as instructed -Dispensed ankle gauntlet for patient to use as instructed -Recommend rest ice elevation daily until symptoms improve and to continue with good supportive tennis shoes -Patient to return to office 1 month or sooner if condition worsens.  Landis Martins, DPM

## 2021-01-16 ENCOUNTER — Ambulatory Visit: Payer: Medicare Other | Admitting: Sports Medicine

## 2021-02-06 ENCOUNTER — Ambulatory Visit: Payer: Medicare Other | Admitting: Sports Medicine

## 2021-03-07 ENCOUNTER — Ambulatory Visit: Payer: BC Managed Care – PPO | Admitting: Cardiology

## 2021-03-14 ENCOUNTER — Ambulatory Visit: Payer: BC Managed Care – PPO | Admitting: Cardiology

## 2021-03-26 ENCOUNTER — Encounter: Payer: Self-pay | Admitting: Cardiology

## 2021-03-26 ENCOUNTER — Ambulatory Visit: Payer: Medicare Other | Admitting: Cardiology

## 2021-03-26 ENCOUNTER — Other Ambulatory Visit: Payer: Self-pay

## 2021-03-26 VITALS — BP 131/73 | HR 92 | Temp 98.0°F | Ht 60.0 in | Wt 141.0 lb

## 2021-03-26 DIAGNOSIS — R0609 Other forms of dyspnea: Secondary | ICD-10-CM

## 2021-03-26 DIAGNOSIS — I10 Essential (primary) hypertension: Secondary | ICD-10-CM

## 2021-03-26 DIAGNOSIS — E782 Mixed hyperlipidemia: Secondary | ICD-10-CM

## 2021-03-26 DIAGNOSIS — R0600 Dyspnea, unspecified: Secondary | ICD-10-CM

## 2021-03-26 NOTE — Progress Notes (Signed)
Patient referred by Willey Blade, MD for hyperlipidemia  Subjective:   Elizabeth Benton, female    DOB: November 04, 1946, 75 y.o.   MRN: [id number]   Chief Complaint  Patient presents with  . Hyperlipidemia  . Follow-up  . Arm weakness    HPI  75 y.o. African American female with hypertension, hyperlipidemia.  Patient is here for 1 year follow up. She has not been as physically active through the winter months. She has noticed some exertional dyspnea on climbing up the hill, but denies any chest pain.   Current Outpatient Medications on File Prior to Visit  Medication Sig Dispense Refill  . acetaminophen (TYLENOL) 500 MG tablet Take 1,000 mg by mouth daily as needed (pain).    Marland Kitchen amLODipine (NORVASC) 5 MG tablet Take 1 tablet (5 mg total) by mouth daily. 90 tablet 3  . aspirin 81 MG chewable tablet Chew 1 tablet every day by oral route.    . betamethasone dipropionate 0.05 % cream APPLY SPARINGLY TO AFFECTED AREA TWICE A DAY    . Cholecalciferol (CVS VIT D 5000 HIGH-POTENCY PO) Take by mouth.    . clobetasol cream (TEMOVATE) 7.82 % 1 (ONE) APPLICATION TWO TIMES DAILY X TWO WEEKS, THEN EVERY OTHER DAY X 2 WEEK THEN MWF X 2 WEEKS    . clotrimazole-betamethasone (LOTRISONE) cream APPLY TO AFFECTED AREA AND SURROUNDING SKIN 2 TIMES A DAY IN MORNING AND EVENING FOR 2 WEEKS    . fenofibrate 160 MG tablet fenofibrate 160 mg tablet  TAKE 1 TABLET BY MOUTH EVERY DAY    . hydrocortisone 2.5 % cream APPLY TO AFFECTED AREAS TWICE DAILY ON FACE 1 WEEK ON, 1 WEEK OFF AS NEEDED FOR ITCHING    . hydrOXYzine (ATARAX/VISTARIL) 10 MG tablet Take 10 mg by mouth daily.    . methylPREDNISolone (MEDROL DOSEPAK) 4 MG TBPK tablet Take as directed 21 tablet 0  . nabumetone (RELAFEN) 750 MG tablet Take 750 mg by mouth daily.    Marland Kitchen nystatin-triamcinolone ointment (MYCOLOG) 1 (ONE) APPLICATION TWO TIMES DAILY X10 DAYS AS NEEDED    . Pseudoephedrine-Naproxen Na (ALEVE-D SINUS & COLD PO) Take 1 capsule by  mouth daily.    . rosuvastatin (CRESTOR) 20 MG tablet Take 1 tablet (20 mg total) by mouth daily. 90 tablet 3  . triamcinolone (KENALOG) 0.025 % cream APPLY SPARINGLY TO AFFECTED AREA TWICE A DAY    . triamcinolone (KENALOG) 0.1 % APPLY TO AFFECTED AREAS TWICE DAILY FOR 2 WEEKS THEN AS NEEDED, WHEN FLARING     No current facility-administered medications on file prior to visit.    Cardiovascular and other pertinent studies:  EKG 03/26/2021: Sinus rhythm 87 bpm  Left bundle branch block   Echocardiogram 03/20/2020:  Left ventricle cavity is normal in size. Mild concentric hypertrophy of  the left ventricle. Abnormal septal wall motion due to left bundle branch  block. Normal LV systolic function with EF 56%. Doppler evidence of grade  I (impaired) diastolic dysfunction, normal LAP.  Mild (Grade I) mitral regurgitation.  Mild tricuspid regurgitation.  No evidence of pulmonary hypertension.  Recent labs: 02/28/2020: Chol 154, TG 86, HDL 64, LDL 74  10/04/2019: Glucose 94, BUN/Cr 12/0.75. EGFR 80. Na/K 141/0.4.  AST/ALT 41/47, mildly elevated  H/H 14/41. MCV 84. Platelets 249 HbA1C 5.8% Chol 216, TG 127, HDL 59, LDL 134 Apolipoprotein B 105 (<90)   Review of Systems  Cardiovascular: Negative for chest pain, dyspnea on exertion, leg swelling, palpitations and syncope.  Vitals:   03/26/21 1415  BP: 131/73  Pulse: 92  Temp: 98 F (36.7 C)  SpO2: 96%     Body mass index is 27.54 kg/m. Filed Weights   03/26/21 1415  Weight: 141 lb (64 kg)     Objective:   Physical Exam Vitals and nursing note reviewed.  Constitutional:      Appearance: She is well-developed.  Neck:     Vascular: No JVD.  Cardiovascular:     Rate and Rhythm: Normal rate and regular rhythm.     Pulses: Intact distal pulses.     Heart sounds: Normal heart sounds. No murmur heard.   Pulmonary:     Effort: Pulmonary effort is normal.     Breath sounds: Normal breath sounds. No wheezing  or rales.          Assessment & Recommendations:   75 y.o. African American female with hypertension, hyperlipidemia.  Hypertension: Controlled  Mixed hyperlipidemia Excellent improvement on Crestor 10 mg daily, continue the same. Will check follow up lipid panel  Exertional dyspnea: Mild increase. Could be related to deconditioning. If no improvement, could perform lexiscan nuclear stress test.  F/u in 6 weeks  [place], MD [facility] Pager: [phone] Office: [phone]

## 2021-04-10 ENCOUNTER — Other Ambulatory Visit: Payer: Self-pay | Admitting: Internal Medicine

## 2021-04-10 DIAGNOSIS — Z1231 Encounter for screening mammogram for malignant neoplasm of breast: Secondary | ICD-10-CM

## 2021-05-07 ENCOUNTER — Other Ambulatory Visit: Payer: Self-pay

## 2021-05-07 ENCOUNTER — Ambulatory Visit: Payer: Medicare Other | Admitting: Cardiology

## 2021-05-07 ENCOUNTER — Encounter: Payer: Self-pay | Admitting: Cardiology

## 2021-05-07 VITALS — BP 132/73 | HR 90 | Temp 98.0°F | Resp 17 | Ht 60.0 in | Wt 139.0 lb

## 2021-05-07 DIAGNOSIS — E782 Mixed hyperlipidemia: Secondary | ICD-10-CM

## 2021-05-07 DIAGNOSIS — R0609 Other forms of dyspnea: Secondary | ICD-10-CM

## 2021-05-07 DIAGNOSIS — R0600 Dyspnea, unspecified: Secondary | ICD-10-CM

## 2021-05-07 DIAGNOSIS — I10 Essential (primary) hypertension: Secondary | ICD-10-CM

## 2021-05-07 NOTE — Progress Notes (Signed)
Patient referred by Willey Blade, MD for hyperlipidemia  Subjective:   Elizabeth Benton, female    DOB: 1946-04-21, 75 y.o.   MRN: [id number]   Chief Complaint  Patient presents with  . Exertional dyspnea  . Hypertension  . Hyperlipidemia  . Follow-up    6 week    HPI  75 y.o. African American female with hypertension, hyperlipidemia.  Patient has increased walking, although remains limited due to pollen. She denies chest pain, shortness of breath, palpitations, leg edema, orthopnea, PND, TIA/syncope. She recently underwent lipid testing through PCP, results not available to me.    Current Outpatient Medications on File Prior to Visit  Medication Sig Dispense Refill  . acetaminophen (TYLENOL) 500 MG tablet Take 1,000 mg by mouth daily as needed (pain).    Marland Kitchen amLODipine (NORVASC) 5 MG tablet Take 1 tablet (5 mg total) by mouth daily. 90 tablet 3  . aspirin 81 MG chewable tablet Chew 1 tablet every day by oral route. (Patient not taking: Reported on 03/26/2021)    . Cholecalciferol (CVS VIT D 5000 HIGH-POTENCY PO) Take by mouth.    . clotrimazole-betamethasone (LOTRISONE) cream APPLY TO AFFECTED AREA AND SURROUNDING SKIN 2 TIMES A DAY IN MORNING AND EVENING FOR 2 WEEKS    . fenofibrate 160 MG tablet fenofibrate 160 mg tablet  TAKE 1 TABLET BY MOUTH EVERY DAY     No current facility-administered medications on file prior to visit.    Cardiovascular and other pertinent studies:  EKG 03/26/2021: Sinus rhythm 87 bpm  Left bundle branch block   Echocardiogram 03/20/2020:  Left ventricle cavity is normal in size. Mild concentric hypertrophy of  the left ventricle. Abnormal septal wall motion due to left bundle branch  block. Normal LV systolic function with EF 56%. Doppler evidence of grade  I (impaired) diastolic dysfunction, normal LAP.  Mild (Grade I) mitral regurgitation.  Mild tricuspid regurgitation.  No evidence of pulmonary hypertension.  Recent  labs: 02/28/2020: Chol 154, TG 86, HDL 64, LDL 74  10/04/2019: Glucose 94, BUN/Cr 12/0.75. EGFR 80. Na/K 141/0.4.  AST/ALT 41/47, mildly elevated  H/H 14/41. MCV 84. Platelets 249 HbA1C 5.8% Chol 216, TG 127, HDL 59, LDL 134 Apolipoprotein B 105 (<90)   Review of Systems  Cardiovascular: Negative for chest pain, dyspnea on exertion, leg swelling, palpitations and syncope.         Vitals:   05/07/21 1500  BP: 132/73  Pulse: 90  Resp: 17  Temp: 98 F (36.7 C)  SpO2: 95%     Body mass index is 27.15 kg/m. Filed Weights   05/07/21 1500  Weight: 139 lb (63 kg)     Objective:   Physical Exam Vitals and nursing note reviewed.  Constitutional:      Appearance: She is well-developed.  Neck:     Vascular: No JVD.  Cardiovascular:     Rate and Rhythm: Normal rate and regular rhythm.     Pulses: Intact distal pulses.     Heart sounds: Normal heart sounds. No murmur heard.   Pulmonary:     Effort: Pulmonary effort is normal.     Breath sounds: Normal breath sounds. No wheezing or rales.          Assessment & Recommendations:   75 y.o. African American female with hypertension, hyperlipidemia.  Hypertension: Controlled  Mixed hyperlipidemia: Excellent improvement on Crestor 10 mg daily, continue the same. Will get lipid panel results from PCP  Exertional dyspnea: Improved. Likely due  to deconditiioning. Monitor for now.   F/u in 6 months  Teona Vargus Esther Hardy, MD [facility] Pager: [phone] Office: [phone]

## 2021-05-30 ENCOUNTER — Other Ambulatory Visit: Payer: Self-pay

## 2021-05-30 ENCOUNTER — Ambulatory Visit
Admission: RE | Admit: 2021-05-30 | Discharge: 2021-05-30 | Disposition: A | Discharge status: Home / Self Care | Payer: Medicare Other | Source: Ambulatory Visit | Attending: Internal Medicine | Admitting: Internal Medicine

## 2021-05-30 DIAGNOSIS — Z1231 Encounter for screening mammogram for malignant neoplasm of breast: Secondary | ICD-10-CM

## 2021-06-06 ENCOUNTER — Other Ambulatory Visit: Payer: Self-pay | Admitting: Cardiology

## 2021-06-06 DIAGNOSIS — E782 Mixed hyperlipidemia: Secondary | ICD-10-CM

## 2021-06-06 DIAGNOSIS — I10 Essential (primary) hypertension: Secondary | ICD-10-CM

## 2021-11-06 ENCOUNTER — Ambulatory Visit: Payer: Medicare Other | Admitting: Cardiology

## 2021-11-14 ENCOUNTER — Ambulatory Visit: Payer: Medicare Other | Admitting: Cardiology

## 2021-11-14 ENCOUNTER — Encounter: Payer: Self-pay | Admitting: Cardiology

## 2021-11-14 ENCOUNTER — Other Ambulatory Visit: Payer: Self-pay

## 2021-11-14 VITALS — BP 144/74 | HR 91 | Temp 97.8°F | Resp 16 | Ht 60.0 in | Wt 139.0 lb

## 2021-11-14 DIAGNOSIS — I10 Essential (primary) hypertension: Secondary | ICD-10-CM

## 2021-11-14 MED ORDER — AMLODIPINE BESYLATE 5 MG PO TABS: 7.5000 mg | ORAL_TABLET | Freq: Every day | ORAL | 1 refills | Status: DC

## 2021-11-14 NOTE — Progress Notes (Signed)
    Patient referred by Willey Blade, MD for hyperlipidemia  Subjective:   Elizabeth Benton, female    DOB: January 05, 1946, 75 y.o.   MRN: [id number]   Chief Complaint  Patient presents with   Hypertension   Hyperlipidemia   Follow-up    6 month    HPI  75 y.o. African American female with hypertension, hyperlipidemia.  Patient is doing well, no cardiac complaints. She had recent lab work through PCP, results not available to me.   Current Outpatient Medications on File Prior to Visit  Medication Sig Dispense Refill   acetaminophen (TYLENOL) 500 MG tablet Take 1,000 mg by mouth daily as needed (pain).     amLODipine (NORVASC) 5 MG tablet TAKE 1 TABLET BY MOUTH EVERY DAY 90 tablet 3   Cholecalciferol ([employer] VIT D 5000 HIGH-POTENCY PO) Take by mouth.     clotrimazole-betamethasone (LOTRISONE) cream APPLY TO AFFECTED AREA AND SURROUNDING SKIN 2 TIMES A DAY IN MORNING AND EVENING FOR 2 WEEKS     rosuvastatin (CRESTOR) 20 MG tablet TAKE 1 TABLET BY MOUTH EVERY DAY 90 tablet 3   No current facility-administered medications on file prior to visit.    Cardiovascular and other pertinent studies:  EKG 11/14/2021: Sinus rhythm 90 bpm Left bundle branch block and left axis  Left atrial enlargement  Echocardiogram 03/20/2020:  Left ventricle cavity is normal in size. Mild concentric hypertrophy of  the left ventricle. Abnormal septal wall motion due to left bundle branch  block. Normal LV systolic function with EF 56%. Doppler evidence of grade  I (impaired) diastolic dysfunction, normal LAP.  Mild (Grade I) mitral regurgitation.  Mild tricuspid regurgitation.  No evidence of pulmonary hypertension.  Recent labs: 02/28/2020: Chol 154, TG 86, HDL 64, LDL 74  10/04/2019: Glucose 94, BUN/Cr 12/0.75. EGFR 80. Na/K 141/0.4.  AST/ALT 41/47, mildly elevated  H/H 14/41. MCV 84. Platelets 249 HbA1C 5.8% Chol 216, TG 127, HDL 59, LDL 134 Apolipoprotein B 105 (<90)   Review of Systems   Cardiovascular:  Negative for chest pain, dyspnea on exertion, leg swelling, palpitations and syncope.        Vitals:   11/14/21 1332  BP: (!) 144/74  Pulse: 91  Resp: 16  Temp: 97.8 F (36.6 C)  SpO2: 95%     Body mass index is 27.15 kg/m. Filed Weights   11/14/21 1332  Weight: 139 lb (63 kg)     Objective:   Physical Exam Vitals and nursing note reviewed.  Constitutional:      Appearance: She is well-developed.  Neck:     Vascular: No JVD.  Cardiovascular:     Rate and Rhythm: Normal rate and regular rhythm.     Pulses: Intact distal pulses.     Heart sounds: Normal heart sounds. No murmur heard. Pulmonary:     Effort: Pulmonary effort is normal.     Breath sounds: Normal breath sounds. No wheezing or rales.         Assessment & Recommendations:   75 y.o. African American female with hypertension, hyperlipidemia.  Hypertension: Mildly elevated today. Increased amlodipine to 7.5 mg daily. She ould like to take 5 mg in am, 2.5 mg in pm.   Mixed hyperlipidemia: Excellent improvement on Crestor 10 mg daily, continue the same. Will get lipid panel results from PCP  F/u as needed.  Nigel Mormon, MD [facility] Pager: [phone] Office: [phone]

## 2021-11-15 ENCOUNTER — Encounter: Payer: Self-pay | Admitting: Cardiology

## 2022-07-03 ENCOUNTER — Other Ambulatory Visit: Payer: Self-pay | Admitting: Internal Medicine

## 2022-07-03 ENCOUNTER — Ambulatory Visit
Admission: RE | Admit: 2022-07-03 | Discharge: 2022-07-03 | Disposition: A | Discharge status: Home / Self Care | Payer: Medicare Other | Source: Ambulatory Visit | Attending: Internal Medicine | Admitting: Internal Medicine

## 2022-07-03 DIAGNOSIS — Z1231 Encounter for screening mammogram for malignant neoplasm of breast: Secondary | ICD-10-CM

## 2022-07-07 ENCOUNTER — Other Ambulatory Visit: Payer: Self-pay | Admitting: Internal Medicine

## 2022-07-07 DIAGNOSIS — R928 Other abnormal and inconclusive findings on diagnostic imaging of breast: Secondary | ICD-10-CM

## 2022-07-16 ENCOUNTER — Ambulatory Visit
Admission: RE | Admit: 2022-07-16 | Discharge: 2022-07-16 | Disposition: A | Discharge status: Home / Self Care | Payer: Medicare Other | Source: Ambulatory Visit | Attending: Internal Medicine | Admitting: Internal Medicine

## 2022-07-16 ENCOUNTER — Other Ambulatory Visit: Payer: Self-pay | Admitting: Internal Medicine

## 2022-07-16 DIAGNOSIS — R928 Other abnormal and inconclusive findings on diagnostic imaging of breast: Secondary | ICD-10-CM

## 2022-07-16 DIAGNOSIS — N6489 Other specified disorders of breast: Secondary | ICD-10-CM

## 2022-07-16 DIAGNOSIS — N6311 Unspecified lump in the right breast, upper outer quadrant: Secondary | ICD-10-CM

## 2022-07-24 ENCOUNTER — Ambulatory Visit
Admission: RE | Admit: 2022-07-24 | Discharge: 2022-07-24 | Disposition: A | Discharge status: Home / Self Care | Payer: Medicare Other | Source: Ambulatory Visit | Attending: Internal Medicine | Admitting: Internal Medicine

## 2022-07-24 ENCOUNTER — Other Ambulatory Visit: Payer: Self-pay | Admitting: Internal Medicine

## 2022-07-24 DIAGNOSIS — N6311 Unspecified lump in the right breast, upper outer quadrant: Secondary | ICD-10-CM

## 2022-07-24 DIAGNOSIS — N6489 Other specified disorders of breast: Secondary | ICD-10-CM

## 2022-07-28 ENCOUNTER — Telehealth: Payer: Self-pay | Admitting: Hematology and Oncology

## 2022-07-28 NOTE — Telephone Encounter (Signed)
Spoke to patient to confirm afternoon clinic appointment for 8/9, paperwork will be emailed to patient

## 2022-07-31 ENCOUNTER — Encounter: Payer: Self-pay | Admitting: *Deleted

## 2022-07-31 DIAGNOSIS — Z170 Estrogen receptor positive status [ER+]: Secondary | ICD-10-CM

## 2022-08-04 NOTE — Progress Notes (Signed)
Radiation Oncology         [phone] ________________________________  Multidisciplinary Breast Oncology Clinic [facility]) Initial Outpatient Consultation  Name: Elizabeth Benton MRN: [id number]  Date: 08/05/2022  DOB: [birth year]  OV:FIEPPIR, Joelene Millin, MD  Stark Klein, MD   REFERRING PHYSICIAN: Stark Klein, MD  DIAGNOSIS: There were no encounter diagnoses.  Stage *** Right Breast UOQ, Invasive Lobular Carcinoma, ER+ / PR+ / Her2-, Grade 2  No diagnosis found.  HISTORY OF PRESENT ILLNESS::Elizabeth Benton is a 76 y.o. female who is presenting to the office today for evaluation of her newly diagnosed breast cancer. She is accompanied by ***. She is doing well overall.   She had routine screening mammography on 07/03/22 showing a possible abnormality in both the right and left breast. She underwent bilateral diagnostic mammography with tomography and bilateral breast ultrasonography at [facility] on 07/16/22 showing: a spiculated/irregular mass in the right breast at the 11 o'clock position, 13 cmfn, measuring 11 x 10 by 12 mm. No sonographic correlate was identified for the left breast asymmetry seen on screening mammogram, and no evidence of axillary adenopathy was appreciated bilaterally.   Biopsy of the 11 o'clock right breast on 07/24/22 showed: grade 2 invasive lobular carcinoma measuring 0.4 cm in the greatest linear extent. Prognostic indicators significant for: estrogen receptor, 95% positive and progesterone receptor, 80% positive, both with strong staining intensity. Proliferation marker Ki67 at 40%. HER2 negative.  Biopsy of the upper inner left breast was also performed on 07/24/22 revealing no evidence of malignancy (with fibrosis and focal chronic inflammation).   Menarche: *** years old Age at first live birth: *** years old GP: *** LMP: *** Contraceptive: *** HRT: ***   The patient was referred today for presentation in the multidisciplinary conference.   Radiology studies and pathology slides were presented there for review and discussion of treatment options.  A consensus was discussed regarding potential next steps.  PREVIOUS RADIATION THERAPY: {EXAM; YES/NO:19492::"No"}  PAST MEDICAL HISTORY:  Past Medical History:  Diagnosis Date   Depression    H/O migraine    HSV-2 (herpes simplex virus 2) infection    Hyperlipidemia    Lichen sclerosus    Monilia infection 2006   Vaginal atrophy 2009   Vaginitis and vulvovaginitis 2006    PAST SURGICAL HISTORY: Past Surgical History:  Procedure Laterality Date   ABDOMINAL HYSTERECTOMY  1987   DILATION AND CURETTAGE OF UTERUS     TONSILLECTOMY     TUBAL LIGATION     WISDOM TOOTH EXTRACTION      FAMILY HISTORY:  Family History  Problem Relation Age of Onset   Stroke Mother    Cancer Father        Throat Cancer    SOCIAL HISTORY:  Social History   Socioeconomic History   Marital status: Married    Spouse name: Not on file   Number of children: 2   Years of education: Not on file   Highest education level: Not on file  Occupational History   Not on file  Tobacco Use   Smoking status: Former    Packs/day: 0.50    Years: 40.00    Total pack years: 20.00    Types: Cigarettes    Quit date: 01/22/1997    Years since quitting: 25.5   Smokeless tobacco: Never  Vaping Use   Vaping Use: Never used  Substance and Sexual Activity   Alcohol use: Yes    Alcohol/week: 1.0 standard drink of alcohol  Types: 1 Standard drinks or equivalent per week    Comment: occ   Drug use: No   Sexual activity: Yes    Birth control/protection: Post-menopausal    Comment: hyst  Other Topics Concern   Not on file  Social History Narrative   Not on file   Social Determinants of Health   Financial Resource Strain: Not on file  Food Insecurity: Not on file  Transportation Needs: Not on file  Physical Activity: Not on file  Stress: Not on file  Social Connections: Not on file     ALLERGIES:  Allergies  Allergen Reactions   Contrast Media [Iodinated Contrast Media] Anaphylaxis   Demerol Other (See Comments)    Patient stated she will pass out, unaware of surroundings. Last time she received it (for surgery) her stomach had to be pumped.   Iodine Anaphylaxis   Shellfish Allergy Shortness Of Breath and Swelling    MEDICATIONS:  Current Outpatient Medications  Medication Sig Dispense Refill   acetaminophen (TYLENOL) 500 MG tablet Take 1,000 mg by mouth daily as needed (pain).     amLODipine (NORVASC) 5 MG tablet Take 1.5 tablets (7.5 mg total) by mouth daily. 120 tablet 1   Cholecalciferol ([employer]S VIT D 5000 HIGH-POTENCY PO) Take by mouth.     clotrimazole-betamethasone (LOTRISONE) cream APPLY TO AFFECTED AREA AND SURROUNDING SKIN 2 TIMES A DAY IN MORNING AND EVENING FOR 2 WEEKS     rosuvastatin (CRESTOR) 20 MG tablet TAKE 1 TABLET BY MOUTH EVERY DAY 90 tablet 3   No current facility-administered medications for this encounter.    REVIEW OF SYSTEMS: A 10+ POINT REVIEW OF SYSTEMS WAS OBTAINED including neurology, dermatology, psychiatry, cardiac, respiratory, lymph, extremities, GI, GU, musculoskeletal, constitutional, reproductive, HEENT. On the provided form, she reports ***. She denies *** and any other symptoms.    PHYSICAL EXAM:  vitals were not taken for this visit.  {may need to copy over vitals} Lungs are clear to auscultation bilaterally. Heart has regular rate and rhythm. No palpable cervical, supraclavicular, or axillary adenopathy. Abdomen soft, non-tender, normal bowel sounds. Breast: *** breast with no palpable mass, nipple discharge, or bleeding. *** breast with ***.   KPS = ***  100 - Normal; no complaints; no evidence of disease. 90   - Able to carry on normal activity; minor signs or symptoms of disease. 80   - Normal activity with effort; some signs or symptoms of disease. 14   - Cares for self; unable to carry on normal activity or to do  active work. 60   - Requires occasional assistance, but is able to care for most of his personal needs. 50   - Requires considerable assistance and frequent medical care. 58   - Disabled; requires special care and assistance. 45   - Severely disabled; hospital admission is indicated although death not imminent. 23   - Very sick; hospital admission necessary; active supportive treatment necessary. 10   - Moribund; fatal processes progressing rapidly. 0     - Dead  Karnofsky DA, [place], Craver LS and Burchenal [facility] [phone]) The use of the nitrogen mustards in the palliative treatment of carcinoma: with particular reference to bronchogenic carcinoma Cancer 1 634-56  LABORATORY DATA:  Lab Results  Component Value Date   WBC 7.5 11/14/2016   HGB 13.7 11/14/2016   HCT 42.0 11/14/2016   MCV 85.4 11/14/2016   PLT 213 11/14/2016   Lab Results  Component Value Date   NA 138 11/14/2016  K 4.4 11/14/2016   CL 105 11/14/2016   CO2 24 11/14/2016   No results found for: "ALT", "AST", "GGT", "ALKPHOS", "BILITOT"  PULMONARY FUNCTION TEST:   Review Flowsheet        No data to display          RADIOGRAPHY: MM LT BREAST BX W LOC DEV 1ST LESION IMAGE BX SPEC STEREO GUIDE  Addendum Date: 07/27/2022   ADDENDUM REPORT: 07/27/2022 21:36 ADDENDUM: Pathology revealed FIBROSIS WITH FOCAL CHRONIC INFLAMMATION of the LEFT breast, upper inner, (ribbon clip). This was found to be concordant by Dr. Lovey Newcomer. Pathology revealed GRADE II INVASIVE MAMMARY CARCINOMA of the RIGHT breast, 11 o'clock, (ribbon clip). This was found to be concordant by Dr. Lovey Newcomer. Pathology results were discussed with the patient by telephone. The patient reported doing well after the biopsies with tenderness at the sites and RIGHT arm stiffness. Post biopsy instructions and care were reviewed and questions were answered. The patient was encouraged to call [facility] for any additional concerns.  My direct phone number was provided. The patient was referred to [facility] on August 05, 2022. Recommendation for a bilateral breast MRI. Pathology results reported by Terie Purser, RN on 07/27/2022. Electronically Signed   By: Lovey Newcomer M.D.   On: 07/27/2022 21:36   Result Date: 07/27/2022 CLINICAL DATA:  Patient with indeterminate left breast asymmetry EXAM: LEFT BREAST STEREOTACTIC CORE NEEDLE BIOPSY COMPARISON:  Previous exam(s). FINDINGS: The patient and I discussed the procedure of stereotactic-guided biopsy including benefits and alternatives. We discussed the high likelihood of a successful procedure. We discussed the risks of the procedure including infection, bleeding, tissue injury, clip migration, and inadequate sampling. Informed written consent was given. The usual time out protocol was performed immediately prior to the procedure. Using sterile technique and 1% Lidocaine as local anesthetic, under stereotactic guidance, a 9 gauge vacuum assisted device was used to perform core needle biopsy of asymmetry within the upper inner left breast using a cranial approach. Lesion quadrant: Upper inner quadrant At the conclusion of the procedure, ribbon shaped tissue marker clip was deployed into the biopsy cavity. Follow-up 2-view mammogram was performed and dictated separately. IMPRESSION: Stereotactic-guided biopsy of left breast asymmetry. No apparent complications. Electronically Signed: By: Lovey Newcomer M.D. On: 07/24/2022 12:17  [place] RT BREAST BX W LOC DEV 1ST LESION IMG BX SPEC US GUIDE  Addendum Date: 07/27/2022   ADDENDUM REPORT: 07/27/2022 21:36 ADDENDUM: Pathology revealed FIBROSIS WITH FOCAL CHRONIC INFLAMMATION of the LEFT breast, upper inner, (ribbon clip). This was found to be concordant by Dr. Lovey Newcomer. Pathology revealed GRADE II INVASIVE MAMMARY CARCINOMA of the RIGHT breast, 11 o'clock, (ribbon clip). This was  found to be concordant by Dr. Lovey Newcomer. Pathology results were discussed with the patient by telephone. The patient reported doing well after the biopsies with tenderness at the sites and RIGHT arm stiffness. Post biopsy instructions and care were reviewed and questions were answered. The patient was encouraged to call [facility] for any additional concerns. My direct phone number was provided. The patient was referred to [facility] on August 05, 2022. Recommendation for a bilateral breast MRI. Pathology results reported by Terie Purser, RN on 07/27/2022. Electronically Signed   By: Lovey Newcomer M.D.   On: 07/27/2022 21:36   Result Date: 07/27/2022 CLINICAL  DATA:  Patient with indeterminate right breast mass 11 o'clock position. EXAM: ULTRASOUND GUIDED RIGHT BREAST CORE NEEDLE BIOPSY COMPARISON:  Previous exam(s). PROCEDURE: I met with the patient and we discussed the procedure of ultrasound-guided biopsy, including benefits and alternatives. We discussed the high likelihood of a successful procedure. We discussed the risks of the procedure, including infection, bleeding, tissue injury, clip migration, and inadequate sampling. Informed written consent was given. The usual time-out protocol was performed immediately prior to the procedure. Lesion quadrant: Upper outer quadrant Using sterile technique and 1% Lidocaine as local anesthetic, under direct ultrasound visualization, a 14 gauge spring-loaded device was used to perform biopsy of right breast mass 11 o'clock position using a lateral approach. At the conclusion of the procedure ribbon shaped tissue marker clip was deployed into the biopsy cavity. Follow up 2 view mammogram was performed and dictated separately. IMPRESSION: Ultrasound guided biopsy of right breast mass 11 o'clock position. No apparent complications. Electronically Signed: By: Lovey Newcomer  M.D. On: 07/24/2022 12:18  MM CLIP PLACEMENT LEFT  Result Date: 07/24/2022 CLINICAL DATA:  Status post stereo biopsy left breast asymmetry. EXAM: 3D DIAGNOSTIC LEFT MAMMOGRAM POST STEREOTACTIC BIOPSY COMPARISON:  Previous exam(s). FINDINGS: 3D Mammographic images were obtained following stereotactic guided biopsy of asymmetry upper inner left breast. The biopsy marking clip is in expected position at the site of biopsy. IMPRESSION: Appropriate positioning of the ribbon shaped biopsy marking clip at the site of biopsy in the upper inner left breast. Final Assessment: Post Procedure Mammograms for Marker Placement Electronically Signed   By: Lovey Newcomer M.D.   On: 07/24/2022 12:20  MM CLIP PLACEMENT RIGHT  Result Date: 07/24/2022 CLINICAL DATA:  Status post ultrasound-guided biopsy right breast mass. EXAM: 3D DIAGNOSTIC RIGHT MAMMOGRAM POST ULTRASOUND BIOPSY COMPARISON:  Previous exam(s). FINDINGS: 3D Mammographic images were obtained following ultrasound guided biopsy of right breast mass 11 o'clock position. The biopsy marking clip is in expected position at the site of biopsy. IMPRESSION: Appropriate positioning of the ribbon shaped biopsy marking clip at the site of biopsy in the upper-outer right breast. Final Assessment: Post Procedure Mammograms for Marker Placement Electronically Signed   By: Lovey Newcomer M.D.   On: 07/24/2022 12:20  MM DIAG BREAST TOMO BILATERAL  Result Date: 07/16/2022 CLINICAL DATA:  The patient was called back for bilateral asymmetries. EXAM: DIGITAL DIAGNOSTIC BILATERAL MAMMOGRAM WITH TOMOSYNTHESIS AND CAD; ULTRASOUND RIGHT BREAST LIMITED; ULTRASOUND LEFT BREAST LIMITED TECHNIQUE: Bilateral digital diagnostic mammography and breast tomosynthesis was performed. The images were evaluated with computer-aided detection.; Targeted ultrasound examination of the right breast was performed; Targeted ultrasound examination of the left breast was performed. COMPARISON:  Previous exam(s).  ACR Breast Density Category c: The breast tissue is heterogeneously dense, which may obscure small masses. FINDINGS: There is a spiculated mass in the superior right breast on only the MLO view. No other suspicious findings on the right. The asymmetry on the left improves on 2D spot imaging. However, there is suggested distortion in this region on spot imaging, particularly the small paddle spot. Targeted ultrasound is performed, showing a spiculated/irregular mass in the right breast at 11 o'clock, 13 cm from the nipple measuring 11 x 10 by 12 mm, correlating with the mammographically identified mass. No axillary adenopathy. No sonographic correlate is identified for the left breast asymmetry. No adenopathy on the left. IMPRESSION: 1. There is a spiculated mass in the right breast at 11 o'clock identified mammographically and sonographically. No adenopathy on the right. 2. There is  an asymmetry on the left with possible associated distortion best seen on the cc view with no sonographic correlate. No axillary adenopathy on the left. RECOMMENDATION: Recommend ultrasound-guided biopsy of the right breast mass. Recommend stereotactic biopsy of the left breast asymmetry. I have discussed the findings and recommendations with the patient. If applicable, a reminder letter will be sent to the patient regarding the next appointment. BI-RADS CATEGORY  5: Highly suggestive of malignancy. Electronically Signed   By: Dorise Bullion III M.D.   On: 07/16/2022 15:55  US BREAST LTD UNI RIGHT INC AXILLA  Result Date: 07/16/2022 CLINICAL DATA:  The patient was called back for bilateral asymmetries. EXAM: DIGITAL DIAGNOSTIC BILATERAL MAMMOGRAM WITH TOMOSYNTHESIS AND CAD; ULTRASOUND RIGHT BREAST LIMITED; ULTRASOUND LEFT BREAST LIMITED TECHNIQUE: Bilateral digital diagnostic mammography and breast tomosynthesis was performed. The images were evaluated with computer-aided detection.; Targeted ultrasound examination of the right  breast was performed; Targeted ultrasound examination of the left breast was performed. COMPARISON:  Previous exam(s). ACR Breast Density Category c: The breast tissue is heterogeneously dense, which may obscure small masses. FINDINGS: There is a spiculated mass in the superior right breast on only the MLO view. No other suspicious findings on the right. The asymmetry on the left improves on 2D spot imaging. However, there is suggested distortion in this region on spot imaging, particularly the small paddle spot. Targeted ultrasound is performed, showing a spiculated/irregular mass in the right breast at 11 o'clock, 13 cm from the nipple measuring 11 x 10 by 12 mm, correlating with the mammographically identified mass. No axillary adenopathy. No sonographic correlate is identified for the left breast asymmetry. No adenopathy on the left. IMPRESSION: 1. There is a spiculated mass in the right breast at 11 o'clock identified mammographically and sonographically. No adenopathy on the right. 2. There is an asymmetry on the left with possible associated distortion best seen on the cc view with no sonographic correlate. No axillary adenopathy on the left. RECOMMENDATION: Recommend ultrasound-guided biopsy of the right breast mass. Recommend stereotactic biopsy of the left breast asymmetry. I have discussed the findings and recommendations with the patient. If applicable, a reminder letter will be sent to the patient regarding the next appointment. BI-RADS CATEGORY  5: Highly suggestive of malignancy. Electronically Signed   By: Dorise Bullion III M.D.   On: 07/16/2022 15:55  US BREAST LTD UNI LEFT INC AXILLA  Result Date: 07/16/2022 CLINICAL DATA:  The patient was called back for bilateral asymmetries. EXAM: DIGITAL DIAGNOSTIC BILATERAL MAMMOGRAM WITH TOMOSYNTHESIS AND CAD; ULTRASOUND RIGHT BREAST LIMITED; ULTRASOUND LEFT BREAST LIMITED TECHNIQUE: Bilateral digital diagnostic mammography and breast tomosynthesis was  performed. The images were evaluated with computer-aided detection.; Targeted ultrasound examination of the right breast was performed; Targeted ultrasound examination of the left breast was performed. COMPARISON:  Previous exam(s). ACR Breast Density Category c: The breast tissue is heterogeneously dense, which may obscure small masses. FINDINGS: There is a spiculated mass in the superior right breast on only the MLO view. No other suspicious findings on the right. The asymmetry on the left improves on 2D spot imaging. However, there is suggested distortion in this region on spot imaging, particularly the small paddle spot. Targeted ultrasound is performed, showing a spiculated/irregular mass in the right breast at 11 o'clock, 13 cm from the nipple measuring 11 x 10 by 12 mm, correlating with the mammographically identified mass. No axillary adenopathy. No sonographic correlate is identified for the left breast asymmetry. No adenopathy on the left. IMPRESSION: 1.  There is a spiculated mass in the right breast at 11 o'clock identified mammographically and sonographically. No adenopathy on the right. 2. There is an asymmetry on the left with possible associated distortion best seen on the cc view with no sonographic correlate. No axillary adenopathy on the left. RECOMMENDATION: Recommend ultrasound-guided biopsy of the right breast mass. Recommend stereotactic biopsy of the left breast asymmetry. I have discussed the findings and recommendations with the patient. If applicable, a reminder letter will be sent to the patient regarding the next appointment. BI-RADS CATEGORY  5: Highly suggestive of malignancy. Electronically Signed   By: Dorise Bullion III M.D.   On: 07/16/2022 15:55     IMPRESSION: ***   Patient will be a good candidate for breast conservation with radiotherapy to the right breast. We discussed the general course of radiation, potential side effects, and toxicities with radiation and the patient  is interested in this approach. ***   PLAN:  ***   ------------------------------------------------  Blair Promise, PhD, MD  This document serves as a record of services personally performed by Gery Pray, MD. It was created on his behalf by Roney Mans, a trained medical scribe. The creation of this record is based on the scribe's personal observations and the provider's statements to them. This document has been checked and approved by the attending provider.

## 2022-08-05 ENCOUNTER — Ambulatory Visit: Payer: Medicare Other | Admitting: Physical Therapy

## 2022-08-05 ENCOUNTER — Encounter: Payer: Self-pay | Admitting: Hematology and Oncology

## 2022-08-05 ENCOUNTER — Inpatient Hospital Stay: Payer: 59

## 2022-08-05 ENCOUNTER — Inpatient Hospital Stay (HOSPITAL_BASED_OUTPATIENT_CLINIC_OR_DEPARTMENT_OTHER): Payer: 59 | Admitting: Genetic Counselor

## 2022-08-05 ENCOUNTER — Other Ambulatory Visit: Payer: Self-pay | Admitting: General Surgery

## 2022-08-05 ENCOUNTER — Encounter: Payer: Self-pay | Admitting: Emergency Medicine

## 2022-08-05 ENCOUNTER — Inpatient Hospital Stay: Payer: 59 | Attending: Hematology and Oncology | Admitting: Hematology and Oncology

## 2022-08-05 ENCOUNTER — Other Ambulatory Visit: Payer: Self-pay

## 2022-08-05 ENCOUNTER — Ambulatory Visit
Admission: RE | Admit: 2022-08-05 | Discharge: 2022-08-05 | Disposition: A | Discharge status: Home / Self Care | Payer: Medicare Other | Source: Ambulatory Visit | Attending: Radiation Oncology | Admitting: Radiation Oncology

## 2022-08-05 ENCOUNTER — Encounter: Payer: Self-pay | Admitting: General Practice

## 2022-08-05 VITALS — BP 152/88 | HR 86 | Temp 97.7°F | Resp 18 | Ht 60.0 in | Wt 138.9 lb

## 2022-08-05 DIAGNOSIS — C50411 Malignant neoplasm of upper-outer quadrant of right female breast: Secondary | ICD-10-CM | POA: Diagnosis not present

## 2022-08-05 DIAGNOSIS — Z170 Estrogen receptor positive status [ER+]: Secondary | ICD-10-CM

## 2022-08-05 DIAGNOSIS — Z87891 Personal history of nicotine dependence: Secondary | ICD-10-CM | POA: Diagnosis not present

## 2022-08-05 DIAGNOSIS — Z79899 Other long term (current) drug therapy: Secondary | ICD-10-CM | POA: Diagnosis not present

## 2022-08-05 DIAGNOSIS — Z8042 Family history of malignant neoplasm of prostate: Secondary | ICD-10-CM | POA: Diagnosis not present

## 2022-08-05 DIAGNOSIS — Z803 Family history of malignant neoplasm of breast: Secondary | ICD-10-CM | POA: Diagnosis not present

## 2022-08-05 LAB — CMP (CANCER CENTER ONLY)
ALT: 19 U/L (ref 0–44)
AST: 20 U/L (ref 15–41)
Albumin: 4.7 g/dL (ref 3.5–5.0)
Alkaline Phosphatase: 62 U/L (ref 38–126)
Anion gap: 6 (ref 5–15)
BUN: 11 mg/dL (ref 8–23)
CO2: 26 mmol/L (ref 22–32)
Calcium: 9.5 mg/dL (ref 8.9–10.3)
Chloride: 107 mmol/L (ref 98–111)
Creatinine: 0.75 mg/dL (ref 0.44–1.00)
GFR, Estimated: >60 mL/min (ref >60)
Glucose, Bld: 134 mg/dL — ABNORMAL HIGH (ref 70–99)
Potassium: 3.9 mmol/L (ref 3.5–5.1)
Sodium: 139 mmol/L (ref 135–145)
Total Bilirubin: 0.4 mg/dL (ref 0.3–1.2)
Total Protein: 7.5 g/dL (ref 6.5–8.1)

## 2022-08-05 LAB — CBC WITH DIFFERENTIAL (CANCER CENTER ONLY)
Abs Immature Granulocytes: 0.03 10*3/uL (ref 0.00–0.07)
Basophils Absolute: 0.1 10*3/uL (ref 0.0–0.1)
Basophils Relative: 1 %
Eosinophils Absolute: 0.2 10*3/uL (ref 0.0–0.5)
Eosinophils Relative: 2 %
HCT: 42.7 % (ref 36.0–46.0)
Hemoglobin: 14.2 g/dL (ref 12.0–15.0)
Immature Granulocytes: 0 %
Lymphocytes Relative: 17 %
Lymphs Abs: 1.4 10*3/uL (ref 0.7–4.0)
MCH: 28.1 pg (ref 26.0–34.0)
MCHC: 33.3 g/dL (ref 30.0–36.0)
MCV: 84.4 fL (ref 80.0–100.0)
Monocytes Absolute: 0.6 10*3/uL (ref 0.1–1.0)
Monocytes Relative: 8 %
Neutro Abs: 5.6 10*3/uL (ref 1.7–7.7)
Neutrophils Relative %: 72 %
Platelet Count: 230 10*3/uL (ref 150–400)
RBC: 5.06 MIL/uL (ref 3.87–5.11)
RDW: 14.1 % (ref 11.5–15.5)
WBC Count: 7.9 10*3/uL (ref 4.0–10.5)
nRBC: 0 % (ref 0.0–0.2)

## 2022-08-05 NOTE — Progress Notes (Signed)
[facility] NOTE  Patient Care Team: Willey Blade, MD as PCP - General (Internal Medicine) Stark Klein, MD as Consulting Physician (General Surgery) Nicholas Lose, MD as Consulting Physician (Hematology and Oncology) Gery Pray, MD as Consulting Physician (Radiation Oncology) Rockwell Germany, RN as Oncology Nurse Navigator Mauro Kaufmann, RN as Oncology Nurse Navigator  CHIEF COMPLAINTS/PURPOSE OF CONSULTATION:  Newly diagnosed breast cancer  HISTORY OF PRESENTING ILLNESS:  Elizabeth Benton 76 y.o. female is here because of recent diagnosis of right breast cancer.  Patient had a routine screening mammogram that detected a left breast asymmetry by ultrasound measured 1.2 cm.  Biopsy of it came back as grade 2 invasive lobular carcinoma that was ER 95% PR 80% Ki-67 40% HER2 negative.  Left asymmetry biopsy: Fibrosis and benign, she was presented this morning at the multidisciplinary tumor board and she is here today accompanied by her husband to discuss her treatment plan.  I reviewed her records extensively and collaborated the history with the patient.  SUMMARY OF ONCOLOGIC HISTORY: Oncology History  Malignant neoplasm of upper-outer quadrant of right breast in female, estrogen receptor positive ([place])  07/24/2022 Initial Diagnosis   Screening mammogram detected right breast asymmetry by ultrasound measured 1.2 cm at 7 o'clock position.  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  Left breast biopsy came back benign.   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   Vaginal atrophy 12/29/2007   Vaginitis and vulvovaginitis 12/28/2004    SURGICAL HISTORY: Past Surgical History:  Procedure Laterality Date   ABDOMINAL HYSTERECTOMY  1987   DILATION AND CURETTAGE OF UTERUS     TONSILLECTOMY     TUBAL LIGATION     WISDOM TOOTH EXTRACTION      SOCIAL HISTORY: Social History   Socioeconomic History   Marital status: Married    Spouse name: Not on file   Number of children: 2   Years of education: Not on file   Highest education level: Not on file  Occupational History   Not on file  Tobacco Use   Smoking status: Former    Packs/day: 0.50    Years: 40.00    Total pack years: 20.00    Types: Cigarettes    Quit date: 01/22/1997    Years since quitting: 25.5   Smokeless tobacco: Never  Vaping Use   Vaping Use: Never used  Substance and Sexual Activity   Alcohol use: Yes    Alcohol/week: 1.0 standard drink of alcohol    Types: 1 Standard drinks or equivalent per week    Comment: occ   Drug use: No   Sexual activity: Yes    Birth control/protection: Post-menopausal    Comment: hyst  Other Topics Concern   Not on file  Social History Narrative   Not on file   Social Determinants of Health   Financial Resource Strain:  Not on file  Food Insecurity: Not on file  Transportation Needs: Not on file  Physical Activity: Not on file  Stress: Not on file  Social Connections: Not on file  Intimate Partner Violence: Not on file    FAMILY HISTORY: Family History  Problem Relation Age of Onset   Stroke Mother    Cancer Father        Throat Cancer   Breast cancer Maternal Aunt    Breast cancer Cousin     ALLERGIES:  is allergic to contrast media [iodinated contrast media], demerol, iodine, and shellfish allergy.  MEDICATIONS:  Current Outpatient Medications  Medication Sig Dispense Refill   acetaminophen  (TYLENOL) 500 MG tablet Take 1,000 mg by mouth daily as needed (pain).     amLODipine (NORVASC) 5 MG tablet Take 1.5 tablets (7.5 mg total) by mouth daily. 120 tablet 1   Cholecalciferol ([employer] VIT D 5000 HIGH-POTENCY PO) Take by mouth.     rosuvastatin (CRESTOR) 20 MG tablet TAKE 1 TABLET BY MOUTH EVERY DAY 90 tablet 3   clotrimazole-betamethasone (LOTRISONE) cream APPLY TO AFFECTED AREA AND SURROUNDING SKIN 2 TIMES A DAY IN MORNING AND EVENING FOR 2 WEEKS (Patient not taking: Reported on 08/05/2022)     No current facility-administered medications for this visit.    REVIEW OF SYSTEMS:   Constitutional: Denies fevers, chills or abnormal night sweats Breast:  Denies any palpable lumps or discharge All other systems were reviewed with the patient and are negative.  PHYSICAL EXAMINATION: ECOG PERFORMANCE STATUS: 0 - Asymptomatic  Vitals:   08/05/22 1259  BP: (!) 152/88  Pulse: 86  Resp: 18  Temp: 97.7 F (36.5 C)  SpO2: 97%   Filed Weights   08/05/22 1259  Weight: 138 lb 14.4 oz (63 kg)    GENERAL:alert, no distress and comfortable BREAST: No palpable nodules in breast. No palpable axillary or supraclavicular lymphadenopathy (exam performed in the presence of a chaperone)   LABORATORY DATA:  I have reviewed the data as listed Lab Results  Component Value Date   WBC 7.9 08/05/2022   HGB 14.2 08/05/2022   HCT 42.7 08/05/2022   MCV 84.4 08/05/2022   PLT 230 08/05/2022   Lab Results  Component Value Date   NA 139 08/05/2022   K 3.9 08/05/2022   CL 107 08/05/2022   CO2 26 08/05/2022    RADIOGRAPHIC STUDIES: I have personally reviewed the radiological reports and agreed with the findings in the report.  ASSESSMENT AND PLAN:  Malignant neoplasm of upper-outer quadrant of right breast in female, estrogen receptor positive ([place]) 07/24/2022: Screening mammogram detected right breast asymmetry by ultrasound measured 1.2 cm at 7 o'clock position.  Biopsy revealed grade 2  invasive lobular cancer ER 95 PR 80% Ki-67 40% HER2 negative.  Left breast biopsy came back benign.  Pathology and radiology counseling:Discussed with the patient, the details of pathology including the type of breast cancer,the clinical staging, the significance of ER, PR and HER-2/neu receptors and the implications for treatment. After reviewing the pathology in detail, we proceeded to discuss the different treatment options between surgery, radiation, chemotherapy, antiestrogen therapies.  Recommendations: 1. Breast conserving surgery followed by 2. Adjuvant radiation therapy followed by 3. Adjuvant antiestrogen therapy   Return to clinic after surgery to discuss final pathology report  All questions were answered. The patient knows to call the clinic with any problems, questions or concerns.    Harriette Ohara, MD 08/05/22

## 2022-08-05 NOTE — Research (Signed)
[employer] 2021-05 - Specimen Collection Study to Evaluate Biomarkers in Subjects with Cancer   Patient Ethylene L Tobey was identified by this Clinical Research Nurse as a potential candidate for the above listed study.  This Clinical Research Nurse met with Zohra L Roots, [id number], on 08/05/22 in a manner and location that ensures patient privacy to discuss participation in the above listed research study.  Patient is Accompanied by daughter Rosilyn.  A copy of the informed consent document with embedded HIPAA language was provided to the patient.  Patient reads, speaks, and understands English.   Patient was provided with the business card of this Nurse and encouraged to contact the research team with any questions.  Approximately 15 minutes were spent with the patient reviewing the informed consent documents.  Patient was provided the option of taking informed consent documents home to review and was encouraged to review at their convenience with their support network, including other care providers. Patient took the consent documents home to review.  Will f/u with patient in the next few days to determine interest.  Rebecca 'Liza' Segal, RN, BSN Clinical Research Nurse I 08/05/22 3:44 PM   

## 2022-08-05 NOTE — Progress Notes (Signed)
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  The patient scored a 3 on the Psychosocial Distress Thermometer which indicates mild distress. Also assessed for distress and other psychosocial needs.    08/05/2022  ONCBCN DISTRESS SCREENING   Screening Type Initial Screening   Distress experienced in past week (1-10) 3   Practical problem type Work/school   Emotional problem type Nervousness/Anxiety   Referral to support programs Yes    With her daughter's encouragement, Ms Ord is beginning to view her diagnosis in part as an opportunity to reassess her priorities related to how she is spending her time. She reports low distress overall and looks forward to the additional information that the MRI and genetic testing/counseling will bring so that a treatment plan will become clear, and, in turn, she can take action.  Follow up needed: Yes.  Ms Buboltz welcomes a follow-up call in ca two weeks, when she hopes she will know more details.   [place], [place], [facility] Pager [phone] Voicemail [phone]

## 2022-08-05 NOTE — Assessment & Plan Note (Signed)
07/24/2022: Screening mammogram detected right breast asymmetry by ultrasound measured 1.2 cm at 7 o'clock position.  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  Left breast biopsy came back benign.  Pathology and radiology counseling:Discussed with the patient, the details of pathology including the type of breast cancer,the clinical staging, the significance of ER, PR and HER-2/neu receptors and the implications for treatment. After reviewing the pathology in detail, we proceeded to discuss the different treatment options between surgery, radiation, chemotherapy, antiestrogen therapies.  Recommendations: 1. Breast conserving surgery followed by 2. Adjuvant radiation therapy followed by 3. Adjuvant antiestrogen therapy   Return to clinic after surgery to discuss final pathology report

## 2022-08-06 ENCOUNTER — Encounter: Payer: Self-pay | Admitting: Genetic Counselor

## 2022-08-06 DIAGNOSIS — Z8042 Family history of malignant neoplasm of prostate: Secondary | ICD-10-CM | POA: Insufficient documentation

## 2022-08-06 DIAGNOSIS — Z803 Family history of malignant neoplasm of breast: Secondary | ICD-10-CM | POA: Insufficient documentation

## 2022-08-06 LAB — GENETIC SCREENING ORDER

## 2022-08-06 NOTE — Progress Notes (Signed)
REFERRING PROVIDER: Nicholas Lose, MD  PRIMARY PROVIDER:  Willey Blade, MD  PRIMARY REASON FOR VISIT:  1. Malignant neoplasm of upper-outer quadrant of right breast in female, estrogen receptor positive ([place])   2. Family history of breast cancer   3. Family history of prostate cancer    HISTORY OF PRESENT ILLNESS:   Elizabeth Benton, a 76 y.o. female, was seen for a Elizabeth Benton cancer genetics consultation during the breast multidisciplinary clinic at the request of Elizabeth Benton due to a personal and family history of cancer.  Elizabeth Benton presents to clinic today to discuss the possibility of a hereditary predisposition to cancer, to discuss genetic testing, and to further clarify her future cancer risks, as well as potential cancer risks for family members.   In July 2023, at the age of 3, Elizabeth Benton was diagnosed with invasive lobular carcinoma of the right breast.  CANCER HISTORY:  Oncology History  Malignant neoplasm of upper-outer quadrant of right breast in female, estrogen receptor positive ([place])  07/24/2022 Initial Diagnosis   Screening mammogram detected right breast asymmetry by ultrasound measured 1.2 cm at 7 o'clock position.  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  Left breast biopsy came back benign.   08/05/2022 Cancer Staging   Staging form: Breast, AJCC 8th Edition - Clinical stage from 08/05/2022: Stage IA (cT1c, cN0, cM0, G2, ER+, PR+, HER2-) - Signed by Elizabeth Lose, MD on 08/05/2022 Stage prefix: Initial diagnosis Histologic grading system: 3 grade system      RISK FACTORS:  Menarche was at age 36.  First live birth at age 63.  OCP use for approximately 0 years.  Ovaries intact: no.  Menopausal status: postmenopausal.  HRT use: 0 years. Colonoscopy: yes; 2023 Mammogram within the last year: yes. 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   Vaginal atrophy 12/29/2007   Vaginitis and vulvovaginitis 12/28/2004    Past Surgical History:  Procedure Laterality Date   ABDOMINAL HYSTERECTOMY  1987   DILATION AND CURETTAGE OF UTERUS     TONSILLECTOMY     TUBAL LIGATION     WISDOM TOOTH EXTRACTION      Social History   Socioeconomic History   Marital status: Married    Spouse name: Not on file   Number of children: 2   Years of education: Not on file   Highest education level: Not on file  Occupational History   Not on file  Tobacco Use   Smoking status: Former    Packs/day: 0.50    Years: 40.00    Total pack years: 20.00    Types: Cigarettes    Quit date: 01/22/1997    Years since quitting: 25.5   Smokeless tobacco: Never  Vaping Use   Vaping Use: Never used  Substance and Sexual Activity   Alcohol use: Yes    Alcohol/week: 1.0 standard drink of alcohol    Types: 1 Standard drinks or equivalent per week    Comment: occ   Drug use: No   Sexual activity: Yes    Birth control/protection: Post-menopausal    Comment: hyst  Other Topics Concern   Not  on file  Social History Narrative   Not on file   Social Determinants of Health   Financial Resource Strain: Not on file  Food Insecurity: Not on file  Transportation Needs: Not on file  Physical Activity: Not on file  Stress: Not on file  Social Connections: Not on file     FAMILY HISTORY:  We obtained a detailed, 4-generation family history.  Significant diagnoses are listed below: Family History  Problem Relation Age of Onset   Stroke Mother    Throat cancer Father 75   Prostate cancer Benton 19   Breast cancer Maternal Aunt 65   Prostate cancer Maternal Uncle    Prostate cancer Maternal Uncle    Prostate cancer Maternal Uncle    Pancreatic cancer Paternal Uncle    Breast cancer Cousin         4 maternal first cousins   Prostate cancer Cousin    Breast cancer Other        maternal first cousin once removed   Other Other        BRCA+      Elizabeth Benton was diagnosed with prostate cancer at age 64. Her maternal aunt was diagnosed with breast cancer at age 56. This aunt's daughter (Elizabeth Benton's cousin) was diagnosed with breast cancer. Elizabeth Benton has four maternal uncles. One was diagnosed with prostate cancer. His daughter (Elizabeth Benton's cousin) was diagnosed with breast cancer in her 54s and her daughter (Elizabeth Benton's first cousin once removed) was diagnosed with breast cancer and a BRCA mutation. A second maternal uncle was diagnosed with prostate cancer. His daughter (Elizabeth Benton's cousin) was diagnosed with breast cancer in her 64s and his son (Elizabeth Benton's cousin) was diagnosed with prostate cancer. A third maternal uncle was diagnosed with prostate cancer. Her fourth maternal uncle does not have a history of cancer and his daughter (Elizabeth Benton's cousin) was diagnosed with breast cancer in her 46s.   Elizabeth Benton father was diagnosed with throat cancer at age 28, he died at age 78. A paternal uncle was diagnosed with pancreatic cancer. A paternal cousin was diagnosed with ovarian cancer. There is no reported Ashkenazi Jewish ancestry.   GENETIC COUNSELING ASSESSMENT: Elizabeth Benton is a 76 y.o. female with a personal and family history of cancer which is somewhat suggestive of a hereditary cancer syndrome and predisposition to cancer. We, therefore, discussed and recommended the following at today's visit.   DISCUSSION: We discussed that 5 - 10% of cancer is hereditary, with most cases of hereditary breast cancer associated with mutations in BRCA1/2.  There are other genes that can be associated with hereditary breast, ovarian, pancreatic, and prostate cancer syndromes. Type of cancer risk and level of risk are gene-specific. We discussed that testing is beneficial for several reasons  including knowing how to follow individuals after completing their treatment, identifying whether potential treatment options would be beneficial, and understanding if other family members could be at risk for cancer and allowing them to undergo genetic testing.   We reviewed the characteristics, features and inheritance patterns of hereditary cancer syndromes. We also discussed genetic testing, including the appropriate family members to test, the process of testing, insurance coverage and turn-around-time for results. We discussed the implications of a negative, positive and/or variant of uncertain significant result. In order to get genetic test results in a timely manner so that Elizabeth Benton can use these genetic test results for surgical decisions, we recommended Elizabeth Benton pursue genetic testing for  the BRCAplus. Once complete, we recommend Elizabeth Benton pursue reflex genetic testing to a more comprehensive gene panel.   Ms. Kothari  was offered a common hereditary cancer panel (47 genes) and an expanded pan-cancer panel (77 genes). Ms. Muzyka was informed of the benefits and limitations of each panel, including that expanded pan-cancer panels contain genes that do not have clear management guidelines at this point in time.  We also discussed that as the number of genes included on a panel increases, the chances of variants of uncertain significance increases.  After considering the benefits and limitations of each gene panel, Ms. Berkovich elected to have [facility] CancerNext-Expanded Panel.  The CancerNext-Expanded gene panel offered by [facility] and includes sequencing, rearrangement, and RNA analysis for the following 77 genes: AIP, ALK, APC, ATM, AXIN2, BAP1, BARD1, BLM, BMPR1A, BRCA1, BRCA2, BRIP1, CDC73, CDH1, CDK4, CDKN1B, CDKN2A, CHEK2, CTNNA1, DICER1, FANCC, FH, FLCN, GALNT12, KIF1B, LZTR1, MAX, MEN1, MET, MLH1, MSH2, MSH3, MSH6, MUTYH, NBN, NF1, NF2, NTHL1, PALB2, PHOX2B, PMS2, POT1, PRKAR1A, PTCH1, PTEN,  RAD51C, RAD51D, RB1, RECQL, RET, SDHA, SDHAF2, SDHB, SDHC, SDHD, SMAD4, SMARCA4, SMARCB1, SMARCE1, STK11, SUFU, TMEM127, TP53, TSC1, TSC2, VHL and XRCC2 (sequencing and deletion/duplication); EGFR, EGLN1, HOXB13, KIT, MITF, PDGFRA, POLD1, and POLE (sequencing only); EPCAM and GREM1 (deletion/duplication only).   Based on Ms. Shadoan's personal and family history of cancer, she meets medical criteria for genetic testing. Despite that she meets criteria, she may still have an out of pocket cost. We discussed that if her out of pocket cost for testing is over $100, the laboratory should contact them to discuss self-pay prices, patient pay assistance programs, if applicable, and other billing options.   PLAN: After considering the risks, benefits, and limitations, Elizabeth Benton provided informed consent to pursue genetic testing and the blood sample was sent to [facility] for analysis of the CancerNext-Expanded Panel. Results should be available within approximately 1-2 weeks' time, at which point they will be disclosed by telephone to Elizabeth Benton, as will any additional recommendations warranted by these results. Ms. Lottman will receive a summary of her genetic counseling visit and a copy of her results once available. This information will also be available in Epic.   Ms. Floren questions were answered to her satisfaction today. Our contact information was provided should additional questions or concerns arise. Thank you for the referral and allowing [place] to share in the care of your patient.   Lucille Passy, MS, [facility] Genetic Counselor [email or web address] (P) [phone]  The patient was seen for a total of 20 minutes in face-to-face genetic counseling.  The patient brought her daughter. Drs. Lindi Benton and/or [facility] were available to discuss this case as needed.  _______________________________________________________________________ For Office Staff:  Number of people involved in session: 2 Was  an Intern/ student involved with case: no

## 2022-08-07 ENCOUNTER — Encounter: Payer: Self-pay | Admitting: *Deleted

## 2022-08-07 ENCOUNTER — Telehealth: Payer: Self-pay | Admitting: *Deleted

## 2022-08-07 NOTE — Telephone Encounter (Signed)
[employer] 2021-05 - Specimen Collection Study to Evaluate Biomarkers in Subjects with Cancer    Research nurse called this pt to see if she had read over the 11 page study consent form that was given to her on 08/05/22 by Donell Sievert, RN.  The pt located the consented form, but she said that she needed some additional time to read the consent form.   She said that she told Dr. Lindi Adie that she would like to enroll in the study.  The pt was told that her participation is voluntary.  The pt was told that she must sign the consent and have her blood drawn prior to any surgery or treatment for her cancer.  The pt verbalized understanding.  The nurse will follow up with the pt on 08/14/22 to see if she has any questions about the study.  The pt was thanked for continued interest in the study.  Brion Aliment RN, BSN, CCRP Clinical Research Nurse Lead 08/07/2022 3:22 PM

## 2022-08-11 ENCOUNTER — Encounter: Payer: Self-pay | Admitting: *Deleted

## 2022-08-11 ENCOUNTER — Telehealth: Payer: Self-pay | Admitting: *Deleted

## 2022-08-11 NOTE — Telephone Encounter (Signed)
Left vm regarding [facility] from 08/05/22. Contact information provided for questions or needs.

## 2022-08-14 ENCOUNTER — Encounter: Payer: Self-pay | Admitting: *Deleted

## 2022-08-14 DIAGNOSIS — Z170 Estrogen receptor positive status [ER+]: Secondary | ICD-10-CM

## 2022-08-14 NOTE — Research (Signed)
[employer] 2021-05 - Specimen Collection Study to Evaluate Biomarkers in Subjects with Cancer    Patient Elizabeth Benton was identified by Dr. Lindi Adie as a potential candidate for the above listed study.  This Clinical Research Nurse called [name], [id number], on 08/14/22 in a manner and location that ensures patient privacy to discuss participation in the above listed research study.  The pt confirmed that she had read over the consent form, and she wanted to participate in the research specimen study.  The pt agreed to meet with the research nurse on Friday, 08/21/22 to review and sign the consent form and have her samples drawn for study purposes.  The pt had no questions or concerns about the study.  The pt is aware that once she has signed the consent form and given an acceptable blood specimen then she will receive a $50 gift card.   The pt was thanked for her interest in this study. Brion Aliment RN, BSN, CCRP Clinical Research Nurse Lead 08/14/2022 4:44 PM

## 2022-08-18 ENCOUNTER — Ambulatory Visit
Admission: RE | Admit: 2022-08-18 | Discharge: 2022-08-18 | Disposition: A | Discharge status: Home / Self Care | Payer: Medicare Other | Source: Ambulatory Visit | Attending: Hematology and Oncology | Admitting: Hematology and Oncology

## 2022-08-18 DIAGNOSIS — C50411 Malignant neoplasm of upper-outer quadrant of right female breast: Secondary | ICD-10-CM

## 2022-08-18 MED ORDER — GADOBUTROL 1 MMOL/ML IV SOLN
7.0000 mL | Freq: Once | INTRAVENOUS | Status: AC | PRN
  Administered 2022-08-18: 7 mL via INTRAVENOUS

## 2022-08-19 ENCOUNTER — Other Ambulatory Visit: Payer: Self-pay | Admitting: General Surgery

## 2022-08-19 ENCOUNTER — Telehealth: Payer: Self-pay | Admitting: Genetic Counselor

## 2022-08-19 ENCOUNTER — Encounter: Payer: Self-pay | Admitting: General Practice

## 2022-08-19 ENCOUNTER — Encounter: Payer: Self-pay | Admitting: *Deleted

## 2022-08-19 DIAGNOSIS — Z170 Estrogen receptor positive status [ER+]: Secondary | ICD-10-CM

## 2022-08-19 NOTE — Telephone Encounter (Signed)
Revealed that the BRCAPlus testing is negative.  Explained that there is additional testing that is still pending.  We will call once that is completed.

## 2022-08-19 NOTE — Progress Notes (Signed)
[facility] Spiritual Care Note  Attempted [facility] follow-up by phone, leaving voicemail with direct number and encouragement to return call.   [place], [place], [facility] Pager [phone] Voicemail [phone]

## 2022-08-21 ENCOUNTER — Encounter: Payer: Self-pay | Admitting: *Deleted

## 2022-08-21 ENCOUNTER — Inpatient Hospital Stay: Payer: 59 | Admitting: *Deleted

## 2022-08-21 ENCOUNTER — Other Ambulatory Visit: Payer: Self-pay

## 2022-08-21 ENCOUNTER — Inpatient Hospital Stay: Payer: 59

## 2022-08-21 DIAGNOSIS — Z170 Estrogen receptor positive status [ER+]: Secondary | ICD-10-CM

## 2022-08-21 LAB — RESEARCH LABS

## 2022-08-21 NOTE — Research (Signed)
[employer] 2021-05 - Specimen Collection Study to Evaluate Biomarkers in Subjects with Cancer    Patient Elizabeth Benton was identified by Dr. Lindi Adie as a potential candidate for the above listed study.  This Clinical Research Nurse met with [name], [id number] on 08/21/22 in a manner and location that ensures patient privacy to discuss participation in the above listed research study.  Patient is Unaccompanied.  Patient was previously provided with informed consent documents.  Patient confirmed they have read the informed consent documents.  As outlined in the informed consent form, this Nurse and [name] discussed the purpose of the research study, the investigational nature of the study, study procedures and requirements for study participation, potential risks and benefits of study participation, as well as alternatives to participation.  This study is not blinded or double-blinded. The patient understands participation is voluntary and they may withdraw from study participation at any time.  This study does not involve randomization.  This study does not involve an investigational drug or device. This study does not involve a placebo. Patient understands enrollment is pending full eligibility review.   Confidentiality and how the patient's information will be used as part of study participation were discussed.  Patient was informed there is reimbursement provided for their time and effort spent on trial participation.  The patient is encouraged to discuss research study participation with their insurance provider to determine what costs they may incur as part of study participation, including research related injury.    All questions were answered to patient's satisfaction.  The informed consent with embedded HIPAA language was reviewed page by page.  The patient's mental and emotional status is appropriate to provide informed consent, and the patient verbalizes an understanding of  study participation.  Patient has agreed to participate in the above listed research study and has voluntarily signed the informed consent version [employer] approved version [id number] [phone] (revised [phone]) with embedded HIPAA language, version [id number] (revised on 30Jan2023)  on 08/21/22 at 10:30 AM.  The patient was provided with a copy of the signed informed consent form with embedded HIPAA language for their reference.  No study specific procedures were obtained prior to the signing of the informed consent document.  Approximately 30 minutes were spent with the patient reviewing the informed consent documents.  Patient was not requested to complete a Release of Information form.   Eligibility: Eligibility criteria reviewed with the pt. This Nurse has reviewed this patient's inclusion and exclusion criteria and confirmed [name] is eligible for study participation.  [name], Research officer, political party, completed the 2nd review for this pt and confirmed the pt met all criteria for enrollment.  In addition, eligibility confirmed by treating investigator, Dr. Lindi Adie, who agrees that patient should proceed with enrollment.  Patient will continue with enrollment.   Medical History Data  Medical history responses below was provided by the patient after she signed consent.   Medical History:  High Blood Pressure  Yes Coronary Artery Disease No Lupus    No Rheumatoid Arthritis  No Diabetes   No      Lynch Syndrome  No  Is the patient currently taking a magnesium supplement?   No  Does the patient have a personal history of cancer (greater than 5 years ago)?  No  Does the patient have a family history of cancer in 1st or 2nd degree relatives? Yes If yes, Relationship(s) and Cancer type(s)? First degree- father- throat, brother- prostate  2nd degree- uncle (  maternal) prostate, aunt (maternal) breast cancer, cousin (breast)  Does the patient have history of alcohol consumption? Yes   If yes,  current or former?current  Number of years? 20  Drinks per week? One   Does the patient have history of cigarette, cigar, pipe, or chewing tobacco use?  Yes  If yes, current for former? Former  If yes, type (Cigarette, cigar, pipe, and/or chewing tobacco)? cigarette   If former, year stopped? 1971 Number of years? 6 years  Packs/number/containers per day? 2 cigarettes/day  Blood Collection: Research blood obtained by venipuncture per patient's preference .  Patient tolerated well without any adverse events.    Gift Card: $50 gift card given to patient for her participation in the study by Larina Bras, nurse.    The pt was thanked for her participation in this data and specimen study.  The research nurse will request her tumor block to submit to the study and enter her data.    Brion Aliment RN, BSN, CCRP Clinical Research Nurse Lead 08/21/2022 11:35 AM

## 2022-08-21 NOTE — Research (Signed)
[employer] 2021-05 - Specimen Collection Study to Evaluate Biomarkers in Subjects with Cancer    08/21/22  SECOND REVIEW:  This Coordinator has reviewed this patient's inclusion and exclusion criteria and confirmed Elizabeth Benton is eligible for study participation.  Patient will continue with enrollment.  Eligibility confirmed by research nurse and treating investigator, who also agrees that patient should proceed with enrollment.

## 2022-08-24 ENCOUNTER — Telehealth: Payer: Self-pay | Admitting: Genetic Counselor

## 2022-08-24 ENCOUNTER — Encounter: Payer: Self-pay | Admitting: Genetic Counselor

## 2022-08-24 DIAGNOSIS — Z1379 Encounter for other screening for genetic and chromosomal anomalies: Secondary | ICD-10-CM | POA: Insufficient documentation

## 2022-08-24 NOTE — Telephone Encounter (Signed)
I contacted Ms. Blevens to discuss her genetic testing results. No pathogenic variants were identified in the 77 genes analyzed. Detailed clinic note to follow.  The test report has been scanned into EPIC and is located under the Molecular Pathology section of the Results Review tab.  A portion of the result report is included below for reference.   Lucille Passy, MS, [facility] Genetic Counselor [email or web address] (P) [phone]

## 2022-08-24 NOTE — Telephone Encounter (Signed)
I attempted to contact Ms. Killian to discuss her genetic testing results (77 genes). I left a voicemail requesting she call me back at [phone].  Donal Lynam Georgiana Spillane, MS, LCGC Genetic Counselor [email or web address] (P) [phone]   

## 2022-08-27 ENCOUNTER — Ambulatory Visit: Payer: Self-pay | Admitting: Genetic Counselor

## 2022-08-27 DIAGNOSIS — Z1379 Encounter for other screening for genetic and chromosomal anomalies: Secondary | ICD-10-CM

## 2022-08-27 NOTE — Progress Notes (Signed)
HPI:   Elizabeth Benton was previously seen in the [place] clinic due to a personal and family history of cancer and concerns regarding a hereditary predisposition to cancer. Please refer to our prior cancer genetics clinic note for more information regarding our discussion, assessment and recommendations, at the time. Elizabeth Benton recent genetic test results were disclosed to her, as were recommendations warranted by these results. These results and recommendations are discussed in more detail below.  CANCER HISTORY:  Oncology History  Malignant neoplasm of upper-outer quadrant of right breast in female, estrogen receptor positive ([place])  07/24/2022 Initial Diagnosis   Screening mammogram detected right breast asymmetry by ultrasound measured 1.2 cm at 7 o'clock position.  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  Left breast biopsy came back benign.   08/05/2022 Cancer Staging   Staging form: Breast, AJCC 8th Edition - Clinical stage from 08/05/2022: Stage IA (cT1c, cN0, cM0, G2, ER+, PR+, HER2-) - Signed by Nicholas Lose, MD on 08/05/2022 Stage prefix: Initial diagnosis Histologic grading system: 3 grade system    Genetic Testing   [facility] CancerNext-Expanded Panel was Negative. Report date is 08/20/2022.  The CancerNext-Expanded gene panel offered by [facility] and includes sequencing, rearrangement, and RNA analysis for the following 77 genes: AIP, ALK, APC, ATM, AXIN2, BAP1, BARD1, BLM, BMPR1A, BRCA1, BRCA2, BRIP1, CDC73, CDH1, CDK4, CDKN1B, CDKN2A, CHEK2, CTNNA1, DICER1, FANCC, FH, FLCN, GALNT12, KIF1B, LZTR1, MAX, MEN1, MET, MLH1, MSH2, MSH3, MSH6, MUTYH, NBN, NF1, NF2, NTHL1, PALB2, PHOX2B, PMS2, POT1, PRKAR1A, PTCH1, PTEN, RAD51C, RAD51D, RB1, RECQL, RET, SDHA, SDHAF2, SDHB, SDHC, SDHD, SMAD4, SMARCA4, SMARCB1, SMARCE1, STK11, SUFU, TMEM127, TP53, TSC1, TSC2, VHL and XRCC2 (sequencing and deletion/duplication); EGFR, EGLN1,  HOXB13, KIT, MITF, PDGFRA, POLD1, and POLE (sequencing only); EPCAM and GREM1 (deletion/duplication only).      FAMILY HISTORY:  We obtained a detailed, 4-generation family history.  Significant diagnoses are listed below:      Family History  Problem Relation Age of Onset   Stroke Mother     Throat cancer Father 21   Prostate cancer Brother 27   Breast cancer Maternal Aunt 65   Prostate cancer Maternal Uncle     Prostate cancer Maternal Uncle     Prostate cancer Maternal Uncle     Pancreatic cancer Paternal Uncle     Breast cancer Cousin          4 maternal first cousins   Prostate cancer Cousin     Breast cancer Other          maternal first cousin once removed   Other Other          BRCA+         Elizabeth Benton's brother was diagnosed with prostate cancer at age 58. Her maternal aunt was diagnosed with breast cancer at age 60. This aunt's daughter (Elizabeth Benton's cousin) was diagnosed with breast cancer. Elizabeth Benton has four maternal uncles. One was diagnosed with prostate cancer. His daughter (Elizabeth Benton's cousin) was diagnosed with breast cancer in her 26s and her daughter (Elizabeth Benton's first cousin once removed) was diagnosed with breast cancer and a BRCA mutation. A second maternal uncle was diagnosed with prostate cancer. His daughter (Elizabeth Benton's cousin) was diagnosed with breast cancer in her 49s and his son (Elizabeth Benton's cousin) was diagnosed with prostate cancer. A third maternal uncle was diagnosed with prostate cancer. Her fourth maternal uncle does not have a history  of cancer and his daughter (Elizabeth Benton's cousin) was diagnosed with breast cancer in her 51s.    Elizabeth Benton father was diagnosed with throat cancer at age 91, he died at age 19. A paternal uncle was diagnosed with pancreatic cancer. A paternal cousin was diagnosed with ovarian cancer. There is no reported Ashkenazi Jewish ancestry.   GENETIC TEST RESULTS:  The [facility] CancerNext-Expanded Panel found no pathogenic  mutations.   The CancerNext-Expanded gene panel offered by [facility] and includes sequencing, rearrangement, and RNA analysis for the following 77 genes: AIP, ALK, APC, ATM, AXIN2, BAP1, BARD1, BLM, BMPR1A, BRCA1, BRCA2, BRIP1, CDC73, CDH1, CDK4, CDKN1B, CDKN2A, CHEK2, CTNNA1, DICER1, FANCC, FH, FLCN, GALNT12, KIF1B, LZTR1, MAX, MEN1, MET, MLH1, MSH2, MSH3, MSH6, MUTYH, NBN, NF1, NF2, NTHL1, PALB2, PHOX2B, PMS2, POT1, PRKAR1A, PTCH1, PTEN, RAD51C, RAD51D, RB1, RECQL, RET, SDHA, SDHAF2, SDHB, SDHC, SDHD, SMAD4, SMARCA4, SMARCB1, SMARCE1, STK11, SUFU, TMEM127, TP53, TSC1, TSC2, VHL and XRCC2 (sequencing and deletion/duplication); EGFR, EGLN1, HOXB13, KIT, MITF, PDGFRA, POLD1, and POLE (sequencing only); EPCAM and GREM1 (deletion/duplication only).   The test report has been scanned into EPIC and is located under the Molecular Pathology section of the Results Review tab.  A portion of the result report is included below for reference. Genetic testing reported out on 08/20/2022.       Even though a pathogenic variant was not identified, possible explanations for her personal history of cancer may include: The cancers in Elizabeth Benton and/or her family may be due to other genetic or environmental factors. There may be a gene mutation in one of these genes that current testing methods cannot detect, but that chance is small. There could be another gene that has not yet been discovered, or that we have not yet tested, that is responsible for her personal history of cancer.   Therefore, it is important to remain in touch with cancer genetics in the future so that we can continue to offer Elizabeth Benton the most up to date genetic testing.   ADDITIONAL GENETIC TESTING:  We discussed with Elizabeth Benton that her genetic testing was fairly extensive.  If there are genes identified to increase cancer risk that can be analyzed in the future, we would be happy to discuss and coordinate this testing at that time.     CANCER SCREENING RECOMMENDATIONS:  Elizabeth Benton test result is considered negative (normal).  This means that we have not identified a hereditary cause for her personal and family history of cancer at this time.   An individual's cancer risk and medical management are not determined by genetic test results alone. Overall cancer risk assessment incorporates additional factors, including personal medical history, family history, and any available genetic information that may result in a personalized plan for cancer prevention and surveillance. Therefore, it is recommended she continue to follow the cancer management and screening guidelines provided by her oncology and primary healthcare provider.  RECOMMENDATIONS FOR FAMILY MEMBERS:   Since she did not inherit a mutation in a cancer predisposition gene included on this panel, her daughter could not have inherited a mutation from her in one of these genes. Individuals in this family might be at some increased risk of developing cancer, over the general population risk, due to the family history of cancer. We recommend women in this family have a yearly mammogram beginning at age 7, or 44 years younger than the earliest onset of cancer, an annual clinical breast exam, and perform monthly breast self-exams.  Other members  of the family may still carry a pathogenic variant in one of these genes that Elizabeth Benton did not inherit. Based on the family history, we recommend her nieces/nephews and individuals with a personal history of cancer have genetic counseling and testing.   FOLLOW-UP:  Cancer genetics is a rapidly advancing field and it is possible that new genetic tests will be appropriate for her and/or her family members in the future. We encouraged her to remain in contact with cancer genetics on an annual basis so we can update her personal and family histories and let her know of advances in cancer genetics that may benefit this family.   Our  contact number was provided. Elizabeth Benton questions were answered to her satisfaction, and she knows she is welcome to call us at anytime with additional questions or concerns.   Lucille Passy, MS, [facility] Genetic Counselor [email or web address] (P) [phone]

## 2022-08-28 ENCOUNTER — Encounter: Payer: Self-pay | Admitting: *Deleted

## 2022-09-03 ENCOUNTER — Telehealth: Payer: Self-pay | Admitting: *Deleted

## 2022-09-03 ENCOUNTER — Encounter: Payer: Self-pay | Admitting: Genetic Counselor

## 2022-09-03 ENCOUNTER — Encounter: Payer: Self-pay | Admitting: *Deleted

## 2022-09-03 DIAGNOSIS — Z170 Estrogen receptor positive status [ER+]: Secondary | ICD-10-CM

## 2022-09-03 NOTE — Telephone Encounter (Signed)
Received call from pt and daughter with changes in surgery time. Pt would like to explore bil mastectomy with reconstruction. Msg sent to Dr. Barry Dienes as well as a referral placed for pt to see Dr. Erin Hearing for plastic surgery consult.

## 2022-09-07 ENCOUNTER — Ambulatory Visit (INDEPENDENT_AMBULATORY_CARE_PROVIDER_SITE_OTHER): Payer: 59 | Admitting: Plastic Surgery

## 2022-09-07 ENCOUNTER — Encounter: Payer: Self-pay | Admitting: *Deleted

## 2022-09-07 DIAGNOSIS — C50911 Malignant neoplasm of unspecified site of right female breast: Secondary | ICD-10-CM | POA: Diagnosis not present

## 2022-09-08 ENCOUNTER — Other Ambulatory Visit: Payer: Self-pay | Admitting: General Surgery

## 2022-09-08 ENCOUNTER — Encounter: Payer: Self-pay | Admitting: *Deleted

## 2022-09-08 NOTE — Progress Notes (Signed)
Referring Provider Willey Blade, [address] [place],  [place] [address]   CC:  Right breast cancer  Elizabeth Benton is an 76 y.o. female.  HPI: Patient is a 76 year old who has a diagnosis of right breast cancer.  She is seeing Dr. Barry Dienes.  She is very interested in bilateral mastectomy but has not fully discussed this with Dr. Barry Dienes yet.  She notes that there is a left breast mass that was also being watched and she has a lot of anxiety about future breast cancer.  She is very interested in autologous reconstruction but is open to the idea of tissue expanders as a bridge to this.  She wants to proceed with her breast cancer surgery in [place].  Her current bra size is 3060.  She wants to be smaller possibly a C cup.  She does have a family history of breast cancer.  She is a former smoker but does not currently use any tobacco.  She does not have diabetes.  Her last mammogram was this summer.    Allergies  Allergen Reactions   Contrast Media [Iodinated Contrast Media] Anaphylaxis   Demerol Other (See Comments)    Patient stated she will pass out, unaware of surroundings. Last time she received it (for surgery) her stomach had to be pumped.   Iodine Anaphylaxis   Shellfish Allergy Shortness Of Breath and Swelling    Outpatient Encounter Medications as of 09/07/2022  Medication Sig   acetaminophen (TYLENOL) 500 MG tablet Take 1,000 mg by mouth daily as needed (pain).   amLODipine (NORVASC) 5 MG tablet Take 1.5 tablets (7.5 mg total) by mouth daily.   Cholecalciferol ([employer] VIT D 5000 HIGH-POTENCY PO) Take by mouth.   clotrimazole-betamethasone (LOTRISONE) cream APPLY TO AFFECTED AREA AND SURROUNDING SKIN 2 TIMES A DAY IN MORNING AND EVENING FOR 2 WEEKS (Patient not taking: Reported on 08/05/2022)   rosuvastatin (CRESTOR) 20 MG tablet TAKE 1 TABLET BY MOUTH EVERY DAY   No facility-administered encounter medications on file as of 09/07/2022.     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   Vaginal atrophy 12/29/2007   Vaginitis and vulvovaginitis 12/28/2004    Past Surgical History:  Procedure Laterality Date   ABDOMINAL HYSTERECTOMY  1987   DILATION AND CURETTAGE OF UTERUS     TONSILLECTOMY     TUBAL LIGATION     WISDOM TOOTH EXTRACTION      Family History  Problem Relation Age of Onset   Stroke Mother    Throat cancer Father 27   Prostate cancer Brother 39   Breast cancer Maternal Aunt 65   Prostate cancer Maternal Uncle    Prostate cancer Maternal Uncle    Prostate cancer Maternal Uncle    Pancreatic cancer Paternal Uncle    Breast cancer Cousin        4 maternal first cousins   Prostate cancer Cousin    Breast cancer Other        maternal first cousin once removed   Other Other        BRCA+    Social History   Social History Narrative   Not on file     Review of Systems General: Denies  fevers, chills, weight loss CV: Denies chest pain, shortness of breath, palpitations   Physical Exam    08/05/2022   12:59 PM 11/14/2021    1:32 PM 05/07/2021    3:00 PM  Vitals with BMI  Height '5\' 0"'  '5\' 0"'  '5\' 0"'   Weight 138 lbs 14 oz 139 lbs 139 lbs  BMI 27.13 54.27 06.23  Systolic 762 831 517  Diastolic 88 74 73  Pulse 86 91 90    General:  No acute distress,  Alert and oriented, Non-Toxic, Normal speech and affect Breast: Sternal notch to nipple on the right is 25 and on the left is 26, base width 16 bilaterally, nipple to fold 11 on the right and 12 on the left.  All measurements are in centimeters.  Assessment/Plan The patient is a candidate for tissue expander placement with acellular dermal matrix which we discussed.  I suggested that she schedule an additional in person or phone visit with me after she finalize her plan with  Dr. Barry Dienes.  At this point we can decide whether she wants to proceed with reconstruction.    Lennice Sites 09/08/2022, 5:41 AM

## 2022-09-11 ENCOUNTER — Telehealth: Payer: Self-pay | Admitting: *Deleted

## 2022-09-11 ENCOUNTER — Encounter: Payer: Self-pay | Admitting: *Deleted

## 2022-09-11 ENCOUNTER — Other Ambulatory Visit: Payer: Self-pay | Admitting: General Surgery

## 2022-09-11 NOTE — Telephone Encounter (Signed)
[employer] Primary: Decision ID #:[id number] No auth req for [id number], [id number], [id number]

## 2022-09-14 ENCOUNTER — Encounter: Payer: Self-pay | Admitting: *Deleted

## 2022-09-14 ENCOUNTER — Other Ambulatory Visit: Payer: Self-pay | Admitting: *Deleted

## 2022-09-14 DIAGNOSIS — C50411 Malignant neoplasm of upper-outer quadrant of right female breast: Secondary | ICD-10-CM

## 2022-09-17 ENCOUNTER — Ambulatory Visit (INDEPENDENT_AMBULATORY_CARE_PROVIDER_SITE_OTHER): Payer: Medicare Other | Admitting: Plastic Surgery

## 2022-09-17 DIAGNOSIS — C50911 Malignant neoplasm of unspecified site of right female breast: Secondary | ICD-10-CM

## 2022-09-18 NOTE — Progress Notes (Signed)
Referring Provider Willey Blade, [address] [place],  [place] [address]   CC: Breast reconstruction   Elizabeth Benton is an 76 y.o. female.  HPI: 76 year old with right breast cancer diagnosis.  She is being offered bilateral mastectomy with Dr. Barry Dienes and desires tissue expanders to be placed although she is not sure ultimately what type of reconstruction she would like.    Allergies  Allergen Reactions   Contrast Media [Iodinated Contrast Media] Anaphylaxis   Demerol Other (See Comments)    Patient stated she will pass out, unaware of surroundings. Last time she received it (for surgery) her stomach had to be pumped.   Iodine Anaphylaxis   Shellfish Allergy Shortness Of Breath and Swelling    Outpatient Encounter Medications as of 09/17/2022  Medication Sig   acetaminophen (TYLENOL) 500 MG tablet Take 1,000 mg by mouth daily as needed (pain).   amLODipine (NORVASC) 5 MG tablet Take 1.5 tablets (7.5 mg total) by mouth daily.   Cholecalciferol ([employer]S VIT D 5000 HIGH-POTENCY PO) Take by mouth.   clotrimazole-betamethasone (LOTRISONE) cream APPLY TO AFFECTED AREA AND SURROUNDING SKIN 2 TIMES A DAY IN MORNING AND EVENING FOR 2 WEEKS (Patient not taking: Reported on 08/05/2022)   rosuvastatin (CRESTOR) 20 MG tablet TAKE 1 TABLET BY MOUTH EVERY DAY   No facility-administered encounter medications on file as of 09/17/2022.     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   Vaginal atrophy 12/29/2007   Vaginitis and vulvovaginitis 12/28/2004    Past Surgical History:  Procedure Laterality Date   ABDOMINAL HYSTERECTOMY  1987   DILATION AND CURETTAGE OF UTERUS     TONSILLECTOMY     TUBAL LIGATION     WISDOM TOOTH EXTRACTION      Family History  Problem Relation Age of  Onset   Stroke Mother    Throat cancer Father 32   Prostate cancer Brother 1   Breast cancer Maternal Aunt 65   Prostate cancer Maternal Uncle    Prostate cancer Maternal Uncle    Prostate cancer Maternal Uncle    Pancreatic cancer Paternal Uncle    Breast cancer Cousin        4 maternal first cousins   Prostate cancer Cousin    Breast cancer Other        maternal first cousin once removed   Other Other        BRCA+    Social History   Social History Narrative   Not on file     Review of Systems General: Denies fevers, chills, weight loss CV: Denies chest pain, shortness of breath, palpitations   Physical Exam    08/05/2022   12:59 PM 11/14/2021    1:32 PM 05/07/2021    3:00 PM  Vitals with BMI  Height '5\' 0"'  '5\' 0"'  '5\' 0"'   Weight 138 lbs 14 oz 139 lbs 139 lbs  BMI 27.13 79.39 03.00  Systolic 923 300 762  Diastolic 88 74 73  Pulse 86 91 90     No physical exam for phone visit.  Assessment/Plan We will proceed with tissue expander reconstruction with acellular dermal matrix.  Risk and benefits discussed and the patient wishes to proceed.  Lennice Sites 09/18/2022, 12:48 PM

## 2022-09-21 NOTE — Therapy (Signed)
OUTPATIENT PHYSICAL THERAPY BREAST CANCER BASELINE EVALUATION   Patient Name: Elizabeth Benton MRN: [id number] DOB:12-05-1946, 76 y.o., female Today's Date: 09/22/2022   PT End of Session - 09/22/22 1102     Visit Number 1    Number of Visits 2    Date for PT Re-Evaluation 11/17/22    PT Start Time 1102    PT Stop Time 1145    PT Time Calculation (min) 43 min    Activity Tolerance Patient tolerated treatment well    Behavior During Therapy [facility] for tasks assessed/performed             Past Medical History:  Diagnosis Date   Arthritis    Breast cancer (HCC)    Depression    H/O migraine    HSV-2 (herpes simplex virus 2) infection    Hyperlipidemia    Lichen sclerosus    Migraine    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   Vaginal atrophy 12/29/2007   Vaginitis and vulvovaginitis 12/28/2004   Past Surgical History:  Procedure Laterality Date   ABDOMINAL HYSTERECTOMY  1987   DILATION AND CURETTAGE OF UTERUS     TONSILLECTOMY     TUBAL LIGATION     WISDOM TOOTH EXTRACTION     Patient Active Problem List   Diagnosis Date Noted   Genetic testing 08/24/2022   Family history of breast cancer 08/06/2022   Family history of prostate cancer 08/06/2022   Malignant neoplasm of upper-outer quadrant of right breast in female, estrogen receptor positive (HCC) 07/31/2022   Abnormal glucose level 12/05/2020   Constipation 12/05/2020   Lumbago with sciatica 12/05/2020   Osteopenia 12/05/2020   Overweight 12/05/2020   Screening for malignant neoplasm of colon 12/05/2020   Vitamin D deficiency 12/05/2020   Essential hypertension 11/30/2019   LBBB (left bundle branch block) 11/30/2019   Exertional dyspnea 11/30/2019   Lichen sclerosus 06/01/2012   Mixed hyperlipidemia    HSV-2 (herpes simplex virus 2) infection    Depression    H/O migraine    Monilia infection    Vaginal atrophy    Wears glasses 06/23/2011   Sinus problem/runny nose 06/23/2011   Mass on back 06/23/2011     PCP: Andi Devon, MD  REFERRING PROVIDER: Serena Croissant, MD  REFERRING DIAG: Right Breast Cancer  THERAPY DIAG:  Malignant neoplasm of upper-outer quadrant of right breast in female, estrogen receptor positive (HCC)  Abnormal posture  Rationale for Evaluation and Treatment Rehabilitation  ONSET DATE: 07/24/2022  SUBJECTIVE  SUBJECTIVE STATEMENT: Patient reports she is here today to be seen by her medical team for her newly diagnosed right breast cancer.   PERTINENT HISTORY:  Patient was diagnosed on 07/24/2022 with right grade 2 Invasive Lobular Carcinoma. It measures 1.2 cm and is located in the upper-outer quadrant. It is ER 95%, PR 80% and HER 2 negative with a Ki67 of 40%. She is pending a bilateral mastectomy on 10/20/2022 with left prophylactic  PATIENT GOALS   reduce lymphedema risk and learn post op HEP.   PAIN:  Are you having pain? Yes: NPRS scale: 1-2/10 Pain location: bilateral breast from biopsy Pain description: tender like a bruise Aggravating factors: lying down Relieving factors: no pressure on it   PRECAUTIONS: Active CA , DDD LB  HAND DOMINANCE: right  WEIGHT BEARING RESTRICTIONS No  FALLS:  Has patient fallen in last 6 months? 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  PRIOR LEVEL OF FUNCTION: Independent   OBJECTIVE  COGNITION:  Overall cognitive status: Within functional limits for tasks assessed    POSTURE:  Forward head and rounded shoulders posture  UPPER EXTREMITY  AROM/PROM:  A/PROM RIGHT   eval   Shoulder extension 60  Shoulder flexion 141  Shoulder abduction 168  Shoulder internal rotation 55  Shoulder external rotation 112    (Blank rows = not tested)  A/PROM LEFT   eval  Shoulder extension 58  Shoulder flexion 159  Shoulder abduction 165  Shoulder internal rotation 65  Shoulder external rotation 112    (Blank rows = not tested)   CERVICAL AROM: All within functional  limits: Decreased 20 % Rotation and 50% SB     UPPER EXTREMITY STRENGTH: WNL   LYMPHEDEMA ASSESSMENTS:   LANDMARK RIGHT   eval  10 cm proximal to olecranon process 28.5  Olecranon process 24.6  10 cm proximal to ulnar styloid process 21.8  Just proximal to ulnar styloid process 16.4  Across hand at thumb web space 18.3  At base of 2nd digit 6.5  (Blank rows = not tested)  LANDMARK LEFT   eval  10 cm proximal to olecranon process 29.3  Olecranon process 24.9  10 cm proximal to ulnar styloid process 21.0  Just proximal to ulnar styloid process 16.3  Across hand at thumb web space 18.0  At base of 2nd digit 6.45  (Blank rows = not tested)   L-DEX LYMPHEDEMA SCREENING:  The patient was assessed using the L-Dex machine today to produce a lymphedema index baseline score. The patient will be reassessed on a regular basis (typically every 3 months) to obtain new L-Dex scores. If the score is > 6.5 points away from his/her baseline score indicating onset of subclinical lymphedema, it will be recommended to wear a compression garment for 4 weeks, 12 hours per day and then be reassessed. If the score continues to be > 6.5 points from baseline at reassessment, we will initiate lymphedema treatment. Assessing in this manner has a 95% rate of preventing clinically significant lymphedema.   L-DEX FLOWSHEETS - 09/22/22 1100       L-DEX LYMPHEDEMA SCREENING   Measurement Type Unilateral    L-DEX MEASUREMENT EXTREMITY Upper Extremity    POSITION  Standing     DOMINANT SIDE Right  At Risk Side Right    BASELINE SCORE (UNILATERAL) 3.8              QUICK DASH SURVEY: 36%  PATIENT EDUCATION:  Education details: Lymphedema risk reduction and post op shoulder/posture HEP, ABC class, SOZO screens Person educated: Patient Education method: Explanation, Demonstration, Handout Education comprehension: Patient verbalized understanding and returned demonstration   HOME EXERCISE PROGRAM: Patient was instructed today in a home exercise program today for post op shoulder range of motion. These included active assist shoulder flexion in sitting, scapular retraction, wall walking with shoulder abduction, and hands behind head external rotation.  She was encouraged to do these twice a day, holding 3 seconds and repeating 5 times when permitted by her physician.   ASSESSMENT:  CLINICAL IMPRESSION: Pts.multidisciplinary medical team met prior to her assessments to determine a recommended treatment plan. She is planning to have a bilateral mastectomy with SLNB on the Right on  10/20/2022. She will benefit from a post op PT reassessment to determine needs and from L-Dex screens every 3 months for 2 years to detect subclinical lymphedema.  Pt will benefit from skilled therapeutic intervention to improve on the following deficits: Decreased knowledge of precautions, impaired UE functional use, pain, decreased ROM, postural dysfunction.   PT treatment/interventions: ADL/self-care home management, pt/family education, therapeutic exercise  REHAB POTENTIAL: Excellent  CLINICAL DECISION MAKING: Stable/uncomplicated  EVALUATION COMPLEXITY: Low   GOALS: Goals reviewed with patient? YES  LONG TERM GOALS: (STG=LTG)    Name Target Date Goal status  1 Pt will be able to verbalize understanding of pertinent lymphedema risk reduction practices relevant to her dx specifically related to skin care.  Baseline:  No knowledge 09/22/2022 Achieved at eval  2 Pt  will be able to return demo and/or verbalize understanding of the post op HEP related to regaining shoulder ROM. Baseline:  No knowledge 09/22/2022 Achieved at eval  3 Pt will be able to verbalize understanding of the importance of attending the post op After Breast CA Class for further lymphedema risk reduction education and therapeutic exercise.  Baseline:  No knowledge 09/22/2022 Achieved at eval  4 Pt will demo she has regained full shoulder ROM and function post operatively compared to baselines.  Baseline: See objective measurements taken today. 11/17/2022  INITIAL     PLAN: PT FREQUENCY/DURATION: EVAL and 1 follow up appointment.   PLAN FOR NEXT SESSION: will reassess 3-4 weeks post op to determine needs.   Patient will follow up at outpatient cancer rehab 3-4 weeks following surgery.  If the patient requires physical therapy at that time, a specific plan will be dictated and sent to the referring physician for approval. The patient was educated today on appropriate basic range of motion exercises to begin post operatively and the importance of attending the After Breast Cancer class following surgery.  Patient was educated today on lymphedema risk reduction practices as it pertains to recommendations that will benefit the patient immediately following surgery.  She verbalized good understanding.    Physical Therapy Information for After Breast Cancer Surgery/Treatment:  Lymphedema is a swelling condition that you may be at risk for in your arm if you have lymph nodes removed from the armpit area.  After a sentinel node biopsy, the risk is approximately 5-9% and is higher after an axillary node dissection.  There is treatment available for this condition and it is not life-threatening.  Contact your physician or physical therapist with concerns. You may begin the 4 shoulder/posture exercises (see  additional sheet) when permitted by your physician (typically a week after surgery).  If you  have drains, you may need to wait until those are removed before beginning range of motion exercises.  A general recommendation is to not lift your arms above shoulder height until drains are removed.  These exercises should be done to your tolerance and gently.  This is not a "no pain/no gain" type of recovery so listen to your body and stretch into the range of motion that you can tolerate, stopping if you have pain.  If you are having immediate reconstruction, ask your plastic surgeon about doing exercises as he or she may want you to wait. We encourage you to attend the free one time ABC (After Breast Cancer) class offered by [facility].  You will learn information related to lymphedema risk, prevention and treatment and additional exercises to regain mobility following surgery.  You can call [phone] for more information.  This is offered the 1st and 3rd Monday of each month.  You only attend the class one time. While undergoing any medical procedure or treatment, try to avoid blood pressure being taken or needle sticks from occurring on the arm on the side of cancer.   This recommendation begins after surgery and continues for the rest of your life.  This may help reduce your risk of getting lymphedema (swelling in your arm). An excellent resource for those seeking information on lymphedema is the National Lymphedema Network's web site. It can be accessed at www.lymphnet.org If you notice swelling in your hand, arm or breast at any time following surgery (even if it is many years from now), please contact your doctor or physical therapist to discuss this.  Lymphedema can be treated at any time but it is easier for you if it is treated early on.  If you feel like your shoulder motion is not returning to normal in a reasonable amount of time, please contact your surgeon or physical therapist.  [facility] [id number]. [address], [address],  [place] [address]  ABC CLASS After Breast Cancer Class  After Breast Cancer Class is a specially designed exercise class to assist you in a safe recover after having breast cancer surgery.  In this class you will learn how to get back to full function whether your drains were just removed or if you had surgery a month ago.  This one-time class is held the 1st and 3rd Monday of every month from 11:00 a.m. until 12:00 noon virtually.  This class is FREE and space is limited. For more information or to register for the next available class, call [phone].  Class Goals  Understand specific stretches to improve the flexibility of you chest and shoulder. Learn ways to safely strengthen your upper body and improve your posture. Understand the warning signs of infection and why you may be at risk for an arm infection. Learn about Lymphedema and prevention.  ** You do not attend this class until after surgery.  Drains must be removed to participate  Patient was instructed today in a home exercise program today for post op shoulder range of motion. These included active assist shoulder flexion in sitting, scapular retraction, wall walking with shoulder abduction, and hands behind head external rotation.  She was encouraged to do these twice a day, holding 3 seconds and repeating 5 times when permitted by her physician.    Waynette Buttery, PT 09/22/2022, 11:58 AM

## 2022-09-22 ENCOUNTER — Other Ambulatory Visit: Payer: Self-pay

## 2022-09-22 ENCOUNTER — Ambulatory Visit: Payer: 59 | Attending: Hematology and Oncology

## 2022-09-22 DIAGNOSIS — C50411 Malignant neoplasm of upper-outer quadrant of right female breast: Secondary | ICD-10-CM | POA: Insufficient documentation

## 2022-09-22 DIAGNOSIS — Z170 Estrogen receptor positive status [ER+]: Secondary | ICD-10-CM | POA: Diagnosis present

## 2022-09-22 DIAGNOSIS — R293 Abnormal posture: Secondary | ICD-10-CM | POA: Insufficient documentation

## 2022-10-01 ENCOUNTER — Encounter: Payer: Medicare Other | Admitting: Student

## 2022-10-12 ENCOUNTER — Other Ambulatory Visit (HOSPITAL_COMMUNITY): Payer: Medicare Other

## 2022-10-12 ENCOUNTER — Encounter: Payer: Medicare Other | Admitting: Student

## 2022-10-12 NOTE — Pre-Procedure Instructions (Signed)
Surgical Instructions    Your procedure is scheduled on October 20, 2022.  Report to [facility] Main Entrance "A" at 5:30 A.M., then check in with the Admitting office.  Call this number if you have problems the morning of surgery:  [phone]   If you have any questions prior to your surgery date call [phone]: Open Monday-Friday 8am-4pm    Remember:  Do not eat after midnight the night before your surgery  You may drink clear liquids until 4:30 AM the morning of your surgery.   Clear liquids allowed are: Water, Non-Citrus Juices (without pulp), Carbonated Beverages, Clear Tea, Black Coffee Only (NO MILK, CREAM OR POWDERED CREAMER of any kind), and Gatorade.      Take these medicines the morning of surgery with A SIP OF WATER:  amLODipine (NORVASC)  rosuvastatin (CRESTOR)    Take these medicines the morning of surgery with a sip of water AS NEEDED:  acetaminophen (TYLENOL)  ALPRAZolam (XANAX)   Carboxymethylcellul-Glycerin (LUBRICATING EYE DROPS OP)    As of today, STOP taking any Aspirin (unless otherwise instructed by your surgeon) Aleve, Naproxen, Ibuprofen, Motrin, Advil, Goody's, BC's, all herbal medications, fish oil, and all vitamins.                     Do NOT Smoke (Tobacco/Vaping) for 24 hours prior to your procedure.  If you use a CPAP at night, you may bring your mask/headgear for your overnight stay.   Contacts, glasses, piercing's, hearing aid's, dentures or partials may not be worn into surgery, please bring cases for these belongings.    For patients admitted to the hospital, discharge time will be determined by your treatment team.   Patients discharged the day of surgery will not be allowed to drive home, and someone needs to stay with them for 24 hours.  SURGICAL WAITING ROOM VISITATION Patients having surgery or a procedure may have no more than 2 support people in the waiting area - these visitors may rotate.   Children under the age of 71 must  have an adult with them who is not the patient. If the patient needs to stay at the hospital during part of their recovery, the visitor guidelines for inpatient rooms apply. Pre-op nurse will coordinate an appropriate time for 1 support person to accompany patient in pre-op.  This support person may not rotate.   Please refer to the [facility] website for the visitor guidelines for Inpatients (after your surgery is over and you are in a regular room).    Special instructions:   [facility]- Preparing For Surgery  Before surgery, you can play an important role. Because skin is not sterile, your skin needs to be as free of germs as possible. You can reduce the number of germs on your skin by washing with CHG (chlorahexidine gluconate) Soap before surgery.  CHG is an antiseptic cleaner which kills germs and bonds with the skin to continue killing germs even after washing.    Oral Hygiene is also important to reduce your risk of infection.  Remember - BRUSH YOUR TEETH THE MORNING OF SURGERY WITH YOUR REGULAR TOOTHPASTE  Please do not use if you have an allergy to CHG or antibacterial soaps. If your skin becomes reddened/irritated stop using the CHG.  Do not shave (including legs and underarms) for at least 48 hours prior to first CHG shower. It is OK to shave your face.  Please follow these instructions carefully.   Shower the [employer]  BEFORE SURGERY and the MORNING OF SURGERY  If you chose to wash your hair, wash your hair first as usual with your normal shampoo.  After you shampoo, rinse your hair and body thoroughly to remove the shampoo.  Use CHG Soap as you would any other liquid soap. You can apply CHG directly to the skin and wash gently with a scrungie or a clean washcloth.   Apply the CHG Soap to your body ONLY FROM THE NECK DOWN.  Do not use on open wounds or open sores. Avoid contact with your eyes, ears, mouth and genitals (private parts). Wash Face and genitals (private parts)  with  your normal soap.   Wash thoroughly, paying special attention to the area where your surgery will be performed.  Thoroughly rinse your body with warm water from the neck down.  DO NOT shower/wash with your normal soap after using and rinsing off the CHG Soap.  Pat yourself dry with a CLEAN TOWEL.  Wear CLEAN PAJAMAS to bed the night before surgery  Place CLEAN SHEETS on your bed the night before your surgery  DO NOT SLEEP WITH PETS.   Day of Surgery: Take a shower with CHG soap. Do not wear jewelry or makeup Do not wear lotions, powders, perfumes/colognes, or deodorant. Do not shave 48 hours prior to surgery.  Men may shave face and neck. Do not bring valuables to the hospital.  [facility] is not responsible for any belongings or valuables. Do not wear nail polish, gel polish, artificial nails, or any other type of covering on natural nails (fingers and toes) If you have artificial nails or gel coating that need to be removed by a nail salon, please have this removed prior to surgery. Artificial nails or gel coating may interfere with anesthesia's ability to adequately monitor your vital signs.  Wear Clean/Comfortable clothing the morning of surgery Remember to brush your teeth WITH YOUR REGULAR TOOTHPASTE.   Please read over the following fact sheets that you were given.    If you received a COVID test during your pre-op visit  it is requested that you wear a mask when out in public, stay away from anyone that may not be feeling well and notify your surgeon if you develop symptoms. If you have been in contact with anyone that has tested positive in the last 10 days please notify you surgeon.

## 2022-10-13 ENCOUNTER — Other Ambulatory Visit: Payer: Self-pay

## 2022-10-13 ENCOUNTER — Encounter (HOSPITAL_COMMUNITY): Payer: Self-pay

## 2022-10-13 ENCOUNTER — Encounter (HOSPITAL_COMMUNITY)
Admission: RE | Admit: 2022-10-13 | Discharge: 2022-10-13 | Disposition: A | Discharge status: Home / Self Care | Payer: 59 | Source: Ambulatory Visit | Attending: General Surgery | Admitting: General Surgery

## 2022-10-13 VITALS — BP 136/63 | HR 82 | Temp 98.1°F | Resp 17 | Ht 60.0 in | Wt 141.7 lb

## 2022-10-13 DIAGNOSIS — Z01812 Encounter for preprocedural laboratory examination: Secondary | ICD-10-CM | POA: Insufficient documentation

## 2022-10-13 DIAGNOSIS — E782 Mixed hyperlipidemia: Secondary | ICD-10-CM | POA: Diagnosis not present

## 2022-10-13 DIAGNOSIS — R222 Localized swelling, mass and lump, trunk: Secondary | ICD-10-CM

## 2022-10-13 HISTORY — DX: Essential (primary) hypertension: I10

## 2022-10-13 LAB — CBC
HCT: 42.2 % (ref 36.0–46.0)
Hemoglobin: 13.7 g/dL (ref 12.0–15.0)
MCH: 28.1 pg (ref 26.0–34.0)
MCHC: 32.5 g/dL (ref 30.0–36.0)
MCV: 86.7 fL (ref 80.0–100.0)
Platelets: 233 10*3/uL (ref 150–400)
RBC: 4.87 MIL/uL (ref 3.87–5.11)
RDW: 14.2 % (ref 11.5–15.5)
WBC: 6.6 10*3/uL (ref 4.0–10.5)
nRBC: 0 % (ref 0.0–0.2)

## 2022-10-13 LAB — BASIC METABOLIC PANEL
Anion gap: 8 (ref 5–15)
BUN: 11 mg/dL (ref 8–23)
CO2: 24 mmol/L (ref 22–32)
Calcium: 9.3 mg/dL (ref 8.9–10.3)
Chloride: 107 mmol/L (ref 98–111)
Creatinine, Ser: 0.76 mg/dL (ref 0.44–1.00)
GFR, Estimated: >60 mL/min (ref >60)
Glucose, Bld: 112 mg/dL — ABNORMAL HIGH (ref 70–99)
Potassium: 3.7 mmol/L (ref 3.5–5.1)
Sodium: 139 mmol/L (ref 135–145)

## 2022-10-13 NOTE — Progress Notes (Signed)
PCP - Willey Blade MD Cardiologist - Dr. Vernell Leep 11/14/21 note in EPIC  PPM/ICD - Denies Device Orders -  Rep Notified -   Chest x-ray - 11/14/16 EKG - 11/14/21 Stress Test - Denies ECHO - 03/19/20 Cardiac Cath - Denies  Sleep Study - Denies  DM - Denies  Blood Thinner Instructions: Denies Aspirin Instructions:Denies  ERAS Protcol -Yes  COVID TEST- NI   Anesthesia review: No  Patient denies shortness of breath, fever, cough and chest pain at PAT appointment   All instructions explained to the patient, with a verbal understanding of the material. Patient agrees to go over the instructions while at home for a better understanding.  The opportunity to ask questions was provided.

## 2022-10-14 ENCOUNTER — Encounter (HOSPITAL_BASED_OUTPATIENT_CLINIC_OR_DEPARTMENT_OTHER): Payer: Self-pay

## 2022-10-14 ENCOUNTER — Ambulatory Visit (HOSPITAL_BASED_OUTPATIENT_CLINIC_OR_DEPARTMENT_OTHER): Admit: 2022-10-14 | Payer: Medicare Other | Admitting: General Surgery

## 2022-10-14 SURGERY — BREAST LUMPECTOMY WITH RADIOACTIVE SEED LOCALIZATION
Anesthesia: General | Site: Breast | Laterality: Right

## 2022-10-15 ENCOUNTER — Other Ambulatory Visit: Payer: Self-pay | Admitting: General Surgery

## 2022-10-15 ENCOUNTER — Ambulatory Visit (INDEPENDENT_AMBULATORY_CARE_PROVIDER_SITE_OTHER): Payer: 59 | Admitting: Student

## 2022-10-15 VITALS — BP 147/87 | HR 80 | Ht 60.0 in | Wt 142.4 lb

## 2022-10-15 DIAGNOSIS — C50911 Malignant neoplasm of unspecified site of right female breast: Secondary | ICD-10-CM

## 2022-10-15 MED ORDER — SULFAMETHOXAZOLE-TRIMETHOPRIM 800-160 MG PO TABS: 1.0000 | ORAL_TABLET | Freq: Two times a day (BID) | ORAL | 0 refills | Status: AC

## 2022-10-15 MED ORDER — ONDANSETRON HCL 4 MG PO TABS: 4.0000 mg | ORAL_TABLET | Freq: Three times a day (TID) | ORAL | 0 refills | Status: DC | PRN

## 2022-10-15 MED ORDER — TRAMADOL HCL 50 MG PO TABS: 50.0000 mg | ORAL_TABLET | Freq: Three times a day (TID) | ORAL | 0 refills | Status: DC | PRN

## 2022-10-15 NOTE — H&P (View-Only) (Signed)
Patient ID: Elizabeth Benton, female    DOB: Dec 26, 1946, 76 y.o.   MRN: [id number]  Chief Complaint  Patient presents with   Pre-op Exam      ICD-10-CM   1. 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  female  with a history of breast cancer.  She presents for preoperative evaluation for upcoming procedure, breast reconstruction with tissue expander and Flex HD placement, scheduled for 10/20/2022 with Dr.   Erin Hearing.  Patient will also be undergoing right mastectomy with lymph node biopsy and left total mastectomy with Dr. Barry Dienes on the same day.  The patient has not had problems with anesthesia.  Patient reports she has high blood pressure.  She denies being on any blood thinners.  She states that she is not a smoker.  Patient denies any use of birth control or hormone replacement.  She denies any history of miscarriages.  She denies any personal or family history of blood clots or clotting diseases.  Patient denies any recent surgeries, traumas, infections, strokes or heart attacks.  She denies any history of inflammatory bowel disease or lung disease.  She has a positive history for cancer.  She denies any varicose veins to her legs or swollen legs.  She denies any recent fevers or chills.  She denies any recent changes in her health.  Patient reports that she is currently a 80 D cup and would like to be a C cup ultimately.  Patient reported that she is still unsure if she would ultimately want autologous reconstruction after the expanders or if she would like to eventually take the expanders out and have no further reconstruction.  She states that she will not want implants ultimately and will want the expanders as a bridge to autologous reconstruction at this time.   Summary of Previous Visit: Patient was initially seen by Dr. Erin Hearing for  consult on 09/07/2022.  At this visit, patient reported that she was very interested in bilateral mastectomy but had yet to talk about it with Dr. Barry Dienes.  Patient at this visit reported that she was very interested in autologous reconstruction but is open to the idea of tissue expanders as a bridge to the autologous reconstruction.  Patient was found to be a candidate for tissue expander placement with acellular dermal matrix.  Plan was for patient to follow-up with Dr. Barry Dienes and proceed from there.  Patient was then seen again in the clinic on 09/17/2022.  At this visit, she reported that she is being offered bilateral mastectomy with Dr. Barry Dienes and reported that she desires tissue expanders to be placed although she is not sure ultimately what type of reconstruction she would like.  Plan was for patient to proceed with tissue expander reconstruction with acellular dermal matrix.  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  PMH Significant for: Hypertension, breast cancer  Chemotherapy/radiation: Patient was told that she will most likely not have chemotherapy or radiation.  Past Medical History: Allergies: Allergies  Allergen Reactions   Contrast Media [Iodinated Contrast Media] Anaphylaxis   Demerol Other (See Comments)    Patient stated she will pass out, unaware of surroundings. Last time she received it (for surgery) her stomach had to be pumped.  Iodine Anaphylaxis    Current Medications:  Current Outpatient Medications:    acetaminophen (TYLENOL) 650 MG CR tablet, Take 650 mg by mouth every 8 (eight) hours as needed for pain., Disp: , Rfl:    ALPRAZolam (XANAX) 0.25 MG tablet, Take 0.25-0.5 mg by mouth every 8 (eight) hours as needed for anxiety., Disp: , Rfl:    amLODipine (NORVASC) 5 MG tablet, Take 1.5 tablets (7.5 mg total) by mouth daily. (Patient taking differently: Take 5 mg by mouth daily.), Disp: 120  tablet, Rfl: 1   betamethasone dipropionate 0.05 % cream, Apply 1 Application topically 2 (two) times daily as needed for rash., Disp: , Rfl:    Carboxymethylcellul-Glycerin (LUBRICATING EYE DROPS OP), Place 1 drop into both eyes daily as needed (dry eyes)., Disp: , Rfl:    ondansetron (ZOFRAN) 4 MG tablet, Take 1 tablet (4 mg total) by mouth every 8 (eight) hours as needed for up to 20 doses for nausea or vomiting., Disp: 20 tablet, Rfl: 0   rosuvastatin (CRESTOR) 20 MG tablet, TAKE 1 TABLET BY MOUTH EVERY DAY, Disp: 90 tablet, Rfl: 3   sulfamethoxazole-trimethoprim (BACTRIM DS) 800-160 MG tablet, Take 1 tablet by mouth 2 (two) times daily for 14 days., Disp: 28 tablet, Rfl: 0   traMADol (ULTRAM) 50 MG tablet, Take 1 tablet (50 mg total) by mouth every 8 (eight) hours as needed for up to 20 doses., Disp: 20 tablet, Rfl: 0  Past Medical Problems: Past Medical History:  Diagnosis Date   Arthritis    Breast cancer ([place])    Depression    H/O migraine    HSV-2 (herpes simplex virus 2) infection    Hyperlipidemia    Hypertension    Lichen sclerosus    Migraine    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   Vaginal atrophy 12/29/2007   Vaginitis and vulvovaginitis 12/28/2004    Past Surgical History: Past Surgical History:  Procedure Laterality Date   ABDOMINAL HYSTERECTOMY  1987   DILATION AND CURETTAGE OF UTERUS     TONSILLECTOMY     TUBAL LIGATION     WISDOM TOOTH EXTRACTION      Social History: Social History   Socioeconomic History   Marital status: Married    Spouse name: Mahalie Kanner   Number of children: 2   Years of education: Not on file   Highest education level: Not on file  Occupational History   Not on file  Tobacco Use   Smoking status: Former    Packs/day: 0.50    Years: 40.00    Total pack years: 20.00    Types: Cigarettes    Quit date: 01/22/1997    Years since quitting: 25.7   Smokeless tobacco: Never  Vaping Use   Vaping Use: Never used  Substance and Sexual  Activity   Alcohol use: Yes    Alcohol/week: 1.0 standard drink of alcohol    Types: 1 Standard drinks or equivalent per week    Comment: occ   Drug use: No   Sexual activity: Not Currently    Birth control/protection: Post-menopausal    Comment: hyst  Other Topics Concern   Not on file  Social History Narrative   Not on file   Social Determinants of Health   Financial Resource Strain: Not on file  Food Insecurity: Not on file  Transportation Needs: Not on file  Physical Activity: Not on file  Stress: Not on file  Social Connections: Not on file  Intimate Partner Violence: Not on file  Family History: Family History  Problem Relation Age of Onset   Stroke Mother    Throat cancer Father 27   Prostate cancer Brother 72   Breast cancer Maternal Aunt 65   Prostate cancer Maternal Uncle    Prostate cancer Maternal Uncle    Prostate cancer Maternal Uncle    Pancreatic cancer Paternal Uncle    Breast cancer Cousin        4 maternal first cousins   Prostate cancer Cousin    Breast cancer Other        maternal first cousin once removed   Other Other        BRCA+    Review of Systems: Denies recent fevers and chills. Denies any recent changes in health.   Physical Exam: Vital Signs BP (!) 147/87 (BP Location: Left Arm, Patient Position: Sitting, Cuff Size: Small)   Pulse 80   Ht 5' (1.524 m)   Wt 142 lb 6.4 oz (64.6 kg)   SpO2 97%   BMI 27.81 kg/m   Physical Exam  Constitutional:      General: Not in acute distress.    Appearance: Normal appearance. Not ill-appearing.  HENT:     Head: Normocephalic and atraumatic.  Neck:     Musculoskeletal: Normal range of motion.  Cardiovascular:     Rate and Rhythm: Normal rate Pulmonary:     Effort: Pulmonary effort is normal. No respiratory distress.  Musculoskeletal: Normal range of motion.  Skin:    General: Skin is warm and dry.     Findings: No erythema or rash.  Neurological:     Mental Status: Alert and  oriented to person, place, and time. Mental status is at baseline.  Psychiatric:        Mood and Affect: Mood normal.        Behavior: Behavior normal.    Assessment/Plan: The patient is scheduled for bilateral breast reconstruction with tissue expander and Flex HD placement with Dr.  Erin Hearing .  Risks, benefits, and alternatives of procedure discussed, questions answered and consent obtained.    Smoking Status: Non-smoker; Counseling Given?  N/A I  Caprini Score: 8; Risk Factors include: Age, BMI greater than 25, history of malignancy , and length of planned surgery. Recommendation for mechanical and possible pharmacological prophylaxis. Encourage early ambulation.  I will discuss with Dr. Erin Hearing the possibility of postoperative Lovenox  Pictures obtained: _0   Post-op Rx sent to pharmacy:  Tramadol, bactrim and zofran  Patient reported that she did not do well with oxycodone in the past.  We discussed tramadol as an option and she would like to move forward with that.  I discussed with the patient to hold her Xanax the morning of surgery.  I discussed with her to not take Xanax at the same time is taking pain medications postoperatively.  Patient expressed understanding.  Patient was provided with the breast reconstruction and General Surgical Risk consent document and Pain Medication Agreement prior to their appointment.  They had adequate time to read through the risk consent documents and Pain Medication Agreement. We also discussed them in person together during this preop appointment. All of their questions were answered to their satisfaction.  Recommended calling if they have any further questions.  Risk consent form and Pain Medication Agreement to be scanned into patient's chart.  The risks that can be encountered with and after placement of a breast expander placement were discussed and include the following but not limited to these: bleeding, infection,  delayed healing,  anesthesia risks, skin sensation changes, injury to structures including nerves, blood vessels, and muscles which may be temporary or permanent, allergies to tape, suture materials and glues, blood products, topical preparations or injected agents, skin contour irregularities, skin discoloration and swelling, deep vein thrombosis, cardiac and pulmonary complications, pain, which may persist, fluid accumulation, wrinkling of the skin over the expander, changes in nipple or breast sensation, expander leakage or rupture, faulty position of the expander, persistent pain, formation of tight scar tissue around the expander (capsular contracture), possible need for revisional surgery or staged procedures.  I had a long conversation with the patient regarding the risks with expanders and that she will have to follow-up in the clinic regularly for expander fills.  Patient also called her daughter on the phone and we spoke about the process and the risks with her as well.  I described the expander fill process to her.  Patient expressed understanding and states that she still wants to move forward with surgery at this time.  I also discussed with the patient that Dr. Erin Hearing will no longer be with the practice in a few weeks.  I discussed with the patient that she can be switched to another surgeon at her practice for possible future surgeries.  Patient expressed understanding and is okay with that plan at this time.  I discussed today's visit with Dr. Erin Hearing.   Electronically signed by: [name], PA-C 10/16/2022 8:53 AM

## 2022-10-15 NOTE — Progress Notes (Signed)
Patient ID: Elizabeth Benton, female    DOB: Dec 26, 1946, 76 y.o.   MRN: [id number]  Chief Complaint  Patient presents with   Pre-op Exam      ICD-10-CM   1. 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  female  with a history of breast cancer.  She presents for preoperative evaluation for upcoming procedure, breast reconstruction with tissue expander and Flex HD placement, scheduled for 10/20/2022 with Dr.   Erin Hearing.  Patient will also be undergoing right mastectomy with lymph node biopsy and left total mastectomy with Dr. Barry Dienes on the same day.  The patient has not had problems with anesthesia.  Patient reports she has high blood pressure.  She denies being on any blood thinners.  She states that she is not a smoker.  Patient denies any use of birth control or hormone replacement.  She denies any history of miscarriages.  She denies any personal or family history of blood clots or clotting diseases.  Patient denies any recent surgeries, traumas, infections, strokes or heart attacks.  She denies any history of inflammatory bowel disease or lung disease.  She has a positive history for cancer.  She denies any varicose veins to her legs or swollen legs.  She denies any recent fevers or chills.  She denies any recent changes in her health.  Patient reports that she is currently a 81 D cup and would like to be a C cup ultimately.  Patient reported that she is still unsure if she would ultimately want autologous reconstruction after the expanders or if she would like to eventually take the expanders out and have no further reconstruction.  She states that she will not want implants ultimately and will want the expanders as a bridge to autologous reconstruction at this time.   Summary of Previous Visit: Patient was initially seen by Dr. Erin Hearing for  consult on 09/07/2022.  At this visit, patient reported that she was very interested in bilateral mastectomy but had yet to talk about it with Dr. Barry Dienes.  Patient at this visit reported that she was very interested in autologous reconstruction but is open to the idea of tissue expanders as a bridge to the autologous reconstruction.  Patient was found to be a candidate for tissue expander placement with acellular dermal matrix.  Plan was for patient to follow-up with Dr. Barry Dienes and proceed from there.  Patient was then seen again in the clinic on 09/17/2022.  At this visit, she reported that she is being offered bilateral mastectomy with Dr. Barry Dienes and reported that she desires tissue expanders to be placed although she is not sure ultimately what type of reconstruction she would like.  Plan was for patient to proceed with tissue expander reconstruction with acellular dermal matrix.  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  PMH Significant for: Hypertension, breast cancer  Chemotherapy/radiation: Patient was told that she will most likely not have chemotherapy or radiation.  Past Medical History: Allergies: Allergies  Allergen Reactions   Contrast Media [Iodinated Contrast Media] Anaphylaxis   Demerol Other (See Comments)    Patient stated she will pass out, unaware of surroundings. Last time she received it (for surgery) her stomach had to be pumped.  Iodine Anaphylaxis    Current Medications:  Current Outpatient Medications:    acetaminophen (TYLENOL) 650 MG CR tablet, Take 650 mg by mouth every 8 (eight) hours as needed for pain., Disp: , Rfl:    ALPRAZolam (XANAX) 0.25 MG tablet, Take 0.25-0.5 mg by mouth every 8 (eight) hours as needed for anxiety., Disp: , Rfl:    amLODipine (NORVASC) 5 MG tablet, Take 1.5 tablets (7.5 mg total) by mouth daily. (Patient taking differently: Take 5 mg by mouth daily.), Disp: 120  tablet, Rfl: 1   betamethasone dipropionate 0.05 % cream, Apply 1 Application topically 2 (two) times daily as needed for rash., Disp: , Rfl:    Carboxymethylcellul-Glycerin (LUBRICATING EYE DROPS OP), Place 1 drop into both eyes daily as needed (dry eyes)., Disp: , Rfl:    ondansetron (ZOFRAN) 4 MG tablet, Take 1 tablet (4 mg total) by mouth every 8 (eight) hours as needed for up to 20 doses for nausea or vomiting., Disp: 20 tablet, Rfl: 0   rosuvastatin (CRESTOR) 20 MG tablet, TAKE 1 TABLET BY MOUTH EVERY DAY, Disp: 90 tablet, Rfl: 3   sulfamethoxazole-trimethoprim (BACTRIM DS) 800-160 MG tablet, Take 1 tablet by mouth 2 (two) times daily for 14 days., Disp: 28 tablet, Rfl: 0   traMADol (ULTRAM) 50 MG tablet, Take 1 tablet (50 mg total) by mouth every 8 (eight) hours as needed for up to 20 doses., Disp: 20 tablet, Rfl: 0  Past Medical Problems: Past Medical History:  Diagnosis Date   Arthritis    Breast cancer ([place])    Depression    H/O migraine    HSV-2 (herpes simplex virus 2) infection    Hyperlipidemia    Hypertension    Lichen sclerosus    Migraine    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   Vaginal atrophy 12/29/2007   Vaginitis and vulvovaginitis 12/28/2004    Past Surgical History: Past Surgical History:  Procedure Laterality Date   ABDOMINAL HYSTERECTOMY  1987   DILATION AND CURETTAGE OF UTERUS     TONSILLECTOMY     TUBAL LIGATION     WISDOM TOOTH EXTRACTION      Social History: Social History   Socioeconomic History   Marital status: Married    Spouse name: Marlaysia Lenig   Number of children: 2   Years of education: Not on file   Highest education level: Not on file  Occupational History   Not on file  Tobacco Use   Smoking status: Former    Packs/day: 0.50    Years: 40.00    Total pack years: 20.00    Types: Cigarettes    Quit date: 01/22/1997    Years since quitting: 25.7   Smokeless tobacco: Never  Vaping Use   Vaping Use: Never used  Substance and Sexual  Activity   Alcohol use: Yes    Alcohol/week: 1.0 standard drink of alcohol    Types: 1 Standard drinks or equivalent per week    Comment: occ   Drug use: No   Sexual activity: Not Currently    Birth control/protection: Post-menopausal    Comment: hyst  Other Topics Concern   Not on file  Social History Narrative   Not on file   Social Determinants of Health   Financial Resource Strain: Not on file  Food Insecurity: Not on file  Transportation Needs: Not on file  Physical Activity: Not on file  Stress: Not on file  Social Connections: Not on file  Intimate Partner Violence: Not on file  Family History: Family History  Problem Relation Age of Onset   Stroke Mother    Throat cancer Father 27   Prostate cancer Brother 72   Breast cancer Maternal Aunt 65   Prostate cancer Maternal Uncle    Prostate cancer Maternal Uncle    Prostate cancer Maternal Uncle    Pancreatic cancer Paternal Uncle    Breast cancer Cousin        4 maternal first cousins   Prostate cancer Cousin    Breast cancer Other        maternal first cousin once removed   Other Other        BRCA+    Review of Systems: Denies recent fevers and chills. Denies any recent changes in health.   Physical Exam: Vital Signs BP (!) 147/87 (BP Location: Left Arm, Patient Position: Sitting, Cuff Size: Small)   Pulse 80   Ht 5' (1.524 m)   Wt 142 lb 6.4 oz (64.6 kg)   SpO2 97%   BMI 27.81 kg/m   Physical Exam  Constitutional:      General: Not in acute distress.    Appearance: Normal appearance. Not ill-appearing.  HENT:     Head: Normocephalic and atraumatic.  Neck:     Musculoskeletal: Normal range of motion.  Cardiovascular:     Rate and Rhythm: Normal rate Pulmonary:     Effort: Pulmonary effort is normal. No respiratory distress.  Musculoskeletal: Normal range of motion.  Skin:    General: Skin is warm and dry.     Findings: No erythema or rash.  Neurological:     Mental Status: Alert and  oriented to person, place, and time. Mental status is at baseline.  Psychiatric:        Mood and Affect: Mood normal.        Behavior: Behavior normal.    Assessment/Plan: The patient is scheduled for bilateral breast reconstruction with tissue expander and Flex HD placement with Dr.  Erin Hearing .  Risks, benefits, and alternatives of procedure discussed, questions answered and consent obtained.    Smoking Status: Non-smoker; Counseling Given?  N/A I  Caprini Score: 8; Risk Factors include: Age, BMI greater than 25, history of malignancy , and length of planned surgery. Recommendation for mechanical and possible pharmacological prophylaxis. Encourage early ambulation.  I will discuss with Dr. Erin Hearing the possibility of postoperative Lovenox  Pictures obtained: _0   Post-op Rx sent to pharmacy:  Tramadol, bactrim and zofran  Patient reported that she did not do well with oxycodone in the past.  We discussed tramadol as an option and she would like to move forward with that.  I discussed with the patient to hold her Xanax the morning of surgery.  I discussed with her to not take Xanax at the same time is taking pain medications postoperatively.  Patient expressed understanding.  Patient was provided with the breast reconstruction and General Surgical Risk consent document and Pain Medication Agreement prior to their appointment.  They had adequate time to read through the risk consent documents and Pain Medication Agreement. We also discussed them in person together during this preop appointment. All of their questions were answered to their satisfaction.  Recommended calling if they have any further questions.  Risk consent form and Pain Medication Agreement to be scanned into patient's chart.  The risks that can be encountered with and after placement of a breast expander placement were discussed and include the following but not limited to these: bleeding, infection,  delayed healing,  anesthesia risks, skin sensation changes, injury to structures including nerves, blood vessels, and muscles which may be temporary or permanent, allergies to tape, suture materials and glues, blood products, topical preparations or injected agents, skin contour irregularities, skin discoloration and swelling, deep vein thrombosis, cardiac and pulmonary complications, pain, which may persist, fluid accumulation, wrinkling of the skin over the expander, changes in nipple or breast sensation, expander leakage or rupture, faulty position of the expander, persistent pain, formation of tight scar tissue around the expander (capsular contracture), possible need for revisional surgery or staged procedures.  I had a long conversation with the patient regarding the risks with expanders and that she will have to follow-up in the clinic regularly for expander fills.  Patient also called her daughter on the phone and we spoke about the process and the risks with her as well.  I described the expander fill process to her.  Patient expressed understanding and states that she still wants to move forward with surgery at this time.  I also discussed with the patient that Dr. Erin Hearing will no longer be with the practice in a few weeks.  I discussed with the patient that she can be switched to another surgeon at her practice for possible future surgeries.  Patient expressed understanding and is okay with that plan at this time.  I discussed today's visit with Dr. Erin Hearing.   Electronically signed by: [name], PA-C 10/16/2022 8:53 AM

## 2022-10-19 NOTE — H&P (Signed)
PROVIDER:  Georgianne Fick, MD Patient Care Team: Marlyce Huge, MD as PCP - General (Internal Medicine) Georgianne Fick, MD as Consulting Provider (Surgical Oncology) Vernell Leep, MD (Cardiothoracic Vascular Surgery) Blair Promise, MD (Radiation Oncology) Rulon Eisenmenger, MD (Hematology and Oncology)   MRN: [id number] DOB: [birth year]    Chief Complaint: Breast Cancer   History of Present Illness: Elizabeth Benton is a 76 y.o. female who is seen today for breast cancer follow up. .   Initial history:    Pt presented with a new diagnosis of right breast cancer 06/2022.  She had screening detected bilateral breast asymmetry.  Diagnostic imaging was performed.  This resolved the left asymmetry as probably benign.  On the right a 1.2 cm mass was seen at 11 o'clock.  Core needle biopsies were performed.  Left side was fibrosis.  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      Family cancer history Her father had throat cancer.  Two maternal aunts had breast cancer and a maternal cousin had breast cancer.   Menarche 5 Menopause surgical menopause with ovarian removal 1987 Parity G2P2 with first child age 67. One child has passed.     Work: She works as a Surveyor, quantity at [employer].  She worked there for a long time, then retired, the started working again!     Interval history:    She underwent breast MRI 08/18/2022 which did not show any additional findings other than the cancer was slightly larger than thought.  (2 cm instead of 1.2 cm).  She saw Dr. Erin Hearing and wants bilateral mastectomies with expander based reconstruction initially.  She isn't sure yet if she wants autologous tissue or implants, but wants to preserve the space.     Review of Systems: A complete review of systems was obtained from the patient.  I have reviewed this information and discussed as appropriate with the patient.  See HPI as well for other ROS.   Review  of Systems  All other systems reviewed and are negative.      Medical History: Past Medical History      Past Medical History:  Diagnosis Date   Anxiety     Arthritis     History of cancer     Hyperlipidemia             Patient Active Problem List  Diagnosis   Depression   Essential hypertension   Exertional dyspnea   Malignant neoplasm of upper-outer quadrant of right breast in female, estrogen receptor positive (CMS-HCC)   Lumbago with sciatica   Mixed hyperlipidemia   Osteopenia   Vaginal atrophy   Vitamin D deficiency   HSV-2 (herpes simplex virus 2) infection   Chills   Easy bruising   Bleeds easily (CMS-HCC)   Screening for malignant neoplasm of colon   Family history of breast cancer      Past Surgical History       Past Surgical History:  Procedure Laterality Date   D&C uterus        Unknown date   ESSURE TUBAL LIGATION        Unknown date   HYSTERECTOMY VAGINAL        Unknown date   TONSILLECTOMY        Unknown date        Allergies       Allergies  Allergen Reactions   Iodinated Contrast Media Anaphylaxis   Iodine Anaphylaxis  Meperidine Other (See Comments)      Patient stated she will pass out, unaware of surroundings. Last time she received it (for surgery) her stomach had to be pumped.   Shellfish Containing Products Shortness Of Breath and Swelling              Current Outpatient Medications on File Prior to Visit  Medication Sig Dispense Refill   amLODIPine (NORVASC) 5 MG tablet Take 7.5 mg by mouth once daily Pt prefers to take 1 tab in the am and 1/2 tabs in the pm       cholecalciferol (VITAMIN D3) 5,000 unit capsule Take 5,000 Units by mouth once daily       hydrocortisone 2.5 % cream Apply 1 Application! topically 2 (two) times daily Apply to affected area and surrounding skin 2x a day, in the morning and evening       rosuvastatin (CRESTOR) 20 MG tablet Take 20 mg by mouth once daily        No current facility-administered  medications on file prior to visit.      Family History       Family History  Problem Relation Age of Onset   High blood pressure (Hypertension) Mother     Skin cancer Mother          Social History        Tobacco Use  Smoking Status Former   Types: Cigarettes   Quit date: 01/22/1997   Years since quitting: 25.6  Smokeless Tobacco Never      Social History  Social History         Socioeconomic History   Marital status: Married  Tobacco Use   Smoking status: Former      Types: Cigarettes      Quit date: 01/22/1997      Years since quitting: 25.6   Smokeless tobacco: Never  Vaping Use   Vaping Use: Never used  Substance and Sexual Activity   Alcohol use: Never   Drug use: Never   Sexual activity: Defer        Objective:         Vitals:      BP: 138/78  Pulse: 84  Weight: 64 kg (141 lb)  Height: 152.4 cm (5')    Body mass index is 27.54 kg/m.   Head:   Normocephalic and atraumatic.  Eyes:    Conjunctivae are normal. Pupils are equal, round, and reactive to light. No scleral icterus.  Neck:   Normal range of motion. Neck supple. No tracheal deviation present. No thyromegaly present.  Resp:   No respiratory distress, normal effort. Breast: ptotic bilaterally.  No palpable mass.  No LAD.   Neurological: Alert and oriented to person, place, and time. Coordination normal.  Skin:    Skin is warm and dry. No rash noted. No diaphoretic. No erythema. No pallor.  Psychiatric: Normal mood and affect. Normal behavior. Judgment and thought content normal.      Labs, Imaging and Diagnostic Testing:   None new.       Assessment and Plan:     Diagnoses and all orders for this visit:   Malignant neoplasm of upper-outer quadrant of right breast in female, estrogen receptor positive (CMS-HCC)   Family history of breast cancer   Patient has a current diagnosis of right breast cancer.  This is somewhere between a clinical T1c and T2 depending on the imaging  study.  We will plan to switch our surgical plan  to bilateral mastectomies.  Given the potential size of 2 cm, I would go ahead and do a right sentinel lymph node biopsy.   I discussed the surgery with the patient including the anatomy of the breast and location of breast tissue.  I also reviewed incisions and that we would go all the way to the collarbone, the side of the breastbone, the fold under the breast, and the latissimus muscle.  I discussed risks of bleeding, infection, damage to adjacent structures, numbness, pain, dissatisfaction with reconstruction, loss of reconstruction, heart or lung issues.   I discussed that there is a risk of having to go back to the operating room.  I discussed risk of blood transfusion.  Patient understands and wishes to proceed.  Our schedulers (Dr. Erin Hearing) are already working together to get this set up.

## 2022-10-20 ENCOUNTER — Inpatient Hospital Stay (HOSPITAL_COMMUNITY)
Admission: RE | Admit: 2022-10-20 | Discharge: 2022-10-22 | DRG: 581 | Disposition: A | Discharge status: Home / Self Care | Payer: 59 | Attending: General Surgery | Admitting: General Surgery

## 2022-10-20 ENCOUNTER — Encounter (HOSPITAL_COMMUNITY)
Admission: RE | Disposition: A | Discharge status: Home / Self Care | Payer: Self-pay | Source: Home / Self Care | Attending: General Surgery

## 2022-10-20 ENCOUNTER — Ambulatory Visit (HOSPITAL_COMMUNITY): Payer: 59 | Admitting: Certified Registered Nurse Anesthetist

## 2022-10-20 ENCOUNTER — Other Ambulatory Visit: Payer: Self-pay

## 2022-10-20 ENCOUNTER — Encounter (HOSPITAL_COMMUNITY): Payer: Self-pay | Admitting: General Surgery

## 2022-10-20 DIAGNOSIS — Z170 Estrogen receptor positive status [ER+]: Secondary | ICD-10-CM

## 2022-10-20 DIAGNOSIS — N952 Postmenopausal atrophic vaginitis: Secondary | ICD-10-CM | POA: Diagnosis present

## 2022-10-20 DIAGNOSIS — A6009 Herpesviral infection of other urogenital tract: Secondary | ICD-10-CM | POA: Diagnosis present

## 2022-10-20 DIAGNOSIS — Z421 Encounter for breast reconstruction following mastectomy: Secondary | ICD-10-CM | POA: Diagnosis not present

## 2022-10-20 DIAGNOSIS — Z91013 Allergy to seafood: Secondary | ICD-10-CM

## 2022-10-20 DIAGNOSIS — I10 Essential (primary) hypertension: Secondary | ICD-10-CM | POA: Diagnosis present

## 2022-10-20 DIAGNOSIS — M1990 Unspecified osteoarthritis, unspecified site: Secondary | ICD-10-CM | POA: Diagnosis present

## 2022-10-20 DIAGNOSIS — F419 Anxiety disorder, unspecified: Secondary | ICD-10-CM | POA: Diagnosis present

## 2022-10-20 DIAGNOSIS — C50411 Malignant neoplasm of upper-outer quadrant of right female breast: Principal | ICD-10-CM | POA: Diagnosis present

## 2022-10-20 DIAGNOSIS — Z91041 Radiographic dye allergy status: Secondary | ICD-10-CM

## 2022-10-20 DIAGNOSIS — M8580 Other specified disorders of bone density and structure, unspecified site: Secondary | ICD-10-CM | POA: Diagnosis present

## 2022-10-20 DIAGNOSIS — Z888 Allergy status to other drugs, medicaments and biological substances status: Secondary | ICD-10-CM

## 2022-10-20 DIAGNOSIS — Z90710 Acquired absence of both cervix and uterus: Secondary | ICD-10-CM

## 2022-10-20 DIAGNOSIS — D62 Acute posthemorrhagic anemia: Secondary | ICD-10-CM | POA: Diagnosis not present

## 2022-10-20 DIAGNOSIS — C50811 Malignant neoplasm of overlapping sites of right female breast: Secondary | ICD-10-CM

## 2022-10-20 DIAGNOSIS — Z803 Family history of malignant neoplasm of breast: Secondary | ICD-10-CM

## 2022-10-20 DIAGNOSIS — Z79891 Long term (current) use of opiate analgesic: Secondary | ICD-10-CM

## 2022-10-20 DIAGNOSIS — E782 Mixed hyperlipidemia: Secondary | ICD-10-CM | POA: Diagnosis present

## 2022-10-20 DIAGNOSIS — Z9889 Other specified postprocedural states: Secondary | ICD-10-CM

## 2022-10-20 DIAGNOSIS — C50919 Malignant neoplasm of unspecified site of unspecified female breast: Secondary | ICD-10-CM | POA: Diagnosis present

## 2022-10-20 DIAGNOSIS — Z79899 Other long term (current) drug therapy: Secondary | ICD-10-CM

## 2022-10-20 DIAGNOSIS — Z87891 Personal history of nicotine dependence: Secondary | ICD-10-CM

## 2022-10-20 DIAGNOSIS — R110 Nausea: Secondary | ICD-10-CM | POA: Diagnosis not present

## 2022-10-20 DIAGNOSIS — Z90721 Acquired absence of ovaries, unilateral: Secondary | ICD-10-CM

## 2022-10-20 DIAGNOSIS — F32A Depression, unspecified: Secondary | ICD-10-CM | POA: Diagnosis present

## 2022-10-20 DIAGNOSIS — Z885 Allergy status to narcotic agent status: Secondary | ICD-10-CM

## 2022-10-20 DIAGNOSIS — E559 Vitamin D deficiency, unspecified: Secondary | ICD-10-CM | POA: Diagnosis present

## 2022-10-20 DIAGNOSIS — Z8249 Family history of ischemic heart disease and other diseases of the circulatory system: Secondary | ICD-10-CM

## 2022-10-20 HISTORY — PX: TOTAL MASTECTOMY: SHX6129

## 2022-10-20 HISTORY — PX: BREAST RECONSTRUCTION WITH PLACEMENT OF TISSUE EXPANDER AND FLEX HD (ACELLULAR HYDRATED DERMIS): SHX6295

## 2022-10-20 HISTORY — PX: MASTECTOMY W/ SENTINEL NODE BIOPSY: SHX2001

## 2022-10-20 SURGERY — MASTECTOMY WITH SENTINEL LYMPH NODE BIOPSY
Anesthesia: Regional | Site: Breast | Laterality: Right

## 2022-10-20 MED ORDER — CHLORHEXIDINE GLUCONATE CLOTH 2 % EX PADS: 6.0000 | MEDICATED_PAD | Freq: Once | CUTANEOUS | Status: DC

## 2022-10-20 MED ORDER — OXYCODONE HCL 5 MG PO TABS: 5.0000 mg | ORAL_TABLET | ORAL | Status: DC | PRN

## 2022-10-20 MED ORDER — LACTATED RINGERS IV SOLN: INTRAVENOUS | Status: DC

## 2022-10-20 MED ORDER — ALPRAZOLAM 0.25 MG PO TABS: 0.2500 mg | ORAL_TABLET | Freq: Three times a day (TID) | ORAL | Status: DC | PRN

## 2022-10-20 MED ORDER — ONDANSETRON 4 MG PO TBDP
4.0000 mg | ORAL_TABLET | Freq: Four times a day (QID) | ORAL | Status: DC | PRN
  Administered 2022-10-21 – 2022-10-22 (×2): 4 mg via ORAL
  Filled 2022-10-20 (×2): qty 1

## 2022-10-20 MED ORDER — 0.9 % SODIUM CHLORIDE (POUR BTL) OPTIME
TOPICAL | Status: DC | PRN
  Administered 2022-10-20 (×2): 1000 mL

## 2022-10-20 MED ORDER — MORPHINE SULFATE (PF) 2 MG/ML IV SOLN
1.0000 mg | INTRAVENOUS | Status: DC | PRN
  Administered 2022-10-20: 2 mg via INTRAVENOUS
  Filled 2022-10-20 (×2): qty 1

## 2022-10-20 MED ORDER — METHOCARBAMOL 1000 MG/10ML IJ SOLN: 500.0000 mg | Freq: Three times a day (TID) | INTRAVENOUS | Status: DC | PRN

## 2022-10-20 MED ORDER — ONDANSETRON HCL 4 MG/2ML IJ SOLN: 4.0000 mg | Freq: Four times a day (QID) | INTRAMUSCULAR | Status: DC | PRN

## 2022-10-20 MED ORDER — DEXAMETHASONE SODIUM PHOSPHATE 10 MG/ML IJ SOLN
INTRAMUSCULAR | Status: AC
  Filled 2022-10-20: qty 1

## 2022-10-20 MED ORDER — SODIUM CHLORIDE 0.9 % IV SOLN
Freq: Once | INTRAVENOUS | Status: DC
  Filled 2022-10-20: qty 10

## 2022-10-20 MED ORDER — ACETAMINOPHEN 325 MG PO TABS: 650.0000 mg | ORAL_TABLET | Freq: Three times a day (TID) | ORAL | Status: DC | PRN

## 2022-10-20 MED ORDER — DIPHENHYDRAMINE HCL 50 MG/ML IJ SOLN: 12.5000 mg | Freq: Four times a day (QID) | INTRAMUSCULAR | Status: DC | PRN

## 2022-10-20 MED ORDER — SENNA 8.6 MG PO TABS
1.0000 | ORAL_TABLET | Freq: Two times a day (BID) | ORAL | Status: DC
  Administered 2022-10-20 – 2022-10-22 (×4): 8.6 mg via ORAL
  Filled 2022-10-20 (×4): qty 1

## 2022-10-20 MED ORDER — OXYCODONE HCL 5 MG PO TABS: 5.0000 mg | ORAL_TABLET | Freq: Once | ORAL | Status: DC | PRN

## 2022-10-20 MED ORDER — ACETAMINOPHEN 160 MG/5ML PO SOLN: 1000.0000 mg | Freq: Once | ORAL | Status: DC | PRN

## 2022-10-20 MED ORDER — PROPOFOL 500 MG/50ML IV EMUL
INTRAVENOUS | Status: DC | PRN
  Administered 2022-10-20: 100 ug/kg/min via INTRAVENOUS

## 2022-10-20 MED ORDER — PROPOFOL 10 MG/ML IV BOLUS
INTRAVENOUS | Status: DC | PRN
  Administered 2022-10-20: 40 mg via INTRAVENOUS
  Administered 2022-10-20: 120 mg via INTRAVENOUS

## 2022-10-20 MED ORDER — SUGAMMADEX SODIUM 200 MG/2ML IV SOLN: INTRAVENOUS | Status: DC | PRN

## 2022-10-20 MED ORDER — ACETAMINOPHEN 500 MG PO TABS
1000.0000 mg | ORAL_TABLET | ORAL | Status: AC
  Administered 2022-10-20: 1000 mg via ORAL
  Filled 2022-10-20: qty 2

## 2022-10-20 MED ORDER — LIDOCAINE 2% (20 MG/ML) 5 ML SYRINGE
INTRAMUSCULAR | Status: AC
  Filled 2022-10-20: qty 5

## 2022-10-20 MED ORDER — PROCHLORPERAZINE MALEATE 10 MG PO TABS: 10.0000 mg | ORAL_TABLET | Freq: Four times a day (QID) | ORAL | Status: DC | PRN

## 2022-10-20 MED ORDER — ROCURONIUM BROMIDE 10 MG/ML (PF) SYRINGE
PREFILLED_SYRINGE | INTRAVENOUS | Status: AC
  Filled 2022-10-20: qty 10

## 2022-10-20 MED ORDER — ROSUVASTATIN CALCIUM 20 MG PO TABS
20.0000 mg | ORAL_TABLET | Freq: Every day | ORAL | Status: DC
  Administered 2022-10-21 – 2022-10-22 (×2): 20 mg via ORAL
  Filled 2022-10-20 (×2): qty 1

## 2022-10-20 MED ORDER — IBUPROFEN 600 MG PO TABS
600.0000 mg | ORAL_TABLET | Freq: Four times a day (QID) | ORAL | Status: DC | PRN
  Filled 2022-10-20: qty 1

## 2022-10-20 MED ORDER — FENTANYL CITRATE (PF) 100 MCG/2ML IJ SOLN
25.0000 ug | INTRAMUSCULAR | Status: DC | PRN
  Administered 2022-10-20 (×4): 25 ug via INTRAVENOUS

## 2022-10-20 MED ORDER — CHLORHEXIDINE GLUCONATE 0.12 % MT SOLN
15.0000 mL | Freq: Once | OROMUCOSAL | Status: AC
  Administered 2022-10-20: 15 mL via OROMUCOSAL

## 2022-10-20 MED ORDER — KETAMINE HCL 50 MG/5ML IJ SOSY
PREFILLED_SYRINGE | INTRAMUSCULAR | Status: AC
  Filled 2022-10-20: qty 5

## 2022-10-20 MED ORDER — TRAMADOL HCL 50 MG PO TABS
50.0000 mg | ORAL_TABLET | Freq: Three times a day (TID) | ORAL | Status: DC | PRN
  Administered 2022-10-20: 50 mg via ORAL

## 2022-10-20 MED ORDER — SODIUM CHLORIDE 0.9 % IV SOLN
INTRAVENOUS | Status: DC | PRN
  Administered 2022-10-20 (×2): 512 mL

## 2022-10-20 MED ORDER — ACETAMINOPHEN 500 MG PO TABS: 1000.0000 mg | ORAL_TABLET | Freq: Once | ORAL | Status: DC | PRN

## 2022-10-20 MED ORDER — CHLORHEXIDINE GLUCONATE 0.12 % MT SOLN
15.0000 mL | Freq: Once | OROMUCOSAL | Status: DC
  Filled 2022-10-20: qty 15

## 2022-10-20 MED ORDER — ORAL CARE MOUTH RINSE: 15.0000 mL | Freq: Once | OROMUCOSAL | Status: DC

## 2022-10-20 MED ORDER — ACETAMINOPHEN 325 MG PO TABS
ORAL_TABLET | ORAL | Status: AC
  Filled 2022-10-20: qty 2

## 2022-10-20 MED ORDER — PROCHLORPERAZINE EDISYLATE 10 MG/2ML IJ SOLN
5.0000 mg | Freq: Four times a day (QID) | INTRAMUSCULAR | Status: DC | PRN
  Administered 2022-10-20 – 2022-10-21 (×2): 10 mg via INTRAVENOUS
  Filled 2022-10-20 (×2): qty 2

## 2022-10-20 MED ORDER — MIDAZOLAM HCL 2 MG/2ML IJ SOLN
INTRAMUSCULAR | Status: AC
  Filled 2022-10-20: qty 2

## 2022-10-20 MED ORDER — KETOROLAC TROMETHAMINE 30 MG/ML IJ SOLN
INTRAMUSCULAR | Status: DC | PRN
  Administered 2022-10-20: 30 mg via INTRAVENOUS

## 2022-10-20 MED ORDER — ACETAMINOPHEN 500 MG PO TABS
1000.0000 mg | ORAL_TABLET | Freq: Four times a day (QID) | ORAL | Status: DC
  Administered 2022-10-20 – 2022-10-21 (×3): 1000 mg via ORAL
  Filled 2022-10-20 (×3): qty 2

## 2022-10-20 MED ORDER — LIDOCAINE 2% (20 MG/ML) 5 ML SYRINGE
INTRAMUSCULAR | Status: DC | PRN
  Administered 2022-10-20: 60 mg via INTRAVENOUS

## 2022-10-20 MED ORDER — DIPHENHYDRAMINE HCL 12.5 MG/5ML PO ELIX: 12.5000 mg | ORAL_SOLUTION | Freq: Four times a day (QID) | ORAL | Status: DC | PRN

## 2022-10-20 MED ORDER — ONDANSETRON HCL 4 MG/2ML IJ SOLN
4.0000 mg | Freq: Four times a day (QID) | INTRAMUSCULAR | Status: DC | PRN
  Administered 2022-10-20 – 2022-10-22 (×3): 4 mg via INTRAVENOUS
  Filled 2022-10-20 (×3): qty 2

## 2022-10-20 MED ORDER — ONDANSETRON HCL 4 MG/2ML IJ SOLN
INTRAMUSCULAR | Status: DC | PRN
  Administered 2022-10-20: 4 mg via INTRAVENOUS

## 2022-10-20 MED ORDER — FENTANYL CITRATE (PF) 250 MCG/5ML IJ SOLN
INTRAMUSCULAR | Status: DC | PRN
  Administered 2022-10-20 (×3): 50 ug via INTRAVENOUS

## 2022-10-20 MED ORDER — PROPOFOL 10 MG/ML IV BOLUS
INTRAVENOUS | Status: AC
  Filled 2022-10-20: qty 20

## 2022-10-20 MED ORDER — AMLODIPINE BESYLATE 5 MG PO TABS
5.0000 mg | ORAL_TABLET | Freq: Every day | ORAL | Status: DC
  Administered 2022-10-21 – 2022-10-22 (×2): 5 mg via ORAL
  Filled 2022-10-20 (×2): qty 1

## 2022-10-20 MED ORDER — SODIUM CHLORIDE 0.9 % IR SOLN
Status: DC | PRN
  Administered 2022-10-20: 500 mL
  Administered 2022-10-20: 1000 mL

## 2022-10-20 MED ORDER — ORAL CARE MOUTH RINSE: 15.0000 mL | Freq: Once | OROMUCOSAL | Status: AC

## 2022-10-20 MED ORDER — PROPOFOL 1000 MG/100ML IV EMUL
INTRAVENOUS | Status: AC
  Filled 2022-10-20: qty 100

## 2022-10-20 MED ORDER — KCL IN DEXTROSE-NACL 20-5-0.45 MEQ/L-%-% IV SOLN
INTRAVENOUS | Status: AC
  Filled 2022-10-20 (×2): qty 1000

## 2022-10-20 MED ORDER — TRIAMCINOLONE ACETONIDE 0.5 % EX CREA
TOPICAL_CREAM | Freq: Two times a day (BID) | CUTANEOUS | Status: DC
  Filled 2022-10-20: qty 15

## 2022-10-20 MED ORDER — FENTANYL CITRATE (PF) 250 MCG/5ML IJ SOLN
INTRAMUSCULAR | Status: AC
  Filled 2022-10-20: qty 5

## 2022-10-20 MED ORDER — SULFAMETHOXAZOLE-TRIMETHOPRIM 800-160 MG PO TABS
1.0000 | ORAL_TABLET | Freq: Two times a day (BID) | ORAL | Status: DC
  Administered 2022-10-20 – 2022-10-22 (×4): 1 via ORAL
  Filled 2022-10-20 (×4): qty 1

## 2022-10-20 MED ORDER — MORPHINE SULFATE (PF) 2 MG/ML IV SOLN: 2.0000 mg | INTRAVENOUS | Status: DC | PRN

## 2022-10-20 MED ORDER — EPHEDRINE SULFATE-NACL 50-0.9 MG/10ML-% IV SOSY: PREFILLED_SYRINGE | INTRAVENOUS | Status: DC | PRN

## 2022-10-20 MED ORDER — BUPIVACAINE-EPINEPHRINE (PF) 0.25% -1:200000 IJ SOLN
INTRAMUSCULAR | Status: AC
  Filled 2022-10-20: qty 30

## 2022-10-20 MED ORDER — ACETAMINOPHEN 10 MG/ML IV SOLN: 1000.0000 mg | Freq: Once | INTRAVENOUS | Status: DC | PRN

## 2022-10-20 MED ORDER — POLYETHYLENE GLYCOL 3350 17 G PO PACK: 17.0000 g | PACK | Freq: Every day | ORAL | Status: DC | PRN

## 2022-10-20 MED ORDER — ONDANSETRON HCL 4 MG/2ML IJ SOLN
INTRAMUSCULAR | Status: AC
  Filled 2022-10-20: qty 2

## 2022-10-20 MED ORDER — MELATONIN 3 MG PO TABS: 3.0000 mg | ORAL_TABLET | Freq: Every evening | ORAL | Status: DC | PRN

## 2022-10-20 MED ORDER — OXYCODONE HCL 5 MG/5ML PO SOLN: 5.0000 mg | Freq: Once | ORAL | Status: DC | PRN

## 2022-10-20 MED ORDER — CARBOXYMETHYLCELLUL-GLYCERIN 0.5-0.9 % OP SOLN: 2.0000 [drp] | Freq: Every day | OPHTHALMIC | Status: DC | PRN

## 2022-10-20 MED ORDER — ROCURONIUM BROMIDE 10 MG/ML (PF) SYRINGE
PREFILLED_SYRINGE | INTRAVENOUS | Status: DC | PRN
  Administered 2022-10-20: 70 mg via INTRAVENOUS
  Administered 2022-10-20: 30 mg via INTRAVENOUS
  Administered 2022-10-20 (×2): 20 mg via INTRAVENOUS

## 2022-10-20 MED ORDER — PROPOFOL 1000 MG/100ML IV EMUL
INTRAVENOUS | Status: AC
  Filled 2022-10-20: qty 300

## 2022-10-20 MED ORDER — MIDAZOLAM HCL 2 MG/2ML IJ SOLN
INTRAMUSCULAR | Status: DC | PRN
  Administered 2022-10-20 (×2): 1 mg via INTRAVENOUS

## 2022-10-20 MED ORDER — PHENYLEPHRINE HCL-NACL 20-0.9 MG/250ML-% IV SOLN
INTRAVENOUS | Status: DC | PRN
  Administered 2022-10-20: 20 ug/min via INTRAVENOUS

## 2022-10-20 MED ORDER — ONDANSETRON 4 MG PO TBDP: 4.0000 mg | ORAL_TABLET | Freq: Four times a day (QID) | ORAL | Status: DC | PRN

## 2022-10-20 MED ORDER — DEXAMETHASONE SODIUM PHOSPHATE 10 MG/ML IJ SOLN
INTRAMUSCULAR | Status: DC | PRN
  Administered 2022-10-20: 10 mg via INTRAVENOUS

## 2022-10-20 MED ORDER — SUGAMMADEX SODIUM 200 MG/2ML IV SOLN
INTRAVENOUS | Status: DC | PRN
  Administered 2022-10-20: 200 mg via INTRAVENOUS

## 2022-10-20 MED ORDER — METHOCARBAMOL 500 MG PO TABS: 500.0000 mg | ORAL_TABLET | Freq: Three times a day (TID) | ORAL | Status: DC | PRN

## 2022-10-20 MED ORDER — FENTANYL CITRATE (PF) 100 MCG/2ML IJ SOLN
INTRAMUSCULAR | Status: AC
  Filled 2022-10-20: qty 2

## 2022-10-20 MED ORDER — METHOCARBAMOL 500 MG PO TABS: 500.0000 mg | ORAL_TABLET | Freq: Four times a day (QID) | ORAL | Status: DC | PRN

## 2022-10-20 MED ORDER — MAGTRACE LYMPHATIC TRACER
INTRAMUSCULAR | Status: DC | PRN
  Administered 2022-10-20: 2 mL via INTRAMUSCULAR

## 2022-10-20 MED ORDER — CEFAZOLIN SODIUM-DEXTROSE 2-4 GM/100ML-% IV SOLN
2.0000 g | INTRAVENOUS | Status: AC
  Administered 2022-10-20: 2 g via INTRAVENOUS
  Filled 2022-10-20: qty 100

## 2022-10-20 MED ORDER — KETAMINE HCL 10 MG/ML IJ SOLN
INTRAMUSCULAR | Status: DC | PRN
  Administered 2022-10-20: 20 mg via INTRAVENOUS

## 2022-10-20 MED ORDER — TRAMADOL HCL 50 MG PO TABS
ORAL_TABLET | ORAL | Status: AC
  Filled 2022-10-20: qty 1

## 2022-10-20 SURGICAL SUPPLY — 99 items
ADH SKN CLS APL DERMABOND .7 (GAUZE/BANDAGES/DRESSINGS)
APL PRP STRL LF DISP 70% ISPRP (MISCELLANEOUS) ×6
BAG COUNTER SPONGE SURGICOUNT (BAG) ×6 IMPLANT
BAG DECANTER FOR FLEXI CONT (MISCELLANEOUS) ×3 IMPLANT
BAG SPNG CNTER NS LX DISP (BAG) ×6
BINDER BREAST LRG (GAUZE/BANDAGES/DRESSINGS) IMPLANT
BINDER BREAST XLRG (GAUZE/BANDAGES/DRESSINGS) IMPLANT
BIOPATCH RED 1 DISK 7.0 (GAUZE/BANDAGES/DRESSINGS) ×9 IMPLANT
BNDG COHESIVE 4X5 TAN STRL (GAUZE/BANDAGES/DRESSINGS) ×3 IMPLANT
CANISTER SUCT 3000ML PPV (MISCELLANEOUS) ×9 IMPLANT
CHLORAPREP W/TINT 26 (MISCELLANEOUS) ×6 IMPLANT
CLIP TI MEDIUM 24 (CLIP) IMPLANT
CLIP VESOCCLUDE LG 6/CT (CLIP) ×3 IMPLANT
CLIP VESOCCLUDE MED 6/CT (CLIP) ×3 IMPLANT
CLIP VESOCCLUDE SM WIDE 6/CT (CLIP) ×3 IMPLANT
CNTNR URN SCR LID CUP LEK RST (MISCELLANEOUS) ×3 IMPLANT
CONT SPEC 4OZ STRL OR WHT (MISCELLANEOUS) ×6
COVER PROBE W GEL 5X96 (DRAPES) ×3 IMPLANT
COVER SURGICAL LIGHT HANDLE (MISCELLANEOUS) ×6 IMPLANT
DERMABOND ADVANCED .7 DNX12 (GAUZE/BANDAGES/DRESSINGS) ×6 IMPLANT
DRAIN CHANNEL 15F RND FF W/TCR (WOUND CARE) IMPLANT
DRAIN CHANNEL 19F RND (DRAIN) ×6 IMPLANT
DRAPE CHEST BREAST 15X10 FENES (DRAPES) ×3 IMPLANT
DRAPE HALF SHEET 40X57 (DRAPES) ×6 IMPLANT
DRAPE ORTHO SPLIT 77X108 STRL (DRAPES)
DRAPE SURG ORHT 6 SPLT 77X108 (DRAPES) ×6 IMPLANT
DRAPE WARM FLUID 44X44 (DRAPES) ×3 IMPLANT
DRSG MEPILEX POST OP 4X8 (GAUZE/BANDAGES/DRESSINGS) IMPLANT
DRSG TEGADERM 4X4.75 (GAUZE/BANDAGES/DRESSINGS) ×6 IMPLANT
ELECT BLADE 4.0 EZ CLEAN MEGAD (MISCELLANEOUS) ×6
ELECT CAUTERY BLADE 6.4 (BLADE) ×3 IMPLANT
ELECT REM PT RETURN 9FT ADLT (ELECTROSURGICAL) ×3
ELECTRODE BLDE 4.0 EZ CLN MEGD (MISCELLANEOUS) ×3 IMPLANT
ELECTRODE REM PT RTRN 9FT ADLT (ELECTROSURGICAL) ×6 IMPLANT
EVACUATOR SILICONE 100CC (DRAIN) ×6 IMPLANT
FILTER STRAW FLUID ASPIR (MISCELLANEOUS) IMPLANT
FUNNEL KELLER 2 DISP (MISCELLANEOUS) IMPLANT
GAUZE PAD ABD 8X10 STRL (GAUZE/BANDAGES/DRESSINGS) ×9 IMPLANT
GAUZE SPONGE 4X4 12PLY STRL (GAUZE/BANDAGES/DRESSINGS) ×6 IMPLANT
GLOVE BIO SURGEON STRL SZ 6 (GLOVE) ×3 IMPLANT
GLOVE BIO SURGEON STRL SZ7.5 (GLOVE) ×3 IMPLANT
GLOVE BIOGEL PI IND STRL 7.5 (GLOVE) ×3 IMPLANT
GLOVE INDICATOR 6.5 STRL GRN (GLOVE) ×3 IMPLANT
GOWN STRL REUS W/ TWL LRG LVL3 (GOWN DISPOSABLE) ×9 IMPLANT
GOWN STRL REUS W/TWL 2XL LVL3 (GOWN DISPOSABLE) ×3 IMPLANT
GOWN STRL REUS W/TWL LRG LVL3 (GOWN DISPOSABLE) ×9
GOWN STRL REUS W/TWL XL LVL3 (GOWN DISPOSABLE) ×3 IMPLANT
GRAFT FLEX HD 19X22X0.7-1.4 (Tissue) IMPLANT
ILLUMINATOR WAVEGUIDE N/F (MISCELLANEOUS) IMPLANT
IMPL EXPANDER BREAST 650CC (Breast) IMPLANT
IMPLANT EXPANDER BREAST 650CC (Breast) ×6 IMPLANT
KIT BASIN OR (CUSTOM PROCEDURE TRAY) ×6 IMPLANT
KIT FILL ASEPTIC TRANSFER (MISCELLANEOUS) IMPLANT
KIT TURNOVER KIT B (KITS) ×6 IMPLANT
LIGHT WAVEGUIDE WIDE FLAT (MISCELLANEOUS) ×3 IMPLANT
MARKER SKIN DUAL TIP RULER LAB (MISCELLANEOUS) ×6 IMPLANT
NDL 18GX1X1/2 (RX/OR ONLY) (NEEDLE) IMPLANT
NDL HYPO 25GX1X1/2 BEV (NEEDLE) ×3 IMPLANT
NDL SPNL 18GX3.5 QUINCKE PK (NEEDLE) IMPLANT
NDL SPNL 22GX3.5 QUINCKE BK (NEEDLE) ×3 IMPLANT
NEEDLE 18GX1X1/2 (RX/OR ONLY) (NEEDLE) IMPLANT
NEEDLE HYPO 25GX1X1/2 BEV (NEEDLE) ×6 IMPLANT
NEEDLE SPNL 18GX3.5 QUINCKE PK (NEEDLE) IMPLANT
NEEDLE SPNL 22GX3.5 QUINCKE BK (NEEDLE) IMPLANT
NS IRRIG 1000ML POUR BTL (IV SOLUTION) ×9 IMPLANT
PACK GENERAL/GYN (CUSTOM PROCEDURE TRAY) ×6 IMPLANT
PACK SPY-PHI (KITS) IMPLANT
PACK UNIVERSAL I (CUSTOM PROCEDURE TRAY) ×3 IMPLANT
PAD ARMBOARD 7.5X6 YLW CONV (MISCELLANEOUS) ×6 IMPLANT
PENCIL SMOKE EVACUATOR (MISCELLANEOUS) ×3 IMPLANT
PIN SAFETY STERILE (MISCELLANEOUS) ×3 IMPLANT
SET ASEPTIC TRANSFER (MISCELLANEOUS) IMPLANT
SPECIMEN JAR X LARGE (MISCELLANEOUS) ×3 IMPLANT
SPONGE T-LAP 18X18 ~~LOC~~+RFID (SPONGE) IMPLANT
STAPLER VISISTAT 35W (STAPLE) ×6 IMPLANT
STOCKINETTE IMPERVIOUS 9X36 MD (GAUZE/BANDAGES/DRESSINGS) ×3 IMPLANT
STRIP CLOSURE SKIN 1/2X4 (GAUZE/BANDAGES/DRESSINGS) ×3 IMPLANT
SUT ETHILON 2 0 FS 18 (SUTURE) ×3 IMPLANT
SUT MNCRL AB 4-0 PS2 18 (SUTURE) IMPLANT
SUT MON AB 3-0 SH 27 (SUTURE)
SUT MON AB 3-0 SH27 (SUTURE) IMPLANT
SUT MON AB 4-0 PC3 18 (SUTURE) ×3 IMPLANT
SUT MON AB 5-0 PS2 18 (SUTURE) IMPLANT
SUT PDS AB 2-0 CT1 27 (SUTURE) IMPLANT
SUT PDS AB 3-0 SH 27 (SUTURE) IMPLANT
SUT PROLENE 4 0 PS 2 18 (SUTURE) IMPLANT
SUT SILK 2 0 (SUTURE) ×3
SUT SILK 2 0 PERMA HAND 18 BK (SUTURE) ×3 IMPLANT
SUT SILK 2 0 SH (SUTURE) IMPLANT
SUT SILK 2-0 18XBRD TIE 12 (SUTURE) ×3 IMPLANT
SUT SILK 4 0 PS 2 (SUTURE) IMPLANT
SUT VIC AB 3-0 SH 18 (SUTURE) IMPLANT
SUT VIC AB 3-0 SH 8-18 (SUTURE) ×3 IMPLANT
SYR 50ML LL SCALE MARK (SYRINGE) ×3 IMPLANT
SYR CONTROL 10ML LL (SYRINGE) ×3 IMPLANT
TOWEL GREEN STERILE (TOWEL DISPOSABLE) ×6 IMPLANT
TOWEL GREEN STERILE FF (TOWEL DISPOSABLE) ×6 IMPLANT
TRACER MAGTRACE VIAL (MISCELLANEOUS) IMPLANT
TRAY FOLEY MTR SLVR 14FR STAT (SET/KITS/TRAYS/PACK) IMPLANT

## 2022-10-20 NOTE — Anesthesia Procedure Notes (Signed)
Procedure Name: Intubation Date/Time: 10/20/2022 7:55 AM  Performed by: Terrence Dupont, CRNAPre-anesthesia Checklist: Patient identified, Emergency Drugs available, Suction available and Patient being monitored Patient Re-evaluated:Patient Re-evaluated prior to induction Oxygen Delivery Method: Circle system utilized Preoxygenation: Pre-oxygenation with 100% oxygen Induction Type: IV induction Ventilation: Mask ventilation without difficulty Laryngoscope Size: Mac and 3 Grade View: Grade I Tube type: Oral Tube size: 7.0 mm Number of attempts: 1 Airway Equipment and Method: Stylet and Oral airway Placement Confirmation: ETT inserted through vocal cords under direct vision, positive ETCO2 and breath sounds checked- equal and bilateral Secured at: 21 cm Tube secured with: Tape Dental Injury: Teeth and Oropharynx as per pre-operative assessment

## 2022-10-20 NOTE — Interval H&P Note (Signed)
History and Physical Interval Note:  10/20/2022 7:31 AM  Elizabeth Benton  has presented today for surgery, with the diagnosis of RIGHT BREAST CANCER.  The various methods of treatment have been discussed with the patient and family. After consideration of risks, benefits and other options for treatment, the patient has consented to  Procedure(s): RIGHT MASTECTOMY WITH RIGHT SENTINEL LYMPH NODE BIOPSY (Right) LEFT TOTAL MASTECTOMY (Left) BREAST RECONSTRUCTION WITH PLACEMENT OF TISSUE EXPANDER AND FLEX HD (ACELLULAR HYDRATED DERMIS) (Bilateral) as a surgical intervention.  The patient's history has been reviewed, patient examined, no change in status, stable for surgery.  I have reviewed the patient's chart and labs.  Questions were answered to the patient's satisfaction.     Stark Klein

## 2022-10-20 NOTE — Transfer of Care (Signed)
Immediate Anesthesia Transfer of Care Note  Patient: Elizabeth Benton  Procedure(s) Performed: RIGHT MASTECTOMY WITH RIGHT SENTINEL LYMPH NODE BIOPSY (Right: Breast) LEFT TOTAL MASTECTOMY (Left: Breast) BREAST RECONSTRUCTION WITH PLACEMENT OF TISSUE EXPANDER AND FLEX HD (ACELLULAR HYDRATED DERMIS) (Bilateral: Breast)  Patient Location: PACU  Anesthesia Type:GA combined with regional for post-op pain  Level of Consciousness: drowsy  Airway & Oxygen Therapy: Patient Spontanous Breathing and Patient connected to face mask oxygen  Post-op Assessment: Report given to RN and Post -op Vital signs reviewed and stable  Post vital signs: Reviewed and stable  Last Vitals:  Vitals Value Taken Time  BP 131/63 10/20/22 1200  Temp    Pulse 79 10/20/22 1203  Resp 21 10/20/22 1203  SpO2 95 % 10/20/22 1203  Vitals shown include unvalidated device data.  Last Pain:  Vitals:   10/20/22 0707  TempSrc:   PainSc: 3          Complications: No notable events documented.

## 2022-10-20 NOTE — Op Note (Signed)
Right Mastectomy with Sentinel Node Biopsy Procedure, left mastectomy  Indications: This patient presents with history of right breast cancer, desire for symmetry  Pre-operative Diagnosis: right breast cancer, cT2N0M0, upper outer quadrant, receptors +/+/-, Ki 67 40%  Post-operative Diagnosis: same  Surgeon: Stark Klein   Assistant:  Irineo Axon, CST  Anesthesia: General endotracheal anesthesia and pectoral block  ASA Class: 2  Procedure Details  The patient was seen in the Holding Room. The risks, benefits, complications, treatment options, and expected outcomes were discussed with the patient. The possibilities of reaction to medication, pulmonary aspiration, bleeding, infection, the need for additional procedures, failure to diagnose a condition, and creating a complication requiring transfusion or operation were discussed with the patient. The patient concurred with the proposed plan, giving informed consent.  The site of surgery properly noted/marked. The patient was taken to Operating Room # 2, identified as Elizabeth Benton and the procedure verified as Bilateral Mastectomy and right Sentinel Node Biopsy. After induction of anesthesia, the bilateral breast and chest were prepped and draped in standard fashion.  A Time Out was held and the above information confirmed.  The magtrace was injected in the right subareolar location.    The borders of the breast were identified and marked.  The left side was addressed first.  An elliptical incision was drawn out.    The superior incision was made with the #10 blade.  Mastectomy hooks were used to provide elevation of the skin edges, and the cautery was used to create the mastectomy flaps.  The dissection was taken to the fascia of the pectoralis major.  The penetrating vessels were clipped as needed.  The superior flap was taken medially to the lateral sternal border, superiorly to the inferior border of the clavicle.  The inferior flap was  similarly created, inferiorly to the inframammary fold and laterally to the border of the latissimus.  The breast was taken off including the pectoralis fascia and the axillary tail marked.  On the left side, there were several intramammary nodes visible in the axillary tail.  The right side was addressed similarly.  After that the sentinel nodes were addressed on the right.  Using a sentimag probe, axillary sentinel nodes were identified.  Two deep level 2 axillary sentinel nodes were removed and submitted to pathology.  The findings are below.  The lymphovascular channels were clipped with metal clips.        The wound was irrigated.  Hemostasis was achieved with cautery.  Both sides were rechecked multiple times for hemostasis.    The patient was left with Dr. Erin Hearing for reconstruction.     Findings: grossly clear surgical margins, good flap thickness.  No apparent vascular compromise.  Right sentinel nodes:  #1 warm and palpable with cps around 50.  #2 hot, rust colored, cps around 2900  Estimated Blood Loss: <50 mL          Drains: per Dr Erin Hearing.                  Specimens: Left breast, right breast and two right axillary sentinel nodes         Complications:  None; patient tolerated the procedure well.         Disposition: PACU - hemodynamically stable.         Condition: stable

## 2022-10-20 NOTE — Op Note (Signed)
Operative Note   DATE OF OPERATION: 10/20/2022  SURGICAL DEPARTMENT: Plastic Surgery  PREOPERATIVE DIAGNOSES:  right breast cancer  POSTOPERATIVE DIAGNOSES:  same  PROCEDURE:   Bilateral breast tissue expander placement  SURGEON: Melene Plan. Katrine Radich, MD  ASSISTANT: Donnamarie Rossetti, [facility]  ANESTHESIA:  General.   COMPLICATIONS: None.   INDICATIONS FOR PROCEDURE:  The patient, Elizabeth Benton is a 76 y.o. female born on 24-Sep-1946, is here for treatment of right breast cancer MRN: [id number]  CONSENT:  Informed consent was obtained directly from the patient. Risks, benefits and alternatives were fully discussed. Specific risks including but not limited to bleeding, infection, hematoma, seroma, scarring, pain, contracture, asymmetry, wound healing problems, and need for further surgery were all discussed. The patient did have an ample opportunity to have questions answered to satisfaction.   DESCRIPTION OF PROCEDURE:  The patient was taken to the operating room. SCDs were placed and preop antibiotics were given. General anesthesia was administered.  The patient's operative site was prepped and draped in a sterile fashion. A time out was performed and all information was confirmed to be correct.    Bilateral tissue expanders where wrapped anteriorly in Flex HD.  The Flex HD was secured to tabs.  Tabs where sutured to pectoralis fascia and implants where placed in a prepectoral plain.  300 cc of saline was placed on the left and 400 cc of saline was placed on the right.  Expander on the right was REF [facility] SN [id number].  On the left REF number [id number] and SN [id number].  15 Fr. Blake drains where placed bilaterally.    The patient tolerated the procedure well.  There were no complications. The patient was allowed to wake from anesthesia, extubated and taken to the recovery room in satisfactory condition.

## 2022-10-20 NOTE — Interval H&P Note (Signed)
History and Physical Interval Note:  10/20/2022 7:27 AM  Elizabeth Benton  has presented today for surgery, with the diagnosis of RIGHT BREAST CANCER.  The various methods of treatment have been discussed with the patient and family. After consideration of risks, benefits and other options for treatment, the patient has consented to  Procedure(s): RIGHT MASTECTOMY WITH RIGHT SENTINEL LYMPH NODE BIOPSY (Right) LEFT TOTAL MASTECTOMY (Left) BREAST RECONSTRUCTION WITH PLACEMENT OF TISSUE EXPANDER AND FLEX HD (ACELLULAR HYDRATED DERMIS) (Bilateral) as a surgical intervention.  The patient's history has been reviewed, patient examined, no change in status, stable for surgery.  I have reviewed the patient's chart and labs.  Questions were answered to the patient's satisfaction.     Lennice Sites

## 2022-10-20 NOTE — Anesthesia Preprocedure Evaluation (Signed)
Anesthesia Evaluation  Patient identified by MRN, date of birth, ID band Patient awake    Reviewed: Allergy & Precautions, NPO status , Patient's Chart, lab work & pertinent test results  History of Anesthesia Complications Negative for: history of anesthetic complications  Airway Mallampati: III  TM Distance: >3 FB Neck ROM: Full    Dental  (+) Dental Advisory Given, Teeth Intact   Pulmonary former smoker,    breath sounds clear to auscultation       Cardiovascular hypertension, Pt. on medications (-) angina(-) Past MI and (-) CHF  Rhythm:Regular     Neuro/Psych  Headaches, PSYCHIATRIC DISORDERS Depression  Neuromuscular disease    GI/Hepatic negative GI ROS, Neg liver ROS,   Endo/Other  negative endocrine ROS  Renal/GU negative Renal ROS     Musculoskeletal  (+) Arthritis ,   Abdominal   Peds  Hematology negative hematology ROS (+) Lab Results      Component                Value               Date                      WBC                      6.6                 10/13/2022                HGB                      13.7                10/13/2022                HCT                      42.2                10/13/2022                MCV                      86.7                10/13/2022                PLT                      233                 10/13/2022              Anesthesia Other Findings RIGHT BREAST CANCER  Reproductive/Obstetrics                             Anesthesia Physical Anesthesia Plan  ASA: 2  Anesthesia Plan: General and Regional   Post-op Pain Management: Tylenol PO (pre-op)*   Induction:   PONV Risk Score and Plan: 3 and Ondansetron, Dexamethasone, Propofol infusion, TIVA and Midazolam  Airway Management Planned: Oral ETT  Additional Equipment: None  Intra-op Plan:   Post-operative Plan: Extubation in OR  Informed Consent: I have reviewed the patients  History and Physical, chart, labs and discussed the procedure including  the risks, benefits and alternatives for the proposed anesthesia with the patient or authorized representative who has indicated his/her understanding and acceptance.     Dental advisory given  Plan Discussed with: CRNA  Anesthesia Plan Comments:         Anesthesia Quick Evaluation

## 2022-10-21 ENCOUNTER — Encounter (HOSPITAL_COMMUNITY): Payer: Self-pay | Admitting: General Surgery

## 2022-10-21 DIAGNOSIS — Z8249 Family history of ischemic heart disease and other diseases of the circulatory system: Secondary | ICD-10-CM | POA: Diagnosis not present

## 2022-10-21 DIAGNOSIS — Z90710 Acquired absence of both cervix and uterus: Secondary | ICD-10-CM | POA: Diagnosis not present

## 2022-10-21 DIAGNOSIS — Z170 Estrogen receptor positive status [ER+]: Secondary | ICD-10-CM | POA: Diagnosis not present

## 2022-10-21 DIAGNOSIS — F419 Anxiety disorder, unspecified: Secondary | ICD-10-CM | POA: Diagnosis present

## 2022-10-21 DIAGNOSIS — Z91041 Radiographic dye allergy status: Secondary | ICD-10-CM | POA: Diagnosis not present

## 2022-10-21 DIAGNOSIS — I10 Essential (primary) hypertension: Secondary | ICD-10-CM | POA: Diagnosis present

## 2022-10-21 DIAGNOSIS — Z79899 Other long term (current) drug therapy: Secondary | ICD-10-CM | POA: Diagnosis not present

## 2022-10-21 DIAGNOSIS — Z79891 Long term (current) use of opiate analgesic: Secondary | ICD-10-CM | POA: Diagnosis not present

## 2022-10-21 DIAGNOSIS — D62 Acute posthemorrhagic anemia: Secondary | ICD-10-CM | POA: Diagnosis not present

## 2022-10-21 DIAGNOSIS — Z87891 Personal history of nicotine dependence: Secondary | ICD-10-CM | POA: Diagnosis not present

## 2022-10-21 DIAGNOSIS — E559 Vitamin D deficiency, unspecified: Secondary | ICD-10-CM | POA: Diagnosis present

## 2022-10-21 DIAGNOSIS — C50411 Malignant neoplasm of upper-outer quadrant of right female breast: Secondary | ICD-10-CM | POA: Diagnosis present

## 2022-10-21 DIAGNOSIS — Z885 Allergy status to narcotic agent status: Secondary | ICD-10-CM | POA: Diagnosis not present

## 2022-10-21 DIAGNOSIS — R609 Edema, unspecified: Secondary | ICD-10-CM | POA: Diagnosis not present

## 2022-10-21 DIAGNOSIS — Z90721 Acquired absence of ovaries, unilateral: Secondary | ICD-10-CM | POA: Diagnosis not present

## 2022-10-21 DIAGNOSIS — R110 Nausea: Secondary | ICD-10-CM | POA: Diagnosis not present

## 2022-10-21 DIAGNOSIS — A6009 Herpesviral infection of other urogenital tract: Secondary | ICD-10-CM | POA: Diagnosis present

## 2022-10-21 DIAGNOSIS — Z888 Allergy status to other drugs, medicaments and biological substances status: Secondary | ICD-10-CM | POA: Diagnosis not present

## 2022-10-21 DIAGNOSIS — F32A Depression, unspecified: Secondary | ICD-10-CM | POA: Diagnosis present

## 2022-10-21 DIAGNOSIS — Z803 Family history of malignant neoplasm of breast: Secondary | ICD-10-CM | POA: Diagnosis not present

## 2022-10-21 DIAGNOSIS — M8580 Other specified disorders of bone density and structure, unspecified site: Secondary | ICD-10-CM | POA: Diagnosis present

## 2022-10-21 DIAGNOSIS — M7989 Other specified soft tissue disorders: Secondary | ICD-10-CM | POA: Diagnosis not present

## 2022-10-21 DIAGNOSIS — E782 Mixed hyperlipidemia: Secondary | ICD-10-CM | POA: Diagnosis present

## 2022-10-21 DIAGNOSIS — C50919 Malignant neoplasm of unspecified site of unspecified female breast: Secondary | ICD-10-CM | POA: Diagnosis present

## 2022-10-21 DIAGNOSIS — M1990 Unspecified osteoarthritis, unspecified site: Secondary | ICD-10-CM | POA: Diagnosis present

## 2022-10-21 DIAGNOSIS — Z91013 Allergy to seafood: Secondary | ICD-10-CM | POA: Diagnosis not present

## 2022-10-21 DIAGNOSIS — N952 Postmenopausal atrophic vaginitis: Secondary | ICD-10-CM | POA: Diagnosis present

## 2022-10-21 LAB — CBC
HCT: 34.9 % — ABNORMAL LOW (ref 36.0–46.0)
HCT: 33.2 % — ABNORMAL LOW (ref 36.0–46.0)
Hemoglobin: 11.5 g/dL — ABNORMAL LOW (ref 12.0–15.0)
Hemoglobin: 10.9 g/dL — ABNORMAL LOW (ref 12.0–15.0)
MCH: 28.3 pg (ref 26.0–34.0)
MCH: 28.2 pg (ref 26.0–34.0)
MCHC: 33 g/dL (ref 30.0–36.0)
MCHC: 32.8 g/dL (ref 30.0–36.0)
MCV: 85.7 fL (ref 80.0–100.0)
MCV: 86 fL (ref 80.0–100.0)
Platelets: 197 10*3/uL (ref 150–400)
Platelets: 191 10*3/uL (ref 150–400)
RBC: 4.07 MIL/uL (ref 3.87–5.11)
RBC: 3.86 MIL/uL — ABNORMAL LOW (ref 3.87–5.11)
RDW: 13.9 % (ref 11.5–15.5)
RDW: 14.2 % (ref 11.5–15.5)
WBC: 11.9 10*3/uL — ABNORMAL HIGH (ref 4.0–10.5)
WBC: 12.5 10*3/uL — ABNORMAL HIGH (ref 4.0–10.5)
nRBC: 0 % (ref 0.0–0.2)
nRBC: 0 % (ref 0.0–0.2)

## 2022-10-21 LAB — BASIC METABOLIC PANEL
Anion gap: 7 (ref 5–15)
BUN: 6 mg/dL — ABNORMAL LOW (ref 8–23)
CO2: 22 mmol/L (ref 22–32)
Calcium: 8.7 mg/dL — ABNORMAL LOW (ref 8.9–10.3)
Chloride: 105 mmol/L (ref 98–111)
Creatinine, Ser: 0.53 mg/dL (ref 0.44–1.00)
GFR, Estimated: >60 mL/min (ref >60)
Glucose, Bld: 147 mg/dL — ABNORMAL HIGH (ref 70–99)
Potassium: 4.6 mmol/L (ref 3.5–5.1)
Sodium: 134 mmol/L — ABNORMAL LOW (ref 135–145)

## 2022-10-21 MED ORDER — GABAPENTIN 100 MG PO CAPS
100.0000 mg | ORAL_CAPSULE | Freq: Two times a day (BID) | ORAL | Status: DC
  Administered 2022-10-21 – 2022-10-22 (×3): 100 mg via ORAL
  Filled 2022-10-21 (×3): qty 1

## 2022-10-21 MED ORDER — ACETAMINOPHEN 325 MG PO TABS
325.0000 mg | ORAL_TABLET | Freq: Four times a day (QID) | ORAL | Status: DC | PRN
  Administered 2022-10-22 (×2): 650 mg via ORAL
  Filled 2022-10-21 (×2): qty 2

## 2022-10-21 MED ORDER — HYDROCODONE-ACETAMINOPHEN 5-325 MG PO TABS
1.0000 | ORAL_TABLET | ORAL | Status: DC | PRN
  Administered 2022-10-21 (×2): 2 via ORAL
  Administered 2022-10-21: 1 via ORAL
  Administered 2022-10-22: 2 via ORAL
  Filled 2022-10-21: qty 2
  Filled 2022-10-21: qty 1
  Filled 2022-10-21 (×2): qty 2

## 2022-10-21 MED ORDER — TRAMADOL HCL 50 MG PO TABS: 50.0000 mg | ORAL_TABLET | Freq: Four times a day (QID) | ORAL | Status: DC | PRN

## 2022-10-21 MED ORDER — LIDOCAINE-EPINEPHRINE (PF) 1.5 %-1:200000 IJ SOLN
INTRAMUSCULAR | Status: DC | PRN
  Administered 2022-10-20 (×2): 5 mL via PERINEURAL

## 2022-10-21 MED ORDER — METHOCARBAMOL 500 MG PO TABS
500.0000 mg | ORAL_TABLET | Freq: Four times a day (QID) | ORAL | Status: DC
  Administered 2022-10-21 – 2022-10-22 (×6): 500 mg via ORAL
  Filled 2022-10-21 (×5): qty 1

## 2022-10-21 MED ORDER — BUPIVACAINE-EPINEPHRINE (PF) 0.25% -1:200000 IJ SOLN
INTRAMUSCULAR | Status: DC | PRN
  Administered 2022-10-20 (×2): 10 mL via PERINEURAL

## 2022-10-21 MED ORDER — BUPIVACAINE-EPINEPHRINE (PF) 0.5% -1:200000 IJ SOLN
INTRAMUSCULAR | Status: DC | PRN
  Administered 2022-10-20 (×2): 10 mL

## 2022-10-21 MED ORDER — OXYCODONE HCL 5 MG PO TABS: 5.0000 mg | ORAL_TABLET | ORAL | Status: DC | PRN

## 2022-10-21 NOTE — Progress Notes (Addendum)
1 Day Post-Op  Subjective: Nurse reported patient had approximately 200 cc of drainage from her left JP drain during her shift today. She reports that the patient has been ambulating and has been eating.   Upon entering the room, the patient is lying in bed in no acute distress. She states that she feels better from this morning. She reports she has been able to get up and go to the bathroom. She denies any chest pain or SOB. She reports she still has some right sided pain. She also reports she has been experiencing some nausea but has not vomited. Pt states she has been eating and drinking without issue.   Vitals were obtained in the room and were stable.   Objective: Vital signs in last 24 hours: Temp:  [97.6 F (36.4 C)-99.1 F (37.3 C)] 97.7 F (36.5 C) (10/25 1618) Pulse Rate:  [84-95] 95 (10/25 1618) Resp:  [16-18] 18 (10/25 1618) BP: (125-145)/(63-83) 125/63 (10/25 1618) SpO2:  [93 %-98 %] 94 % (10/25 1618)    Intake/Output from previous day: 10/24 0701 - 10/25 0700 In: 2490.7 [P.O.:240; I.V.:2250.7] Out: 960 [Urine:300; Drains:510; Blood:150] Intake/Output this shift: Total I/O In: -  Out: 275 [Drains:275]  General appearance: alert, cooperative, and no distress Resp: unlabored breathing, no respiratory distress Breasts: Exam is similar to this morning. Expanders are place bilaterally. There are no significant fluid collections palpated on exam. There are no obvious signs of hematoma. There is some mild ecchymosis bilaterally, similar to this morning's exam. Dressings to incisions are clean, dry and intact. JP drains are in place and functioning bilaterally. There is approximately 10 cc of serosanguinous drainage in the R drain and 25 cc of dark serosanguinous drainage in the left drain. There is no bright red blood noted in either drain.   Patient's breast binder was removed and replaced with ACE wrap.   Lab Results:     Latest Ref Rng & Units 10/21/2022    5:19 AM  10/13/2022    3:50 PM 08/05/2022   12:28 PM  CBC  WBC 4.0 - 10.5 K/uL 11.9  6.6  7.9   Hemoglobin 12.0 - 15.0 g/dL 11.5  13.7  14.2   Hematocrit 36.0 - 46.0 % 34.9  42.2  42.7   Platelets 150 - 400 K/uL 197  233  230     BMET Recent Labs    10/21/22 0519  NA 134*  K 4.6  CL 105  CO2 22  GLUCOSE 147*  BUN 6*  CREATININE 0.53  CALCIUM 8.7*   PT/INR No results for input(s): "LABPROT", "INR" in the last 72 hours. ABG No results for input(s): "PHART", "HCO3" in the last 72 hours.  Invalid input(s): "PCO2", "PO2"  Studies/Results: No results found.  Anti-infectives: Anti-infectives (From admission, onward)    Start     Dose/Rate Route Frequency Ordered Stop   10/20/22 2200  sulfamethoxazole-trimethoprim (BACTRIM DS) 800-160 MG per tablet 1 tablet        1 tablet Oral 2 times daily 10/20/22 2107     10/20/22 0903  ceFAZolin 1 g / gentamicin 80 mg in NS 500 mL surgical irrigation  Status:  Discontinued          As needed 10/20/22 0906 10/20/22 1156   10/20/22 0730  ceFAZolin 1 g / gentamicin 80 mg in NS 500 mL surgical irrigation  Status:  Discontinued         Irrigation  Once 10/20/22 0732 10/20/22 1559   10/20/22 0615  ceFAZolin (ANCEF) IVPB 2g/100 mL premix        2 g 200 mL/hr over 30 Minutes Intravenous On call to O.R. 10/20/22 0602 10/20/22 0841       Assessment/Plan: s/p Procedure(s): RIGHT MASTECTOMY WITH RIGHT SENTINEL LYMPH NODE BIOPSY LEFT TOTAL MASTECTOMY BREAST RECONSTRUCTION WITH PLACEMENT OF TISSUE EXPANDER AND FLEX HD (ACELLULAR HYDRATED DERMIS)  Plan: -CBC ordered, f/u results -Continue compression at all times -Monitor drain output  -Monitor vitals -Continue pain control: gabapentin, robaxin, hydrocodone -Continue zofran and compazine for nausea  Objective findings and plan discussed with Dr. Erin Hearing   LOS: 0 days    [name], PA-C 10/21/2022

## 2022-10-21 NOTE — Anesthesia Postprocedure Evaluation (Signed)
Anesthesia Post Note  Patient: DANAE OLAND  Procedure(s) Performed: RIGHT MASTECTOMY WITH RIGHT SENTINEL LYMPH NODE BIOPSY (Right: Breast) LEFT TOTAL MASTECTOMY (Left: Breast) BREAST RECONSTRUCTION WITH PLACEMENT OF TISSUE EXPANDER AND FLEX HD (ACELLULAR HYDRATED DERMIS) (Bilateral: Breast)     Patient location during evaluation: PACU Anesthesia Type: Regional and General Level of consciousness: awake and alert Pain management: pain level controlled Vital Signs Assessment: post-procedure vital signs reviewed and stable Respiratory status: spontaneous breathing, nonlabored ventilation and respiratory function stable Cardiovascular status: blood pressure returned to baseline and stable Postop Assessment: no apparent nausea or vomiting Anesthetic complications: no   No notable events documented.  Last Vitals:  Vitals:   10/21/22 0530 10/21/22 0845  BP: (!) 141/73 138/72  Pulse: 95 93  Resp: 17 17  Temp: 37.3 C 37.1 C  SpO2: 96% 98%    Last Pain:  Vitals:   10/21/22 0845  TempSrc: Oral  PainSc:                  Rosa Wyly

## 2022-10-21 NOTE — Discharge Instructions (Signed)
[facility] [phone]  MASTECTOMY: POST OP INSTRUCTIONS  Always review your discharge instruction sheet given to you by the facility where your surgery was performed. IF YOU HAVE DISABILITY OR FAMILY LEAVE FORMS, YOU MUST BRING THEM TO THE OFFICE FOR PROCESSING.   DO NOT GIVE THEM TO YOUR DOCTOR. A prescription for pain medication may be given to you upon discharge.  Take your pain medication as prescribed, if needed.  If narcotic pain medicine is not needed, then you may take acetaminophen (Tylenol) or ibuprofen (Advil) as needed. Take your usually prescribed medications unless otherwise directed. If you need a refill on your pain medication, please contact your pharmacy.  They will contact our office to request authorization.  Prescriptions will not be filled after 5pm or on week-ends. You should follow a light diet the first few days after arrival home, such as soup and crackers, etc.  Resume your normal diet the day after surgery. Most patients will experience some swelling and bruising on the chest and underarm.  Ice packs will help.  Swelling and bruising can take several days to resolve.  It is common to experience some constipation if taking pain medication after surgery.  Increasing fluid intake and taking a stool softener (such as Colace) will usually help or prevent this problem from occurring.  A mild laxative (Milk of Magnesia or Miralax) should be taken according to package instructions if there are no bowel movements after 48 hours. Unless discharge instructions indicate otherwise, leave your bandage dry and in place until your next appointment in 3-5 days.  You may take a limited sponge bath.  No tube baths or showers until the drains are removed.  You may have steri-strips (small skin tapes) in place directly over the incision.  These strips should be left on the skin for 7-10 days.  If your surgeon used skin glue on the incision, you may shower in 24 hours.   The glue will flake off over the next 2-3 weeks.  Any sutures or staples will be removed at the office during your follow-up visit. DRAINS:  If you have drains in place, it is important to keep a list of the amount of drainage produced each day in your drains.  Before leaving the hospital, you should be instructed on drain care.  Call our office if you have any questions about your drains. ACTIVITIES:  You may resume regular (light) daily activities beginning the next day--such as daily self-care, walking, climbing stairs--gradually increasing activities as tolerated.  You may have sexual intercourse when it is comfortable.  Refrain from any heavy lifting or straining until approved by your doctor. You may drive when you are no longer taking prescription pain medication, you can comfortably wear a seatbelt, and you can safely maneuver your car and apply brakes. RETURN TO WORK:  __________________________________________________________ You should see your doctor in the office for a follow-up appointment approximately 3-5 days after your surgery.  Your doctor's nurse will typically make your follow-up appointment when she calls you with your pathology report.  Expect your pathology report 2-3 business days after your surgery.  You may call to check if you do not hear from us after three days.   OTHER INSTRUCTIONS: ______________________________________________________________________________________________ ____________________________________________________________________________________________ WHEN TO CALL YOUR DOCTOR: Fever over 101.0 Nausea and/or vomiting Extreme swelling or bruising Continued bleeding from incision. Increased pain, redness, or drainage from the incision. The clinic staff is available to answer your questions during regular business hours.  Please don't hesitate   to call and ask to speak to one of the nurses for clinical concerns.  If you have a medical emergency, go to the  nearest emergency room or call 911.  A surgeon from [facility] is always on call at the hospital. [address], [address], [place], [place]  [address] ? [address] [address], , [place]   [address] [phone] ? [phone] ? FAX [phone] Web site: [email or web address]  

## 2022-10-21 NOTE — Anesthesia Procedure Notes (Signed)
Anesthesia Regional Block: Pectoralis block   Pre-Anesthetic Checklist: , timeout performed,  Correct Patient, Correct Site, Correct Laterality,  Correct Procedure, Correct Position, site marked,  Risks and benefits discussed,  Surgical consent,  Pre-op evaluation,  At surgeon's request and post-op pain management  Laterality: Left  Prep: chloraprep       Needles:  Injection technique: Single-shot      Needle Length: 9cm  Needle Gauge: 22     Additional Needles: Arrow StimuQuik ECHO Echogenic Stimulating PNB Needle  Procedures:,,,, ultrasound used (permanent image in chart),,    Narrative:  Start time: 10/21/2022 7:49 AM End time: 10/21/2022 7:54 AM Injection made incrementally with aspirations every 5 mL.  Performed by: Personally  Anesthesiologist: Oleta Mouse, MD

## 2022-10-21 NOTE — Progress Notes (Signed)
1 Day Post-Op  Subjective: Patient is postop day 1 from R mastectomy with sentinel node biopsy and left mastectomy with Dr. Norman Clay followed by bilateral breast tissue expander placement with Dr. Erin Hearing.   Upon entering the room, patient is sleeping in bed in no acute distress. Patient's daughter is at bedside. Patient reports she is having some pain to the inferior aspect of the right breast. She reports she has been taking tylenol for this but has not taken any pain medication other than morphine she received shortly after she arrived to the floor from surgery. Patient denies any fevers or chills. She reports she has been nauseous but has not thrown up. She states she ate a little bit last night but has not eaten since. She states she drank a little bit a water. Patient reports she has not gotten out of bed yet since surgery.   Per chart review, patient's Hgb is 11.5. WBC is 11.9. She is afebrile. Drain output from the right drain as been 220 cc and 290 from the left drain since yesterday.   Objective: Vital signs in last 24 hours: Temp:  [97.5 F (36.4 C)-99.1 F (37.3 C)] 98.7 F (37.1 C) (10/25 0845) Pulse Rate:  [71-95] 93 (10/25 0845) Resp:  [10-20] 17 (10/25 0845) BP: (102-145)/(63-83) 138/72 (10/25 0845) SpO2:  [90 %-98 %] 98 % (10/25 0845)    Intake/Output from previous day: 10/24 0701 - 10/25 0700 In: 2490.7 [P.O.:240; I.V.:2250.7] Out: 960 [Urine:300; Drains:510; Blood:150] Intake/Output this shift: No intake/output data recorded.  General appearance: alert, cooperative, and no distress Resp: unlabored breathing, no respiratory distress Breasts: Expanders are in place bilaterally. The dressings over the incisions are clean, dry and intact. There is a little bit of ecchymosis noted to both breasts. There is no overlying erythema and no fluid collections palpated on exam. Drains are in place and functioning bilaterally. There is approximately 25 cc of dark serosanguinous  drainage in the right drain and approximately 40 cc of dark serosanguinous drainage in the left drain.   Lab Results:     Latest Ref Rng & Units 10/21/2022    5:19 AM 10/13/2022    3:50 PM 08/05/2022   12:28 PM  CBC  WBC 4.0 - 10.5 K/uL 11.9  6.6  7.9   Hemoglobin 12.0 - 15.0 g/dL 11.5  13.7  14.2   Hematocrit 36.0 - 46.0 % 34.9  42.2  42.7   Platelets 150 - 400 K/uL 197  233  230     BMET Recent Labs    10/21/22 0519  NA 134*  K 4.6  CL 105  CO2 22  GLUCOSE 147*  BUN 6*  CREATININE 0.53  CALCIUM 8.7*   PT/INR No results for input(s): "LABPROT", "INR" in the last 72 hours. ABG No results for input(s): "PHART", "HCO3" in the last 72 hours.  Invalid input(s): "PCO2", "PO2"  Studies/Results: No results found.  Anti-infectives: Anti-infectives (From admission, onward)    Start     Dose/Rate Route Frequency Ordered Stop   10/20/22 2200  sulfamethoxazole-trimethoprim (BACTRIM DS) 800-160 MG per tablet 1 tablet        1 tablet Oral 2 times daily 10/20/22 2107     10/20/22 0903  ceFAZolin 1 g / gentamicin 80 mg in NS 500 mL surgical irrigation  Status:  Discontinued          As needed 10/20/22 0906 10/20/22 1156   10/20/22 0730  ceFAZolin 1 g / gentamicin 80 mg in NS  500 mL surgical irrigation  Status:  Discontinued         Irrigation  Once 10/20/22 0732 10/20/22 1559   10/20/22 0615  ceFAZolin (ANCEF) IVPB 2g/100 mL premix        2 g 200 mL/hr over 30 Minutes Intravenous On call to O.R. 10/20/22 0602 10/20/22 0841       Assessment/Plan: s/p Procedure(s): RIGHT MASTECTOMY WITH RIGHT SENTINEL LYMPH NODE BIOPSY LEFT TOTAL MASTECTOMY BREAST RECONSTRUCTION WITH PLACEMENT OF TISSUE EXPANDER AND FLEX HD (ACELLULAR HYDRATED DERMIS)  Plan: -I encouraged the patient to ambulate and encouraged her to eat and drink  -Tramadol for pain if not controlled with tylenol  -zofran for nausea  -monitor drain output  -agree with general surgery to keep patient another night.    Objective findings and plan discussed with Dr. Erin Hearing   LOS: 0 days    [name], PA-C 10/21/2022

## 2022-10-21 NOTE — Anesthesia Procedure Notes (Signed)
Anesthesia Regional Block: Pectoralis block   Pre-Anesthetic Checklist: , timeout performed,  Correct Patient, Correct Site, Correct Laterality,  Correct Procedure, Correct Position, site marked,  Risks and benefits discussed,  Surgical consent,  Pre-op evaluation,  At surgeon's request and post-op pain management  Laterality: Right  Prep: chloraprep       Needles:  Injection technique: Single-shot      Needle Length: 9cm  Needle Gauge: 22     Additional Needles: Arrow StimuQuik ECHO Echogenic Stimulating PNB Needle  Procedures:,,,, ultrasound used (permanent image in chart),,    Narrative:  Start time: 10/21/2022 7:55 AM End time: 10/21/2022 7:59 AM Injection made incrementally with aspirations every 5 mL.  Performed by: Personally  Anesthesiologist: Oleta Mouse, MD

## 2022-10-21 NOTE — Progress Notes (Signed)
1 Day Post-Op   Subjective/Chief Complaint: Pt has had quite a bit of pain/nausea overnight.  She hasn't been OOB yet    Objective: Vital signs in last 24 hours: Temp:  [97.5 F (36.4 C)-99.1 F (37.3 C)] 98.7 F (37.1 C) (10/25 0845) Pulse Rate:  [71-95] 93 (10/25 0845) Resp:  [10-20] 17 (10/25 0845) BP: (102-145)/(63-83) 138/72 (10/25 0845) SpO2:  [90 %-98 %] 98 % (10/25 0845)    Intake/Output from previous day: 10/24 0701 - 10/25 0700 In: 2490.7 [P.O.:240; I.V.:2250.7] Out: 960 [Urine:300; Drains:510; Blood:150] Intake/Output this shift: No intake/output data recorded.  General appearance: alert, cooperative, and moderate distress Resp: breathing evenly, but looks uncomfortable Chest wall: anticipated bilateral chest wall tenderness.  No hematoma visible.  Drains serosang.    Lab Results:  Recent Labs    10/21/22 0519  WBC 11.9*  HGB 11.5*  HCT 34.9*  PLT 197   BMET Recent Labs    10/21/22 0519  NA 134*  K 4.6  CL 105  CO2 22  GLUCOSE 147*  BUN 6*  CREATININE 0.53  CALCIUM 8.7*   PT/INR No results for input(s): "LABPROT", "INR" in the last 72 hours. ABG No results for input(s): "PHART", "HCO3" in the last 72 hours.  Invalid input(s): "PCO2", "PO2"  Studies/Results: No results found.  Anti-infectives: Anti-infectives (From admission, onward)    Start     Dose/Rate Route Frequency Ordered Stop   10/20/22 2200  sulfamethoxazole-trimethoprim (BACTRIM DS) 800-160 MG per tablet 1 tablet        1 tablet Oral 2 times daily 10/20/22 2107     10/20/22 6387  ceFAZolin 1 g / gentamicin 80 mg in NS 500 mL surgical irrigation  Status:  Discontinued          As needed 10/20/22 0906 10/20/22 1156   10/20/22 0730  ceFAZolin 1 g / gentamicin 80 mg in NS 500 mL surgical irrigation  Status:  Discontinued         Irrigation  Once 10/20/22 0732 10/20/22 1559   10/20/22 0615  ceFAZolin (ANCEF) IVPB 2g/100 mL premix        2 g 200 mL/hr over 30 Minutes Intravenous  On call to O.R. 10/20/22 0602 10/20/22 0841       Assessment/Plan: s/p Procedure(s): RIGHT MASTECTOMY WITH RIGHT SENTINEL LYMPH NODE BIOPSY (Right) LEFT TOTAL MASTECTOMY (Left) BREAST RECONSTRUCTION WITH PLACEMENT OF TISSUE EXPANDER AND FLEX HD (ACELLULAR HYDRATED DERMIS) (Bilateral) Keep again tonight due to pain, nausea, and lack of mobilization. Adjusted pain regimen.  Added gabapentin, scheduled robaxin, switched from oxy to hydrocodone.   Also has PRN tramadol and ibuprofen.   IS/pulmonary toilet [employer].     LOS: 0 days    Stark Klein 10/21/2022

## 2022-10-21 NOTE — Progress Notes (Signed)
Mobility Specialist - Progress Note   10/21/22 1200  Mobility  Activity Ambulated with assistance in hallway  Level of Assistance Minimal assist, patient does 75% or more  Assistive Device Front wheel walker  Distance Ambulated (ft) 100 ft  Activity Response Tolerated well  $Mobility charge 1 Mobility    Pt received in bed agreeable to mobility. Left in bed w/ call bell and all needs met.   Paulla Dolly Mobility Specialist

## 2022-10-22 ENCOUNTER — Inpatient Hospital Stay (HOSPITAL_COMMUNITY): Payer: 59

## 2022-10-22 DIAGNOSIS — R609 Edema, unspecified: Secondary | ICD-10-CM

## 2022-10-22 DIAGNOSIS — M7989 Other specified soft tissue disorders: Secondary | ICD-10-CM | POA: Diagnosis not present

## 2022-10-22 LAB — CBC
HCT: 32.6 % — ABNORMAL LOW (ref 36.0–46.0)
Hemoglobin: 10.2 g/dL — ABNORMAL LOW (ref 12.0–15.0)
MCH: 27.3 pg (ref 26.0–34.0)
MCHC: 31.3 g/dL (ref 30.0–36.0)
MCV: 87.2 fL (ref 80.0–100.0)
Platelets: 186 10*3/uL (ref 150–400)
RBC: 3.74 MIL/uL — ABNORMAL LOW (ref 3.87–5.11)
RDW: 14.5 % (ref 11.5–15.5)
WBC: 9.7 10*3/uL (ref 4.0–10.5)
nRBC: 0 % (ref 0.0–0.2)

## 2022-10-22 MED ORDER — HYDROCODONE-ACETAMINOPHEN 5-325 MG PO TABS: 1.0000 | ORAL_TABLET | ORAL | 0 refills | Status: DC | PRN

## 2022-10-22 MED ORDER — ONDANSETRON HCL 8 MG PO TABS: 8.0000 mg | ORAL_TABLET | Freq: Three times a day (TID) | ORAL | 1 refills | Status: DC | PRN

## 2022-10-22 MED ORDER — SENNA 8.6 MG PO TABS: 1.0000 | ORAL_TABLET | Freq: Two times a day (BID) | ORAL | 0 refills | Status: DC

## 2022-10-22 MED ORDER — METHOCARBAMOL 500 MG PO TABS: 500.0000 mg | ORAL_TABLET | Freq: Four times a day (QID) | ORAL | 1 refills | Status: DC | PRN

## 2022-10-22 MED ORDER — GABAPENTIN 100 MG PO CAPS: 100.0000 mg | ORAL_CAPSULE | Freq: Two times a day (BID) | ORAL | 0 refills | Status: DC

## 2022-10-22 NOTE — Discharge Summary (Signed)
Physician Discharge Summary  Patient ID: Elizabeth Benton MRN: [id number] DOB/AGE: [birth year] 76 y.o.  Admit date: 10/20/2022 Discharge date: 10/22/2022  Admission Diagnoses: Right breast cancer  Discharge Diagnoses:  Principal Problem:   Breast cancer [facility]) Active Problems:   S/P breast reconstruction   Breast cancer of upper-outer quadrant of right female breast [facility])   Discharged Condition: good  Hospital Course: Patient is postop day 2 from R mastectomy with sentinel node biopsy and left mastectomy with Dr. Norman Clay followed by bilateral breast tissue expander placement with Dr. Erin Hearing.  Patient was admitted for observation and pain control.   Postop day 1 patient complained of nausea and pain to the right breast. She had not gotten out of bed and was having high output from her drains. Patient was kept for another night for pain control, nausea and to work on ambulating.   Today patient reports she is doing much better. She states that she has ambulated several times without issue. Patient reports she is still a little nauseous but the medications are helping. She reports she has been eating and drinking without issue. Patient reports pain is much improved. Patient states she would like to go home today.  I discussed the results of the LLE venous US with the patient and the patient's daughter.    Consults: None  Significant Diagnostic Studies: LLE venous US - negative for DVT  Treatments: surgery: R mastectomy with sentinel node biopsy and left mastectomy with Dr. Norman Clay followed by bilateral breast tissue expander placement with Dr. Erin Hearing   Discharge Exam: Blood pressure 130/60, pulse (!) 101, temperature 98.3 F (36.8 C), temperature source Oral, resp. rate 17, height 5' (1.524 m), weight 63.5 kg, SpO2 94 %. General appearance: alert, cooperative, and no distress Resp: unlabored breathing, no respiratory distress Breasts: ACE wrap in place is clean, dry and intact.  Drains are in place and are functioning. Serosanguinous drainage in bulbs bilaterally.   Disposition: Discharge disposition: 01-Home or Self Care      All of the patient's and the patient's daughter's questions were answered   Discharge plan discussed with Dr. Barry Dienes and Dr. Erin Hearing  Discharge Instructions     Call MD for:  difficulty breathing, headache or visual disturbances   Complete by: As directed    Call MD for:  hives   Complete by: As directed    Call MD for:  persistant nausea and vomiting   Complete by: As directed    Call MD for:  redness, tenderness, or signs of infection (pain, swelling, redness, odor or green/yellow discharge around incision site)   Complete by: As directed    Call MD for:  severe uncontrolled pain   Complete by: As directed    Call MD for:  temperature >100.4   Complete by: As directed    Diet - low sodium heart healthy   Complete by: As directed    Increase activity slowly   Complete by: As directed       Allergies as of 10/22/2022       Reactions   Contrast Media [iodinated Contrast Media] Anaphylaxis   Demerol Other (See Comments)   Patient stated she will pass out, unaware of surroundings. Last time she received it (for surgery) her stomach had to be pumped.   Iodine Anaphylaxis        Medication List     TAKE these medications    acetaminophen 650 MG CR tablet Commonly known as: TYLENOL Take 650 mg by mouth  every 8 (eight) hours as needed for pain.   ALPRAZolam 0.25 MG tablet Commonly known as: XANAX Take 0.25-0.5 mg by mouth every 8 (eight) hours as needed for anxiety.   amLODipine 5 MG tablet Commonly known as: NORVASC Take 1.5 tablets (7.5 mg total) by mouth daily. What changed: how much to take   betamethasone dipropionate 0.05 % cream Apply 1 Application topically 2 (two) times daily as needed for rash.   gabapentin 100 MG capsule Commonly known as: NEURONTIN Take 1 capsule (100 mg total) by mouth 2 (two)  times daily.   HYDROcodone-acetaminophen 5-325 MG tablet Commonly known as: NORCO/VICODIN Take 1-2 tablets by mouth every 4 (four) hours as needed for moderate pain.   LUBRICATING EYE DROPS OP Place 1 drop into both eyes daily as needed (dry eyes).   methocarbamol 500 MG tablet Commonly known as: ROBAXIN Take 1 tablet (500 mg total) by mouth every 6 (six) hours as needed for muscle spasms.   ondansetron 8 MG tablet Commonly known as: Zofran Take 1 tablet (8 mg total) by mouth every 8 (eight) hours as needed for up to 40 doses for nausea or vomiting. What changed:  medication strength how much to take   rosuvastatin 20 MG tablet Commonly known as: CRESTOR TAKE 1 TABLET BY MOUTH EVERY DAY   senna 8.6 MG Tabs tablet Commonly known as: SENOKOT Take 1 tablet (8.6 mg total) by mouth 2 (two) times daily.   sulfamethoxazole-trimethoprim 800-160 MG tablet Commonly known as: BACTRIM DS Take 1 tablet by mouth 2 (two) times daily for 14 days.   traMADol 50 MG tablet Commonly known as: ULTRAM Take 1 tablet (50 mg total) by mouth every 8 (eight) hours as needed for up to 20 doses.        Follow-up Information     Stark Klein, MD Follow up in 2 week(s).   Specialty: General Surgery Contact information: [address] [address] [phone]         Lennice Sites, MD Follow up on 10/28/2022.   Specialty: Plastic Surgery Why: 1 pm Contact information: [address] [place] [address] [phone]                 [facility] [place], [place] [address] [phone]  Signed: [name] 10/22/2022, 4:55 PM

## 2022-10-22 NOTE — Progress Notes (Signed)
Re-educate pt and family about drain care. Husband and daughter st bedside.

## 2022-10-22 NOTE — Progress Notes (Signed)
Mobility Specialist - Progress Note   10/22/22 1100  Mobility  Activity Ambulated with assistance in hallway  Level of Assistance Standby assist, set-up cues, supervision of patient - no hands on  Assistive Device Front wheel walker  Distance Ambulated (ft) 200 ft  Activity Response Tolerated well  $Mobility charge 1 Mobility    Pt received in bed agreeable to mobility. Left sitting EOB w/ call bell in reach and all needs met.   Paulla Dolly Mobility Specialist

## 2022-10-22 NOTE — Progress Notes (Signed)
Lower extremity venous has been completed.   Preliminary results in CV Proc.   Elizabeth Benton 10/22/2022 11:42 AM

## 2022-10-22 NOTE — Progress Notes (Signed)
2 Days Post-Op   Subjective/Chief Complaint: Pain much better after scheduled methocarbamol.  Still a bit nauseated with pain meds and needing zofran.  Has been OOB.     Objective: Vital signs in last 24 hours: Temp:  [97.7 F (36.5 C)-98.3 F (36.8 C)] 97.9 F (36.6 C) (10/26 0532) Pulse Rate:  [94-97] 94 (10/26 0532) Resp:  [17-18] 17 (10/26 0532) BP: (116-138)/(61-73) 116/61 (10/26 0532) SpO2:  [94 %-96 %] 95 % (10/26 0532)    Intake/Output from previous day: 10/25 0701 - 10/26 0700 In: 120 [P.O.:120] Out: 575 [Drains:575] Intake/Output this shift: Total I/O In: 240 [P.O.:240] Out: -   General appearance: alert, cooperative, no distress Resp: breathing comfortably Chest wall: anticipated bilateral chest wall tenderness.  No hematoma visible.  Drains serosang. Still high amount, but much drainage much thinner in quality.   Lab Results:  Recent Labs    10/21/22 1631 10/22/22 0326  WBC 12.5* 9.7  HGB 10.9* 10.2*  HCT 33.2* 32.6*  PLT 191 186   BMET Recent Labs    10/21/22 0519  NA 134*  K 4.6  CL 105  CO2 22  GLUCOSE 147*  BUN 6*  CREATININE 0.53  CALCIUM 8.7*   PT/INR No results for input(s): "LABPROT", "INR" in the last 72 hours. ABG No results for input(s): "PHART", "HCO3" in the last 72 hours.  Invalid input(s): "PCO2", "PO2"  Studies/Results: No results found.  Anti-infectives: Anti-infectives (From admission, onward)    Start     Dose/Rate Route Frequency Ordered Stop   10/20/22 2200  sulfamethoxazole-trimethoprim (BACTRIM DS) 800-160 MG per tablet 1 tablet        1 tablet Oral 2 times daily 10/20/22 2107     10/20/22 8921  ceFAZolin 1 g / gentamicin 80 mg in NS 500 mL surgical irrigation  Status:  Discontinued          As needed 10/20/22 0906 10/20/22 1156   10/20/22 0730  ceFAZolin 1 g / gentamicin 80 mg in NS 500 mL surgical irrigation  Status:  Discontinued         Irrigation  Once 10/20/22 0732 10/20/22 1559   10/20/22 0615   ceFAZolin (ANCEF) IVPB 2g/100 mL premix        2 g 200 mL/hr over 30 Minutes Intravenous On call to O.R. 10/20/22 0602 10/20/22 0841       Assessment/Plan: s/p Procedure(s): RIGHT MASTECTOMY WITH RIGHT SENTINEL LYMPH NODE BIOPSY (Right) LEFT TOTAL MASTECTOMY (Left) BREAST RECONSTRUCTION WITH PLACEMENT OF TISSUE EXPANDER AND FLEX HD (ACELLULAR HYDRATED DERMIS) (Bilateral) IS Mild ABL anemia Venous duplex pending for RLE edema. Plan home later today if duplex negative and doing OK. Plastics PA to check.  Discussed with her.     LOS: 1 day    Stark Klein 10/22/2022

## 2022-10-22 NOTE — Progress Notes (Signed)
2 Days Post-Op  Subjective: Patient is postop day 2 from R mastectomy with sentinel node biopsy and left mastectomy with Dr. Norman Clay followed by bilateral breast tissue expander placement with Dr. Erin Hearing.   Upon entering the room, the patient is sleeping in bed in no acute distress. Patient's daughter is at bedside. Patient reports that she had a much better night last night than the previous night. She denies any acute events overnight. She states the pain to her right breast is improved. Patient reports the medications are helping. She states that she still feels nauseous at times but states the medications help. She reports she has been eating and drinking without issue.  Patient reports she has been ambulating to the bathroom.  She denies any fevers, chills, chest pain or shortness of breath.  Patient's daughter mentions that the patient's lower extremities both have been swollen and and they have been elevating them throughout the night.  Patient's chart was reviewed.  Her hemoglobin today is 10.2.  Her WBC is 9.7.  She is afebrile.  Patient's chart shows that she had 120 cc of output from the right drain from overnight and 130 cc of output from the left drain overnight.  Objective: Vital signs in last 24 hours: Temp:  [97.7 F (36.5 C)-98.7 F (37.1 C)] 97.9 F (36.6 C) (10/26 0532) Pulse Rate:  [93-97] 94 (10/26 0532) Resp:  [17-18] 17 (10/26 0532) BP: (116-138)/(61-73) 116/61 (10/26 0532) SpO2:  [94 %-98 %] 95 % (10/26 0532)    Intake/Output from previous day: 10/25 0701 - 10/26 0700 In: 120 [P.O.:120] Out: 525 [Drains:525] Intake/Output this shift: No intake/output data recorded.  General appearance: alert, cooperative, and no distress Resp: Unlabored breathing, no respiratory distress Breasts: Expanders are in place bilaterally.  There is some mild ecchymosis overlying both breasts, similar to yesterday's exam.  There is no overlying erythema.  There are no significant fluid  collections palpated on exam.  The dressings over the incisions are clean dry and intact.  JP drains are in place and functioning bilaterally.  There is approximately 50 cc of dark serosanguineous drainage in the left bulb and approximately 25 cc of serosanguineous drainage in the right bulb. Extremities: There is some swelling noted to the lower extremities bilaterally, a little bit more swelling noted to the left lower extremity.  There is no overlying erythema to either calf, there is no tenderness to palpation to either calf.  Lab Results:     Latest Ref Rng & Units 10/22/2022    3:26 AM 10/21/2022    4:31 PM 10/21/2022    5:19 AM  CBC  WBC 4.0 - 10.5 K/uL 9.7  12.5  11.9   Hemoglobin 12.0 - 15.0 g/dL 10.2  10.9  11.5   Hematocrit 36.0 - 46.0 % 32.6  33.2  34.9   Platelets 150 - 400 K/uL 186  191  197     BMET Recent Labs    10/21/22 0519  NA 134*  K 4.6  CL 105  CO2 22  GLUCOSE 147*  BUN 6*  CREATININE 0.53  CALCIUM 8.7*   PT/INR No results for input(s): "LABPROT", "INR" in the last 72 hours. ABG No results for input(s): "PHART", "HCO3" in the last 72 hours.  Invalid input(s): "PCO2", "PO2"  Studies/Results: No results found.  Anti-infectives: Anti-infectives (From admission, onward)    Start     Dose/Rate Route Frequency Ordered Stop   10/20/22 2200  sulfamethoxazole-trimethoprim (BACTRIM DS) 800-160 MG per tablet 1  tablet        1 tablet Oral 2 times daily 10/20/22 2107     10/20/22 2334  ceFAZolin 1 g / gentamicin 80 mg in NS 500 mL surgical irrigation  Status:  Discontinued          As needed 10/20/22 0906 10/20/22 1156   10/20/22 0730  ceFAZolin 1 g / gentamicin 80 mg in NS 500 mL surgical irrigation  Status:  Discontinued         Irrigation  Once 10/20/22 0732 10/20/22 1559   10/20/22 0615  ceFAZolin (ANCEF) IVPB 2g/100 mL premix        2 g 200 mL/hr over 30 Minutes Intravenous On call to O.R. 10/20/22 0602 10/20/22 0841       Assessment/Plan: s/p  Procedure(s): RIGHT MASTECTOMY WITH RIGHT SENTINEL LYMPH NODE BIOPSY LEFT TOTAL MASTECTOMY BREAST RECONSTRUCTION WITH PLACEMENT OF TISSUE EXPANDER AND FLEX HD (ACELLULAR HYDRATED DERMIS)  Plan: -Left lower extremity venous duplex ordered, follow-up results -Continue compression with Ace bandage -Continue to monitor drain output and monitor vitals -I encouraged the patient to drink plenty of water and to eat her meals.  I also encouraged the patient to get out of bed to ambulate -Continue zofran and compazine for nausea  -Continue pain control regimen   LOS: 1 day    [name], PA-C 10/22/2022

## 2022-10-22 NOTE — Progress Notes (Signed)
[name] to be D/C'd  per MD order.  Discussed with the patient and all questions fully answered.  VSS, Skin clean, dry and intact without evidence of skin break down, no evidence of skin tears noted.  IV catheter discontinued intact. Site without signs and symptoms of complications. Dressing and pressure applied.  An After Visit Summary was printed and given to the patient.   D/c education completed with patient/family including follow up instructions, medication list, d/c activities limitations if indicated, with other d/c instructions as indicated by MD - patient able to verbalize understanding, all questions fully answered.   Patient instructed to return to ED, call 911, or call MD for any changes in condition.   Patient to be escorted via [place], and D/C home via private auto.

## 2022-10-22 NOTE — Progress Notes (Signed)
Mobility Specialist - Progress Note   10/22/22 1511  Mobility  Activity Ambulated with assistance to bathroom  Level of Assistance Minimal assist, patient does 75% or more  Assistive Device Front wheel walker  Distance Ambulated (ft) 5 ft  Activity Response Tolerated well  $Mobility charge 1 Mobility    Pt received in bed requesting to have bath in BR. MinA required to help pt sit EOB. Left in BR w/ NT present.   Paulla Dolly Mobility Specialist

## 2022-10-23 ENCOUNTER — Telehealth: Payer: Self-pay | Admitting: Plastic Surgery

## 2022-10-23 NOTE — Telephone Encounter (Signed)
Lyndee Leo called the daughter, LMVM

## 2022-10-23 NOTE — Telephone Encounter (Signed)
I found the daughter's number and attempted to call her. She did not answer. LVM

## 2022-10-23 NOTE — Telephone Encounter (Signed)
Antibiotic of Bactrim that she was on in the hospital and needs to be called in to her pharmacy [facility] on [address].  Please give call back to confirm with daughter.

## 2022-10-26 ENCOUNTER — Ambulatory Visit: Payer: Medicare Other | Admitting: Hematology and Oncology

## 2022-10-26 LAB — SURGICAL PATHOLOGY

## 2022-10-26 NOTE — Progress Notes (Signed)
Patient Care Team: Willey Blade, MD as PCP - General (Internal Medicine) Stark Klein, MD as Consulting Physician (General Surgery) Nicholas Lose, MD as Consulting Physician (Hematology and Oncology) Gery Pray, MD as Consulting Physician (Radiation Oncology) Rockwell Germany, RN as Oncology Nurse Navigator Mauro Kaufmann, RN as Oncology Nurse Navigator  DIAGNOSIS:  Encounter Diagnosis  Name Primary?   Malignant neoplasm of upper-outer quadrant of right breast in female, estrogen receptor positive ([place]) Yes    SUMMARY OF ONCOLOGIC HISTORY: Oncology History  Malignant neoplasm of upper-outer quadrant of right breast in female, estrogen receptor positive ([place])  07/24/2022 Initial Diagnosis   Screening mammogram detected right breast asymmetry by ultrasound measured 1.2 cm at 7 o'clock position.  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  Left breast biopsy came back benign.   08/05/2022 Cancer Staging   Staging form: Breast, AJCC 8th Edition - Clinical stage from 08/05/2022: Stage IA (cT1c, cN0, cM0, G2, ER+, PR+, HER2-) - Signed by Nicholas Lose, MD on 08/05/2022 Stage prefix: Initial diagnosis Histologic grading system: 3 grade system    Genetic Testing   [facility] CancerNext-Expanded Panel was Negative. Report date is 08/20/2022.  The CancerNext-Expanded gene panel offered by [facility] and includes sequencing, rearrangement, and RNA analysis for the following 77 genes: AIP, ALK, APC, ATM, AXIN2, BAP1, BARD1, BLM, BMPR1A, BRCA1, BRCA2, BRIP1, CDC73, CDH1, CDK4, CDKN1B, CDKN2A, CHEK2, CTNNA1, DICER1, FANCC, FH, FLCN, GALNT12, KIF1B, LZTR1, MAX, MEN1, MET, MLH1, MSH2, MSH3, MSH6, MUTYH, NBN, NF1, NF2, NTHL1, PALB2, PHOX2B, PMS2, POT1, PRKAR1A, PTCH1, PTEN, RAD51C, RAD51D, RB1, RECQL, RET, SDHA, SDHAF2, SDHB, SDHC, SDHD, SMAD4, SMARCA4, SMARCB1, SMARCE1, STK11, SUFU, TMEM127, TP53, TSC1, TSC2, VHL and XRCC2 (sequencing and  deletion/duplication); EGFR, EGLN1, HOXB13, KIT, MITF, PDGFRA, POLD1, and POLE (sequencing only); EPCAM and GREM1 (deletion/duplication only).    10/20/2022 Surgery   Left mastectomy: Benign, 2 benign lymph nodes Right mastectomy: Grade 3 ILC 2.1 cm, LCIS, margins negative, 0/4 lymph nodes, ER 95%, PR 80%, HER2 negative 1+, Ki-67 40%     CHIEF COMPLIANT: Follow-up after surgery to discuss final pathology report  INTERVAL HISTORY: Elizabeth Benton is a 76 y.o. female is here because of recent diagnosis of right breast cancer. She presents to the clinic for a follow-up to review pathology report.  She is healing and recovering very well from recent surgery.  She decided to do bilateral mastectomies because there was concern in the contralateral breast about problems which would require close follow-up and additional biopsies.    ALLERGIES:  is allergic to contrast media [iodinated contrast media], demerol, and iodine.  MEDICATIONS:  Current Outpatient Medications  Medication Sig Dispense Refill   acetaminophen (TYLENOL) 650 MG CR tablet Take 650 mg by mouth every 8 (eight) hours as needed for pain.     ALPRAZolam (XANAX) 0.25 MG tablet Take 0.25-0.5 mg by mouth every 8 (eight) hours as needed for anxiety.     amLODipine (NORVASC) 5 MG tablet Take 1.5 tablets (7.5 mg total) by mouth daily. (Patient taking differently: Take 5 mg by mouth daily.) 120 tablet 1   betamethasone dipropionate 0.05 % cream Apply 1 Application topically 2 (two) times daily as needed for rash.     Carboxymethylcellul-Glycerin (LUBRICATING EYE DROPS OP) Place 1 drop into both eyes daily as needed (dry eyes).     diazepam (VALIUM) 2 MG tablet Take 1 tablet (2 mg total) by mouth every 12 (twelve) hours as needed for  up to 10 doses for muscle spasms. 10 tablet 0   gabapentin (NEURONTIN) 100 MG capsule Take 1 capsule (100 mg total) by mouth 2 (two) times daily. 60 capsule 0   HYDROcodone-acetaminophen (NORCO/VICODIN) 5-325 MG  tablet Take 1-2 tablets by mouth every 4 (four) hours as needed for moderate pain. 15 tablet 0   ketorolac (TORADOL) 10 MG tablet Take 1 tablet (10 mg total) by mouth every 8 (eight) hours as needed for up to 10 doses. 10 tablet 0   methocarbamol (ROBAXIN) 500 MG tablet Take 1 tablet (500 mg total) by mouth every 6 (six) hours as needed for muscle spasms. 30 tablet 1   ondansetron (ZOFRAN) 8 MG tablet Take 1 tablet (8 mg total) by mouth every 8 (eight) hours as needed for up to 40 doses for nausea or vomiting. 20 tablet 1   rosuvastatin (CRESTOR) 20 MG tablet TAKE 1 TABLET BY MOUTH EVERY DAY 90 tablet 3   senna (SENOKOT) 8.6 MG TABS tablet Take 1 tablet (8.6 mg total) by mouth 2 (two) times daily. 120 tablet 0   traMADol (ULTRAM) 50 MG tablet Take 1 tablet (50 mg total) by mouth every 8 (eight) hours as needed for up to 20 doses. 20 tablet 0   No current facility-administered medications for this visit.    PHYSICAL EXAMINATION: ECOG PERFORMANCE STATUS: 1 - Symptomatic but completely ambulatory  Vitals:   11/02/22 1138  BP: (!) 140/68  Pulse: 93  Resp: 18  Temp: (!) 97.5 F (36.4 C)  SpO2: 98%   Filed Weights   11/02/22 1138  Weight: 144 lb 12.8 oz (65.7 kg)      LABORATORY DATA:  I have reviewed the data as listed    Latest Ref Rng & Units 10/21/2022    5:19 AM 10/13/2022    3:50 PM 08/05/2022   12:28 PM  CMP  Glucose 70 - 99 mg/dL 147  112  134   BUN 8 - 23 mg/dL _0 Creatinine 0.44 - 1.00 mg/dL 0.53  0.76  0.75   Sodium 135 - 145 mmol/L 134  139  139   Potassium 3.5 - 5.1 mmol/L 4.6  3.7  3.9   Chloride 98 - 111 mmol/L 105  107  107   CO2 22 - 32 mmol/L _1 Calcium 8.9 - 10.3 mg/dL 8.7  9.3  9.5   Total Protein 6.5 - 8.1 g/dL   7.5   Total Bilirubin 0.3 - 1.2 mg/dL   0.4   Alkaline Phos 38 - 126 U/L   62   AST 15 - 41 U/L   20   ALT 0 - 44 U/L   19     Lab Results  Component Value Date   WBC 9.7 10/22/2022   HGB 10.2 (L) 10/22/2022   HCT 32.6  (L) 10/22/2022   MCV 87.2 10/22/2022   PLT 186 10/22/2022   NEUTROABS 5.6 08/05/2022    ASSESSMENT & PLAN:  Malignant neoplasm of upper-outer quadrant of right breast in female, estrogen receptor positive ([place]) 07/24/2022: Screening mammogram detected right breast asymmetry by ultrasound measured 1.2 cm at 7 o'clock position.  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  Left breast biopsy came back benign.   10/20/2022:Left mastectomy: Benign, 2 benign lymph nodes Right mastectomy: Grade 3 ILC 2.1 cm, LCIS, margins negative, 0/4 lymph nodes, ER 95%, PR 80%, HER2  negative 1+, Ki-67 40%  Pathology counseling: I discussed the final pathology report of the patient provided  a copy of this report. I discussed the margins as well as lymph node surgeries. We also discussed the final staging along with previously performed ER/PR and HER-2/neu testing.  Treatment plan: Oncotype DX to determine if she would benefit from chemotherapy Ajuvant antiestrogen therapy  Return to clinic based upon Oncotype DX test result.  Anastrozole counseling: We discussed the risks and benefits of anti-estrogen therapy with aromatase inhibitors. These include but not limited to insomnia, hot flashes, mood changes, vaginal dryness, bone density loss, and weight gain. We strongly believe that the benefits far outweigh the risks. Patient understands these risks and consented to starting treatment. Planned treatment duration is 5-7 years.  If the Oncotype comes back low risk then we will send a prescription for anastrozole that she can start and then follow-up 3 months later with Mendel Ryder for survivorship care plan visit. If the Oncotype is high risk then I will need to see her immediately to talk about adjuvant chemotherapy.  No orders of the defined types were placed in this encounter.  The patient has a good understanding of the overall plan. she agrees with it. she will call  with any problems that may develop before the next visit here. Total time spent: 30 mins including face to face time and time spent for planning, charting and co-ordination of care   Harriette Ohara, MD 11/02/22    I Gardiner Coins am scribing for Dr. Lindi Adie  I have reviewed the above documentation for accuracy and completeness, and I agree with the above.

## 2022-10-27 ENCOUNTER — Telehealth: Payer: Self-pay | Admitting: *Deleted

## 2022-10-27 ENCOUNTER — Encounter: Payer: Self-pay | Admitting: *Deleted

## 2022-10-27 NOTE — Telephone Encounter (Signed)
Ordered oncotype per Dr. Gudena.  Requisition sent to pathology and exact sciences. 

## 2022-10-28 ENCOUNTER — Encounter: Payer: Self-pay | Admitting: Student

## 2022-10-28 ENCOUNTER — Ambulatory Visit (INDEPENDENT_AMBULATORY_CARE_PROVIDER_SITE_OTHER): Payer: 59 | Admitting: Student

## 2022-10-28 DIAGNOSIS — C50911 Malignant neoplasm of unspecified site of right female breast: Secondary | ICD-10-CM

## 2022-10-28 MED ORDER — KETOROLAC TROMETHAMINE 10 MG PO TABS: 10.0000 mg | ORAL_TABLET | Freq: Three times a day (TID) | ORAL | 0 refills | Status: DC | PRN

## 2022-10-28 MED ORDER — DIAZEPAM 2 MG PO TABS: 2.0000 mg | ORAL_TABLET | Freq: Two times a day (BID) | ORAL | 0 refills | Status: DC | PRN

## 2022-10-28 NOTE — Progress Notes (Addendum)
Patient is a 76 year old female with history of right breast cancer.  Patient underwent right mastectomy with sentinel node biopsy and left mastectomy with Dr. Barry Dienes followed by bilateral breast tissue expander placement with Dr. Erin Hearing on 10/20/2022.  Intraoperatively, she had 650 cc expanders placed bilaterally. Patient had 300 cc of saline placed on the left and 400 cc of saline placed on the right.  Patient was kept in the hospital for 2 nights for observation and pain control.  Patient was discharged on 10/22/2022.  Patient presents to the clinic today for postoperative follow-up.  Patient is accompanied by her daughter today. Patient reports she is not doing well. She states that she has been experiencing pain to her right breast that she feels is worsening. She states her left breast has not given her any issues. She reports that it feels like a pulling / stabbing at her inframammary region to the right breast. Patient states the pain is very positional. She reports the medications help a little bit. Patient states she wants the expanders out.   She denies any redness over either breast. She denies any fevers or chills. She reports she put out about 80 cc from her left drain yesterday and 50 cc from the right drain.   Chaperone present on exam. On exam, expanders are in place bilaterally and are fairly symmetric. There is no overlying erythema. There is some mild ecchymosis. There are no subcutaneous fluid collections palpated. Mepilex border dressings are in place are clean, dry and intact. These were removed. Incisions are intact bilaterally with prolene sutures. There is no surrounding erythema, drainage or swelling. There are no signs of infection on exam. JP drains are in place bilaterally and are functioning. There is serosanguinous drainage in each bulb.   I discussed with the patient that we can remove saline to help with some of the pain. Patient was in agreement with the plan.   We  removed saline from each expander using a sterile technique: Right: 50 cc for a total of 350 cc / 650 cc Left: 50 cc for a total of 250 cc / 650 cc   The fluid removed from each expander was clean and clear. Patient felt some immediate relief after fluid removal, especially to the right side.   Dr. Lovena Le also had the opportunity to meet with the patient and talk about her pain and potential next steps.  We discussed with the patient that we will see how she does after the fluid removal and start her on PO toradol and PO valium. We discussed with the patient to stop taking her robaxin. We also discussed with the patient to use caution when taking her hydrocodone at home and to not take it at the same time as valium. I instructed the patient to not take ibuprofen while taking toradol. Patient expressed understanding.   Dr. Lovena Le also discussed with the patient that if she does not have relief in her pain over the next few days to a week we can make a plan with the patient to remove the expanders. The patient expressed understanding and was in agreement with this plan.   We discussed with the patient that we will leave the drains in at this time given her output and that they may be able to be removed at her next visit depending on what her output is at that time.   I discussed with the patient and the patient's daughter that they should continue to monitor the surgical sites  and that if they notice any redness, worsening pain, if the patient develops fevers or chills, or any worsening symptoms that she should reach out to [place]. Patient and patient's daughter expressed understanding.   Patient to follow up next week.   I instructed the patient and the patient's daughter to call if they have any questions or concerns.

## 2022-11-02 ENCOUNTER — Other Ambulatory Visit: Payer: Self-pay

## 2022-11-02 ENCOUNTER — Inpatient Hospital Stay: Payer: 59 | Attending: Hematology and Oncology | Admitting: Hematology and Oncology

## 2022-11-02 VITALS — BP 140/68 | HR 93 | Temp 97.5°F | Resp 18 | Ht 60.0 in | Wt 144.8 lb

## 2022-11-02 DIAGNOSIS — Z79899 Other long term (current) drug therapy: Secondary | ICD-10-CM | POA: Insufficient documentation

## 2022-11-02 DIAGNOSIS — C50411 Malignant neoplasm of upper-outer quadrant of right female breast: Secondary | ICD-10-CM | POA: Diagnosis not present

## 2022-11-02 DIAGNOSIS — Z170 Estrogen receptor positive status [ER+]: Secondary | ICD-10-CM | POA: Insufficient documentation

## 2022-11-02 DIAGNOSIS — Z9013 Acquired absence of bilateral breasts and nipples: Secondary | ICD-10-CM | POA: Diagnosis not present

## 2022-11-02 NOTE — Assessment & Plan Note (Signed)
07/24/2022: Screening mammogram detected right breast asymmetry by ultrasound measured 1.2 cm at 7 o'clock position.  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  Left breast biopsy came back benign.   10/20/2022:Left mastectomy: Benign, 2 benign lymph nodes Right mastectomy: Grade 3 ILC 2.1 cm, LCIS, margins negative, 0/4 lymph nodes, ER 95%, PR 80%, HER2 negative 1+, Ki-67 40%  Pathology counseling: I discussed the final pathology report of the patient provided  a copy of this report. I discussed the margins as well as lymph node surgeries. We also discussed the final staging along with previously performed ER/PR and HER-2/neu testing.  Treatment plan: Oncotype DX to determine if she would benefit from chemotherapy Adjuvant radiation therapy Adjuvant antiestrogen therapy

## 2022-11-04 ENCOUNTER — Ambulatory Visit (INDEPENDENT_AMBULATORY_CARE_PROVIDER_SITE_OTHER): Payer: 59 | Admitting: Student

## 2022-11-04 DIAGNOSIS — C50911 Malignant neoplasm of unspecified site of right female breast: Secondary | ICD-10-CM

## 2022-11-04 MED ORDER — DIAZEPAM 2 MG PO TABS: 2.0000 mg | ORAL_TABLET | Freq: Two times a day (BID) | ORAL | 0 refills | Status: DC | PRN

## 2022-11-04 NOTE — Progress Notes (Addendum)
Patient is a 76 year old female with history of right breast cancer.  Patient underwent right mastectomy with sentinel node biopsy and left mastectomy with Dr. Barry Dienes followed by bilateral breast tissue expander placement Dr. Erin Hearing on 10/20/2022. She presents to the clinic today for postoperative follow up.   Patient was last seen in the clinic on 10/28/2022.  At this visit, patient reported that she is experiencing pain to her right breast and reported she wanted the expanders out due to the pain.  On exam, the expanders were in place bilaterally.  There is no overlying erythema or ecchymosis.  There are no signs of infections on exam.  JP drains were in place and functioning bilaterally.  50 cc of saline were removed from each expander, for total of 350 cc / 650 cc in the right breast and 250 cc / 650 cc in the left breast.  Patient felt immediate relief after fluid removal.  Plan was for patient to start p.o. Toradol and Valium.  It was discussed with the patient that if her pain did not improve over the next few days to a week, we would plan to move forward with taking the expanders out.  Patient was in agreement with this plan.  Plan is for patient to follow-up in 1 week or call sooner if her symptoms did not improve.  Patient is accompanied by her husband at bedside today.  Today, patient reports she is doing much better.  She states that the sharp pain to the right inframammary region has completely resolved.  She reports that she is able to move around much better than she was last week.  She does report she is still experiences some pain from time to time, which she has been taking Valium and Tylenol for.  Patient denies any fevers or chills.  Patient reports that her right drain has been putting out approximately 10 cc to 20 cc/day, and her left drain has been putting out approximately 30 cc to 40 cc/day.  Patient states she does not want a fill today.  Patient also reports that chemotherapy may  potentially be a possibility for further treatment.  She states that she is waiting on the surgical pathology and will be following up with her oncologist in regards to starting chemotherapy.  Patient states that for now she would like to keep the expanders in, but if she is going to have to do chemotherapy she will most likely want to take the expanders out.  She reports that she has not made a final decision yet though.  Chaperone present on exam.  On exam, patient is sitting upright in no acute distress.  Expanders are in place bilaterally.  There is a small area of mild irritation noted near each armpit, consistent with where her binder and her Ace wrap's have been residing.  It does not appear infectious.  Otherwise there is no overlying erythema or ecchymosis to either breast.  Incisions are intact with Prolene sutures in place.  There are no subcutaneous fluid collections palpated on exam.  JP drains are in place and functioning bilaterally.  There is serosanguineous drainage in each bulb.  I discussed with the patient that she can switch to a compressive sports bra.  Patient states she brought 1 with her today and will put it on at the end of today's visit.  I discussed with the patient to monitor the areas of irritation closely.  I discussed with her that she can put a small amount of  lotion such as CeraVe over the area or she can put hydrocortisone cream if it is bothering her.  I discussed with her to not put these lotions or creams over her incision sites.  Patient expressed understanding.  I instructed the patient to call us if she feels that the area becomes more red, her pain worsens, or if she develops fevers or chills.  Patient expressed understanding.  I discussed with the patient that if her drain output continues to decrease, we can plan to possibly remove them next week.  I discussed with the patient we will also plan on removing her Prolene sutures at her next visit.  I also discussed  with her we can possibly do a fill at her next visit as well.  Patient expressed understanding and was in agreement with this plan.  I will send the patient a few more pills of Valium given that she is still having some pain.  I discussed with her that she is not to take a muscle relaxer at the same time as Valium.  Patient expressed understanding.   I discussed with the patient to let us know when she has made a decision regarding her expanders.  Patient expressed understanding.  Patient to follow-up on Monday.  I instructed the patient to call in the meantime if she has any questions or concerns.

## 2022-11-09 ENCOUNTER — Telehealth: Payer: Self-pay | Admitting: Student

## 2022-11-09 ENCOUNTER — Ambulatory Visit (INDEPENDENT_AMBULATORY_CARE_PROVIDER_SITE_OTHER): Payer: 59 | Admitting: Student

## 2022-11-09 ENCOUNTER — Ambulatory Visit
Admission: RE | Admit: 2022-11-09 | Discharge: 2022-11-09 | Disposition: A | Discharge status: Home / Self Care | Payer: 59 | Source: Ambulatory Visit | Attending: Student | Admitting: Student

## 2022-11-09 VITALS — BP 160/82 | HR 91 | Wt 144.0 lb

## 2022-11-09 DIAGNOSIS — C50911 Malignant neoplasm of unspecified site of right female breast: Secondary | ICD-10-CM

## 2022-11-09 DIAGNOSIS — M7989 Other specified soft tissue disorders: Secondary | ICD-10-CM

## 2022-11-09 NOTE — Telephone Encounter (Addendum)
Ultrasound study to the left lower extremity to rule out DVT was negative.  I called the patient with the results of her ultrasound.  I encouraged the patient to continue to ambulate, wear compressive socks and to elevate her legs.  Patient expressed understanding.

## 2022-11-09 NOTE — Progress Notes (Signed)
Patient is a 76 year old female with history of right breast cancer.  Patient underwent right mastectomy with sentinel node biopsy and left mastectomy with Dr. Barry Dienes followed by bilateral breast tissue expander placement with Dr. Erin Hearing on 10/20/2022.  Patient presents to the clinic for postoperative follow-up.  Patient was last seen in the clinic on 11/04/2022.  Patient reported at this visit she was doing much better than her previous visit.  She reported she still does experience pain from time to time, but it is much improved.  She reports that her right drain was putting out approximately 10 cc to 20 cc/day and her left drain is putting out approximately 30 cc to 40 cc/day.  On exam, there is some irritation noted near each axillary region consistent with skin irritation from the binder and Ace wrap.  Incisions were intact with Prolene sutures in place.  Drains were in place bilaterally and functioning.  Plan was for patient to follow-up in the next week for possible drain removal, possible expander fill and removal of Prolene sutures.  Patient is accompanied by her husband at bedside today.  Today, patient reports she is doing okay.  She states that she feels the expanders are heavy and uncomfortable.  She states that she wants the expanders out for sure.  Patient reports that she is still unsure if she is going to have to undergo chemotherapy, but will be finding out on Friday if she will have to.  Patient reports the drains have been putting out approximately 50 to 60 cc on the left and 30 to 40 cc on the right.  Patient also mentions that she has a small knot that is forming in her left axillary region and right axillary region.    Patient also states that she has been experiencing lower extremity swelling.  She states that the left lower extremity is more swollen than the right.  She states that there is some improvement with elevation, but the left does become more swollen when she sits.  She  states that she has been ambulating.  She denies any chest pain.  Patient reports she sometimes gets a little winded when exerting herself but denies any new significant shortness of breath or difficulty breathing.  Today's Vitals   11/09/22 1446  BP: (!) 160/82  Pulse: 91  SpO2: 98%  Weight: 144 lb (65.3 kg)   Body mass index is 28.12 kg/m.   Chaperone present on exam.  On exam, patient is sitting upright in no acute distress.  Her breathing is unlabored and she is in no respiratory distress.  Expanders are in place bilaterally.  There is no overlying erythema or ecchymosis.  Incisions are intact with Prolene sutures bilaterally.  There are small areas of firmness to each axillary region, the left is slightly larger than the right.  There are no overlying skin changes to either of these areas.  JP drains are in place and functioning bilaterally.  There is serosanguineous drainage in each bulb, the left drainage slightly darker than the right.   We aspirated saline from each expander using a sterile technique: Right: 100 cc for a total of 250 cc / 650 cc Left: 100 cc for a total of 150 cc / 650 cc   The fluid removed from each expander was clean and clear.  Prolene sutures were removed from each of the incisions.  Patient tolerated well.  Patient had similar swelling to the left lower extremity during her hospitalization after her surgery.  An ultrasound was obtained at that time to rule out DVT.  Ultrasound was negative for DVT at that time.  On exam today, patient is breathing at approximately 16 breaths/min.  Her breathing is unlabored and she is in no distress.  She is not tachycardic.  Her oxygenation is 98%.  Suspicion for PE is very low.  It appears her symptoms when she is exerting herself seem more related to mild deconditioning from recovering from major surgery rather than PE.  I ordered an ultrasound to the left lower extremity to rule out DVT.  I encouraged the patient to  continue to ambulate, wear compressive socks and to elevate her legs.  Patient expressed understanding. I also ordered ultrasounds to the bilateral axillary regions.  I discussed with the patient that we will plan to get her scheduled for surgery to have the expanders removed.  I discussed this with Dr. Lovena Le and he was in agreement with this plan.  I discussed with the patient that we we will keep her drains and given her drain output.  I discussed with the patient that she should apply Vaseline daily to her incision sites.  Patient to follow-up next week for reevaluation and possible drain removal.  I instructed the patient to call if she has any questions or concerns in the meantime.  I discussed with the patient that if she experiences any worsening shortness of breath or chest pain she needs to be evaluated in the emergency room.  Patient expressed understanding.  Patient's vital signs for today's encounter are as follows: BP (!) 160/82 (BP Location: Right Arm, Patient Position: Sitting, Cuff Size: Small)   Pulse 91   Wt 144 lb (65.3 kg)   SpO2 98%   BMI 28.12 kg/m   Patient is encouraged to follow-up with their primary care provider for evaluation and management of their mildly elevated blood pressure.  Advised them to obtain an at-home automatic blood pressure cuff.  She states that she has one at home and will regularly check her blood pressure and follow-up with her primary care doctor.

## 2022-11-10 ENCOUNTER — Other Ambulatory Visit: Payer: Self-pay | Admitting: Student

## 2022-11-10 ENCOUNTER — Telehealth: Payer: Self-pay | Admitting: Student

## 2022-11-10 ENCOUNTER — Telehealth: Payer: Self-pay | Admitting: Plastic Surgery

## 2022-11-10 ENCOUNTER — Encounter: Payer: Self-pay | Admitting: *Deleted

## 2022-11-10 DIAGNOSIS — Z719 Counseling, unspecified: Secondary | ICD-10-CM

## 2022-11-10 NOTE — Telephone Encounter (Signed)
LVM and My Chart message that we received FMLA paperwork and there is a 25.00 charge to have these filled out and to please contact our office to handle this payment.

## 2022-11-10 NOTE — Telephone Encounter (Signed)
Patient called today stating that she has been trying to get in for an appointment for the ultrasounds for her bilateral axilla that I ordered yesterday.  I spoke to the patient regarding this.  She states that she is unable to get an appointment until the middle of December.  I discussed with the patient that given we will most likely do surgery before then, we can cancel the appointment for the ultrasounds.  Patient expressed understanding and was in agreement with this plan.

## 2022-11-11 ENCOUNTER — Encounter: Payer: Medicare Other | Admitting: Student

## 2022-11-11 NOTE — Addendum Note (Signed)
Addended by: Donnamarie Rossetti on: 11/11/2022 12:51 PM   Modules accepted: Orders

## 2022-11-12 ENCOUNTER — Telehealth: Payer: Self-pay | Admitting: Student

## 2022-11-12 NOTE — Telephone Encounter (Signed)
Patient called the clinic today stating that although she still wants the expanders out, she would like to wait until after the holidays to get them out.  She states that she is going to find out more information tomorrow about whether she is going to undergo chemotherapy or not.  I told her that it should most likely not be an issue to take the expanders out after the holiday season, but I will discuss it with Dr. Lovena Le.  I also told her I would discuss the fact that she may be undergoing chemotherapy at that time and needing surgery with Dr. Lovena Le.  Patient expressed understanding.  She is following up in the clinic on Monday and we can discuss it more then.  Patient was in agreement with this plan.

## 2022-11-12 NOTE — Telephone Encounter (Signed)
Would like PA to call her back sometime today.

## 2022-11-16 ENCOUNTER — Ambulatory Visit (INDEPENDENT_AMBULATORY_CARE_PROVIDER_SITE_OTHER): Payer: 59 | Admitting: Student

## 2022-11-16 DIAGNOSIS — C50911 Malignant neoplasm of unspecified site of right female breast: Secondary | ICD-10-CM

## 2022-11-16 NOTE — Progress Notes (Signed)
Patient is a 76 year old female with history of right breast cancer.  Patient underwent right mastectomy with sentinel node biopsy and left mastectomy with Dr. Barry Dienes followed by bilateral breast tissue expander placement with Dr. Erin Hearing on 10/20/2022.  Patient presents to the clinic for postoperative follow-up.     Patient was last seen in the clinic on 11/09/2022.  At this visit, she reported she was doing okay.  She reported that the expanders felt heavy and uncomfortable.  Her drains are still putting out approximately 50 to 60 cc on the left and 30 to 40 cc on the right.  Patient also reported she is experiencing some lower extremity swelling, more so on the right than the left.  Patient also reported at this visit she would like to have her expanders removed.  On exam, there were small areas of firmness to each axillary region, the left was slightly larger than the right.  There were no overlying skin changes.  JP drains were in place bilaterally and functioning.  There is serosanguineous drainage in each bulb.  100 cc of saline was aspirated from each expander for patient's comfort for a total of 250 cc / 650 cc in the right expander and 150 cc for a total of 150 cc / 650 cc in the left expander.  The Prolene sutures were also removed from the incisions bilaterally.  Ultrasound of the left lower extremity was obtained.  This was negative for DVT.  Drains were kept in place given their outputs.  Patient was to follow-up in 1 week for reevaluation and possible drain removal.  Plan was for patient to also discuss further scheduling of her surgery after she found out more if she was going to need chemotherapy.  Patient is accompanied by her husband at bedside today.  Today, patient reports she is doing well.  She states that overall she is a lot more comfortable than she was last week after removing the fluid.  She reports that the bottom of the expander still bother her against her rib cage, but she has  been putting cloths rolls underneath the bottom of her breasts to help alleviate some of the pressure.  Patient also reports that her drain output has been approximately 40 to 50 cc from the right drain and approximately 30 to 40 cc from the left drain daily.  Patient reports that her leg swelling has improved since last week.  She states that she has been working on elevating her legs.  Patient states that she has not heard from her oncologist about whether or not she will need chemotherapy.  She states she is going to reach out to them tomorrow.  Patient reports that she still does want her expanders out.  She is requesting that this happen at the beginning of January after the holidays.  Chaperone present on exam.  On exam, patient is sitting upright in no acute distress.  Expanders are in place bilaterally.  There is no overlying erythema or ecchymosis.  Incisions appear to be intact bilaterally.  There is still little bit of firmness to the left axillary region that appears fairly similar /slightly improved from the previous exam.  There is no overlying erythema to the skin to this area.  The area of firmness appears to have significantly improved to the right axillary region.  There is no overlying erythema to this area.  JP drains are in place and functioning bilaterally.  There is serosanguineous drainage in the bulbs bilaterally.  Lower  extremities appear to be in similar sized and left lower extremity appears to be improved from previous exam.  I discussed with the patient that her drain output is still fairly high to pull the drains out at this time.  I discussed with the patient the risk of infection leaving the drains and, but I also discussed the risk of infection if she were to develop fluid collections.  I also discussed with the patient that if we remove more fluid, this could potentially not help to facilitate the drainage of the fluid.  I recommended to the patient that we not aspirate any  fluid from her expanders today, and leave the drains in for another week.  Patient expressed understanding and was in agreement to leave the drains in at this time and wait on another aspiration of the tissue expanders.  Drains were reinforced with Mepilex border dressings.  I discussed with the patient to continue to ambulate and elevate her lower extremities.  I discussed with her that she should apply Vaseline daily to her incisions bilaterally.  Patient expressed understanding.  I discussed with the patient that I talked with Dr. Lovena Le about performing her surgery at the beginning of January.  I discussed that although she can undergo surgery at the same time she is doing chemotherapy per Dr. Lovena Le, it may slightly increase her risk of delayed wound healing and it may add to more discomfort to her while she is undergoing chemotherapy.  Patient expressed understanding and states that she still wants to move forward with removing her expanders at the beginning of the year.  Patient is to follow-up in 1 week for possible drain removal and reevaluation.  I instructed the patient to call if she has any questions or concerns in the meantime.

## 2022-11-17 ENCOUNTER — Telehealth: Payer: Self-pay | Admitting: *Deleted

## 2022-11-17 NOTE — Telephone Encounter (Signed)
Spoke with patient's daughter Mackie Pai regarding oncotype results that we have not received yet.  Informed her when I called yesterday that they are waiting on insurance approval and as soon as they get that they would release the results.  Also let her know that sometimes if the patient calls and complains it will speed up the process.  She states she will help her mother do that and I told her that I would be on the lookout and as soon as I see the results would give her a call.  Patient's daughter appreciative.

## 2022-11-20 ENCOUNTER — Encounter: Payer: Self-pay | Admitting: *Deleted

## 2022-11-23 ENCOUNTER — Telehealth: Payer: Self-pay

## 2022-11-23 ENCOUNTER — Ambulatory Visit (INDEPENDENT_AMBULATORY_CARE_PROVIDER_SITE_OTHER): Payer: 59 | Admitting: Student

## 2022-11-23 VITALS — BP 136/84 | HR 92 | Resp 18

## 2022-11-23 DIAGNOSIS — C50911 Malignant neoplasm of unspecified site of right female breast: Secondary | ICD-10-CM

## 2022-11-23 DIAGNOSIS — Z9889 Other specified postprocedural states: Secondary | ICD-10-CM

## 2022-11-23 NOTE — Addendum Note (Signed)
Addended by: Donnamarie Rossetti on: 11/23/2022 04:58 PM   Modules accepted: Orders

## 2022-11-23 NOTE — Telephone Encounter (Signed)
Faxed [place] order to the [facility] with confirmed receipt.

## 2022-11-23 NOTE — Progress Notes (Addendum)
Patient is a 76 year old female with history of right breast cancer.  Patient underwent right mastectomy with sentinel node biopsy and left mastectomy with Dr. Barry Dienes followed by bilateral breast tissue expander placement with Dr. Erin Hearing on 10/20/2022.  Patient presents to the clinic for postoperative follow-up.         Patient was last seen in the clinic on 11/16/2022.  At this visit, she reported she was doing well.  She reported that aspiration of the fluid from the prior visit had helped alleviate some of the discomfort, but she is still having a little bit of discomfort.  Her drain was putting out approximately 40 to 50 cc from the right and 30 to 40 cc to the left.  She reported at this visit that her leg swelling had improved.  On exam, patient was sitting upright in no acute distress.  There is no overlying erythema or ecchymosis to either expander.  There is serosanguineous drainage in each of the bulbs.  There is also a small area of firmness noted to the left axillary region.  Plan was for patient to follow-up in 1 week for reevaluation and possible drain removal.  Today, patient reports she is doing well.  She states that she still having a little bit of discomfort from the weight of the expanders, but states that it is tolerable.  She denies any other issues or concerns.  She denies any fevers or chills.  Patient states that her left drain has been putting out approximately 15 to 30 cc for the past few days, and approximately 30 to 40 cc for the past few days for the right drain.  She reports that she would still like to get the expanders out in January.  Patient reports that she heard back from her oncology team and oncology team and they reported that she will most likely not need chemotherapy.  Chaperone present on exam.  On exam, patient is sitting upright in no acute distress.  Expanders are in place bilaterally.  There is no overlying erythema.  There are no significant fluid  collections palpated on exam.  There is a little bit of firmness to the left axillary region, that appears to have softened up a little bit since her previous exam.  There is no overlying erythema to the area.  Incisions are intact bilaterally.  Drains are in place and functioning bilaterally.  There is serous drainage in each of the bulbs.  The left drain was removed.  Patient tolerated well.  I discussed with the patient that we will still hold off from aspirating at this time to avoid fluid accumulation. The decision was made with the patient to keep the right drain in for now to see if the output will go down a little bit.  Patient expressed understanding and was in agreement with the plan.  I discussed with the patient that she should place gauze over the drain site daily.  Patient expressed understanding.  I discussed with the patient she should continue to apply Vaseline daily to her incision sites.  I will order an ultrasound to the left axilla given the area of firmness.   I would like the patient to follow-up on Thursday for reevaluation and possible drain removal.  I instructed the patient to call in the meantime if she has any questions or concerns.

## 2022-11-24 ENCOUNTER — Telehealth: Payer: Self-pay | Admitting: *Deleted

## 2022-11-24 ENCOUNTER — Encounter: Payer: Self-pay | Admitting: *Deleted

## 2022-11-24 DIAGNOSIS — Z170 Estrogen receptor positive status [ER+]: Secondary | ICD-10-CM

## 2022-11-24 MED ORDER — ANASTROZOLE 1 MG PO TABS: 1.0000 mg | ORAL_TABLET | Freq: Every day | ORAL | 3 refills | Status: DC

## 2022-11-24 NOTE — Telephone Encounter (Signed)
Received oncotype results of 17/5%. Left message for a return phone call regarding results.

## 2022-11-24 NOTE — Telephone Encounter (Signed)
Received call back regarding oncotype results.  Spoke with patient's daughter Elizabeth Benton. Verified pharmacy to send anastrozole to and gave instructions and let her know the patient will be called for SCP visit with Mendel Ryder in about 3 months. She verbalized understanding. Rx sent to pharmacy and schedule message sent for appt.

## 2022-11-25 ENCOUNTER — Ambulatory Visit: Payer: Medicare Other

## 2022-11-26 ENCOUNTER — Telehealth: Payer: Self-pay

## 2022-11-26 ENCOUNTER — Encounter: Payer: Self-pay | Admitting: Hematology and Oncology

## 2022-11-26 ENCOUNTER — Encounter: Payer: Self-pay | Admitting: Student

## 2022-11-26 ENCOUNTER — Ambulatory Visit (INDEPENDENT_AMBULATORY_CARE_PROVIDER_SITE_OTHER): Payer: 59 | Admitting: Student

## 2022-11-26 VITALS — BP 131/79 | HR 92

## 2022-11-26 DIAGNOSIS — C50911 Malignant neoplasm of unspecified site of right female breast: Secondary | ICD-10-CM

## 2022-11-26 NOTE — Telephone Encounter (Signed)
Faxed [place] order, demographics, insurance information, and OV note to [facility] with confirmed receipt.

## 2022-11-26 NOTE — Progress Notes (Signed)
Patient is a 76 year old female with history of right breast cancer.  Patient underwent right mastectomy with sentinel node biopsy and left mastectomy with Dr. Barry Dienes followed by bilateral breast tissue expander placement with Dr. Erin Hearing on 10/20/2022.  Since surgery, patient has decided that she would like the expanders removed early January.  Patient presents to the clinic for follow-up.  Patient was last seen in the clinic on 11/23/2022.  At this visit, patient reported she was doing well.  She reported that the expanders were tolerable.  She denied any fevers or chills.  She reported that her left drain was putting out approximately 15 to 30 cc/day and her right drain is putting out 30 to 40 cc/day.  On exam, expanders are in place bilaterally.  There is no overlying erythema.  There are no significant fluid collections palpated on exam.  There was a little bit of firmness to the left axillary region.  There is no overlying erythema to that area.  Incisions were intact bilaterally.  The left drain was removed.  Plan is for patient to apply gauze daily to her drain site.  And to continue to apply Vaseline daily to her incision sites.  Ultrasound was ordered for the left axilla given the area of firmness.  Plan is for patient to follow-up in a few days for possible drain removal to the right breast.  Patient currently has 250 cc / 650 cc in the right breast expander and 150 cc / 650 cc in the left breast expander.  Patient is accompanied by her husband at bedside today.  Patient reports she is doing well.  She states that her pain is tolerable, but the weight of the expanders is still uncomfortable.  Patient reports that her drain has been putting out very little.  She reports that its been putting out approximately 15 to 25 cc/day.  She states that the bulb has been functioning properly.  Patient reports she still would like her expanders removed in early January.  Chaperone present on exam.  On exam,  patient is sitting upright in no acute distress.  Expanders are in place bilaterally.  There is no overlying erythema or ecchymosis.  There are no significant fluid collections palpated on exam.  There is a little bit of firmness to the left axillary region, similar to previous exam.  There is no overlying erythema to this area.  Incisions appear to be intact and healing well.  JP drain to the right breast is in place and functioning.  There is serous fluid in the bulb.  Right drain was removed without difficulty.  Patient tolerated well.  Gauze was placed over the drain site.  We aspirated saline from the expander using a sterile technique: Right: 50 cc for a total of 200 cc/ 650 cc Left: 50 cc for a total of 100 cc/650 cc   I discussed with the patient that she will need to wear compression at all times.  I discussed with the patient that she should continue to place Vaseline over her incisions daily.  Patient expressed understanding.  We are still waiting patient to undergo ultrasound to the left axilla.  Patient reports she has not received a call yet regarding setting up an appointment for this from the imaging center.  I discussed with the patient that she should apply gauze over her right drain site for the next few days, and then can place Vaseline over the drain site as well in a few days.  Patient  expressed understanding.  Patient to follow-up next week for reevaluation and possible aspiration to help with comfort.  Instructed the patient to call in the meantime if she has any questions or concerns.

## 2022-12-01 NOTE — Therapy (Signed)
OUTPATIENT PHYSICAL THERAPY BREAST CANCER POST OP FOLLOW UP   Patient Name: Elizabeth Benton MRN: [id number] DOB:Sep 05, 1946, 76 y.o., female Today's Date: 12/02/2022  END OF SESSION:  PT End of Session - 12/02/22 0942     Visit Number 2    Number of Visits 14    Date for PT Re-Evaluation 01/13/23    PT Start Time 0900    PT Stop Time 0938    PT Time Calculation (min) 38 min    Activity Tolerance Patient tolerated treatment well    Behavior During Therapy [facility] for tasks assessed/performed             Past Medical History:  Diagnosis Date   Arthritis    Breast cancer ([place])    Depression    H/O migraine    HSV-2 (herpes simplex virus 2) infection    Hyperlipidemia    Hypertension    Lichen sclerosus    Migraine    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   Vaginal atrophy 12/29/2007   Vaginitis and vulvovaginitis 12/28/2004   Past Surgical History:  Procedure Laterality Date   ABDOMINAL HYSTERECTOMY  1987   BREAST RECONSTRUCTION WITH PLACEMENT OF TISSUE EXPANDER AND FLEX HD (ACELLULAR HYDRATED DERMIS) Bilateral 10/20/2022   Procedure: BREAST RECONSTRUCTION WITH PLACEMENT OF TISSUE EXPANDER AND FLEX HD (ACELLULAR HYDRATED DERMIS);  Surgeon: Lennice Sites, MD;  Location: [place];  Service: Plastics;  Laterality: Bilateral;   DILATION AND CURETTAGE OF UTERUS     MASTECTOMY W/ SENTINEL NODE BIOPSY Right 10/20/2022   Procedure: RIGHT MASTECTOMY WITH RIGHT SENTINEL LYMPH NODE BIOPSY;  Surgeon: Stark Klein, MD;  Location: [place];  Service: General;  Laterality: Right;   TONSILLECTOMY     TOTAL MASTECTOMY Left 10/20/2022   Procedure: LEFT TOTAL MASTECTOMY;  Surgeon: Stark Klein, MD;  Location: [place];  Service: General;  Laterality: Left;   TUBAL LIGATION     WISDOM TOOTH EXTRACTION     Patient Active Problem List   Diagnosis Date Noted   Breast cancer ([place]) 10/20/2022   S/P breast reconstruction 10/20/2022   Breast cancer of upper-outer quadrant of right female breast ([place])  10/20/2022   Genetic testing 08/24/2022   Family history of breast cancer 08/06/2022   Family history of prostate cancer 08/06/2022   Malignant neoplasm of upper-outer quadrant of right breast in female, estrogen receptor positive ([place]) 07/31/2022   Abnormal glucose level 12/05/2020   Constipation 12/05/2020   Lumbago with sciatica 12/05/2020   Osteopenia 12/05/2020   Overweight 12/05/2020   Screening for malignant neoplasm of colon 12/05/2020   Vitamin D deficiency 12/05/2020   Essential hypertension 11/30/2019   LBBB (left bundle branch block) 11/30/2019   Exertional dyspnea 10/24/2535   Lichen sclerosus 64/40/3474   Mixed hyperlipidemia    HSV-2 (herpes simplex virus 2) infection    Depression    H/O migraine    Monilia infection    Vaginal atrophy    Wears glasses 06/23/2011   Sinus problem/runny nose 06/23/2011   Mass on back 06/23/2011    PCP: Willey Blade, MD  REFERRING PROVIDER: Nicholas Lose, MD  REFERRING DIAG: Right Breast Cancer  THERAPY DIAG:  Malignant neoplasm of upper-outer quadrant of right breast in female, estrogen receptor positive ([place])  Abnormal posture  Stiffness of left shoulder, not elsewhere classified  Stiffness of right shoulder, not elsewhere classified  Rationale for Evaluation and Treatment: Rehabilitation  ONSET DATE: 07/24/2022  SUBJECTIVE:  SUBJECTIVE STATEMENT: Still sore where the drains came out. I started the exercises last week after the drains came out. I am struggling with the hands behind the head stretch. I am going to have the expanders out in January because they are just too painful. She does not require chemo. Pt has returned to working for [employer], but works at home.  PERTINENT HISTORY:  Patient was diagnosed on 07/24/2022 with right  grade 2 Invasive Lobular Carcinoma. It measures 1.2 cm and is located in the upper-outer quadrant. It is ER 95%, PR 80% and HER 2 negative with a Ki67 of 40%. She is s/p a Bilateral Mastectomy on 10/20/2022  for Right Breast Cancer and with left prophylactic and bilateral tissue expanders .  She has since decided that she wants the expanders removed and this will happen in January. Drains were removed on 11/23/2022,11/26/2022 and she has been told to continue compression and use vaseline over incisions. She has returned to work at home.  PATIENT GOALS:  Reassess how my recovery is going related to arm function, pain, and swelling.  PAIN:  Are you having pain? Yes: NPRS scale: 2/10 Pain location: bilateral breast from expanders Pain description: heaviness, achy and throbbing Aggravating factors: expanders Relieving factors: Tylenol  PRECAUTIONS: Recent Surgery, right UE Lymphedema risk, DDD, LBP  ACTIVITY LEVEL / LEISURE: nothing yet, returned to work   OBJECTIVE:   PATIENT SURVEYS:  QUICK DASH: 61.36  OBSERVATIONS: Pt with larger right breast  than secondary to expander filled more. Mastectomy incisions are healed with glue still present. Left axillary region with area of swelling/tissue greater than right. Pt will be having an [place] today on the left. Having expanders removed in early January.  POSTURE:  Forward head, rounded shoulders  LYMPHEDEMA ASSESSMENT:     UPPER EXTREMITY AROM/PROM:   A/PROM RIGHT   eval   Right 12/02/2022  Shoulder extension 60 49  Shoulder flexion 141 104  Shoulder abduction 168 68  Shoulder internal rotation 55   Shoulder external rotation 112                           (Blank rows = not tested)   A/PROM LEFT   eval LEFT 2023  Shoulder extension 58 46  Shoulder flexion 159 120  Shoulder abduction 165 90  Shoulder internal rotation 65   Shoulder external rotation 112                           (Blank rows = not tested)     CERVICAL  AROM: All within functional  limits: Decreased 20 % Rotation and 50% SB         UPPER EXTREMITY STRENGTH: WNL     LYMPHEDEMA ASSESSMENTS:    LANDMARK RIGHT   eval Right 12/02/2022  10 cm proximal to olecranon process 28.5 28.7  Olecranon process 24.6 24.6  10 cm proximal to ulnar styloid process 21.8 21.7  Just proximal to ulnar styloid process 16.4 16.3  Across hand at thumb web space 18.3 18.2  At base of 2nd digit 6.5 6.6  (Blank rows = not tested)   LANDMARK LEFT   eval LEFT 12/02/2022  10 cm proximal to olecranon process 29.3 29.7  Olecranon process 24.9 24.7  10 cm proximal to ulnar styloid process 21.0 21.4  Just proximal to ulnar styloid process 16.3 16.3  Across hand at thumb web space 18.0 17.9  At base of 2nd digit 6.45 6.4  (Blank rows = not tested)      Surgery type/Date: 10/20/2022 bilateral Mastectomy with right SLNB Number of lymph nodes removed: 0/2 right Current/past treatment (chemo, radiation, hormone therapy): No chemo required, will start anti-estrogen therapy Other symptoms:  Heaviness/tightness Yes Pain Yes Pitting edema No Infections No Decreased scar mobility Yes Stemmer sign No  PATIENT EDUCATION:  Education details: Reviewed 4 post op exercises. Did better with abduction sliding hand on wall Person educated: Patient Education method: Explanation and Demonstration Education comprehension: returned demonstration  HOME EXERCISE PROGRAM: Reviewed previously given post op HEP. Changed wall walk to wall slide and pt demonstrated good improvement  ASSESSMENT:  CLINICAL IMPRESSION: Pt is s/p bilateral Mastectomy with immediate expanders on 10/20/2022. She was hospitalized for 4 days due to LE swelling, but nothing was found.  She had drains removed last week. Despite compliance with HEP her shoulder ROM is very limited bilaterally, greatest on the right. Her compression bra was not comfortable so foam pads were made for the axillary areas  and the bottom band to prevent rolling. It was much more comfortable afterwards.  She has an area of ?swelling/extra tissue at left axillary region for which she has an [place] today. She is having expanders removed in January.She has returned to work at [employer] working from home. She will benefit from skilled PT to address deficits and return to PLOF  Pt will benefit from skilled therapeutic intervention to improve on the following deficits: Decreased knowledge of precautions, impaired UE functional use, pain, decreased ROM, postural dysfunction.   PT treatment/interventions: ADL/Self care home management, Therapeutic exercises, Therapeutic activity, Neuromuscular re-education, Patient/Family education, Self Care, Joint mobilization, Orthotic/Fit training, Dry Needling, Manual lymph drainage, scar mobilization, Manual therapy, and Re-evaluation   GOALS: Goals reviewed with patient? Yes  LONG TERM GOALS:  (STG=LTG)  GOALS Name Target Date  Goal status  1 Pt will demonstrate she has regained full shoulder ROM and function post operatively compared to baselines.  Baseline: 01/13/2023 INITIAL  2 Pts quick dash will be improved to pre-surgery level of 36% 11/14/2023 INITIAL  3 Pt will be able to dress and bathe and perform light household chores independently 01/13/2023 INITIAL  4 Pt will have decreased pain by 50% overall 01/13/2023 INITIAL     PLAN:  PT FREQUENCY/DURATION: 2x/week x 6 weeks  PLAN FOR NEXT SESSION: STM bilateral UQ, AAROM, PROM bilateral shoulders, MLD prn especially to left axillary region   [facility]  [address], [address]  [place] [address]  [phone]  After Breast Cancer Class It is recommended you attend the ABC class to be educated on lymphedema risk reduction. This class is free of charge and lasts for 1 hour. It is a 1-time class. You will need to download the Webex app either on your phone or computer. We will send you a link the  night before or the morning of the class. You should be able to click on that link to join the class. This is not a confidential class. You don't have to turn your camera on, but other participants may be able to see your email address.  Scar massage You can begin gentle scar massage to you incision sites. Gently place one hand on the incision and move the skin (without sliding on the skin) in various directions. Do this for a few minutes and then you can gently massage either coconut oil or vitamin E cream into the  scars.  Compression garment You should continue wearing your compression bra until you feel like you no longer have swelling.  Home exercise Program Continue doing the exercises you were given until you feel like you can do them without feeling any tightness at the end.   Walking Program Studies show that 30 minutes of walking per day (fast enough to elevate your heart rate) can significantly reduce the risk of a cancer recurrence. If you can't walk due to other medical reasons, we encourage you to find another activity you could do (like a stationary bike or water exercise).  Posture After breast cancer surgery, people frequently sit with rounded shoulders posture because it puts their incisions on slack and feels better. If you sit like this and scar tissue forms in that position, you can become very tight and have pain sitting or standing with good posture. Try to be aware of your posture and sit and stand up tall to heal properly.  Follow up PT: It is recommended you return every 3 months for the first 3 years following surgery to be assessed on the SOZO machine for an L-Dex score. This helps prevent clinically significant lymphedema in 95% of patients. These follow up screens are 10 minute appointments that you are not billed for.  Claris Pong, PT 12/02/2022, 9:43 AM

## 2022-12-02 ENCOUNTER — Ambulatory Visit
Admission: RE | Admit: 2022-12-02 | Discharge: 2022-12-02 | Disposition: A | Discharge status: Home / Self Care | Payer: 59 | Source: Ambulatory Visit | Attending: Student | Admitting: Student

## 2022-12-02 ENCOUNTER — Ambulatory Visit: Payer: 59 | Attending: Hematology and Oncology

## 2022-12-02 DIAGNOSIS — Z170 Estrogen receptor positive status [ER+]: Secondary | ICD-10-CM | POA: Insufficient documentation

## 2022-12-02 DIAGNOSIS — M25611 Stiffness of right shoulder, not elsewhere classified: Secondary | ICD-10-CM | POA: Diagnosis present

## 2022-12-02 DIAGNOSIS — M25612 Stiffness of left shoulder, not elsewhere classified: Secondary | ICD-10-CM | POA: Insufficient documentation

## 2022-12-02 DIAGNOSIS — R293 Abnormal posture: Secondary | ICD-10-CM | POA: Diagnosis present

## 2022-12-02 DIAGNOSIS — C50411 Malignant neoplasm of upper-outer quadrant of right female breast: Secondary | ICD-10-CM | POA: Diagnosis present

## 2022-12-02 DIAGNOSIS — C50911 Malignant neoplasm of unspecified site of right female breast: Secondary | ICD-10-CM

## 2022-12-02 NOTE — Progress Notes (Signed)
Patient is a 76 year old female with history of right breast cancer.  Patient underwent right mastectomy with sentinel node biopsy and left mastectomy with Dr. Barry Dienes followed by bilateral breast tissue expander placement with Dr. Erin Hearing on 10/20/2022.  Since surgery, patient is decided that she would like the expanders removed in early January.  Patient presents to the clinic for follow-up.  Patient was last seen in the clinic on 11/26/2022.  At this visit, she reported she was doing well.  She reported she was still having some discomfort to her breast from the weight of the expanders.  She reported that her right drain was putting out approximately 15 to 25 cc/day.  On exam, expanders were in place bilaterally.  There is no overlying erythema.  There were no significant fluid collections palpated on exam.  There is a little bit of firmness to the left axillary region, similar to the previous exam.  There is no overlying erythema.  Right JP drain was removed without difficulty.  50 cc were aspirated from each of the expanders for a total of 200 cc / 650 cc in the right expander and 100 cc / 650 cc in the left expander.  I discussed with the patient that she would need to continue compression at all times and continue to place Vaseline over her incisions daily.  Ultrasound was ordered for the left axilla and we were still awaiting for patient to undergo the ultrasound.  Patient wants to follow-up in 1 week for reevaluation and possible aspiration for comfort.  Left axillary ultrasound was completed yesterday 12/6. [place] results show scar tissue. There was no fluid or mass shown on ultrasound. BI-RADS category 2, Benign   Today,

## 2022-12-03 ENCOUNTER — Ambulatory Visit (INDEPENDENT_AMBULATORY_CARE_PROVIDER_SITE_OTHER): Payer: 59 | Admitting: Physician Assistant

## 2022-12-03 DIAGNOSIS — C50911 Malignant neoplasm of unspecified site of right female breast: Secondary | ICD-10-CM

## 2022-12-03 NOTE — Progress Notes (Signed)
Patient is a 76 year old female with history of right breast cancer.  Patient underwent right mastectomy with sentinel node biopsy and left mastectomy with Dr. Barry Dienes followed by bilateral breast tissue expander placement with Dr. Erin Hearing on 10/20/2022.  Since surgery, patient is decided that she would like the expanders removed in early January.  Patient presents to the clinic for follow-up.  Patient was last seen in the clinic on 11/26/2022.  At this visit, she reported she was doing well.  She reported she was still having some discomfort to her breast from the weight of the expanders.  She reported that her right drain was putting out approximately 15 to 25 cc/day.  On exam, expanders were in place bilaterally.  There is no overlying erythema.  There were no significant fluid collections palpated on exam.  There is a little bit of firmness to the left axillary region, similar to the previous exam.  There is no overlying erythema.  Right JP drain was removed without difficulty.  50 cc were aspirated from each of the expanders for a total of 200 cc / 650 cc in the right expander and 100 cc / 650 cc in the left expander.  I discussed with the patient that she would need to continue compression at all times and continue to place Vaseline over her incisions daily.  Ultrasound was ordered for the left axilla and we were still awaiting for patient to undergo the ultrasound.  Patient wants to follow-up in 1 week for reevaluation and possible aspiration for comfort.  Left axillary ultrasound was completed yesterday 12/6. [place] results show scar tissue. There was no fluid or mass shown on ultrasound. BI-RADS category 2, Benign   Today she notes she is still having pain in the bilateral breast R>L, this is resolved with lifting up her breasts. She denies any infectious signs or symptoms. She would like more fluid removed.  Chaperon present. Bilateral breast with no redness, no warmth to touch. No palpable fluid  collections. Incisions CDI.   I removed 100 ccs from the left breast, the patient felt relief from this. She will follow up in one week for re-evaluation, sooner if needed. She will continue compression. Strict return precautions given. She agreed to todays plan.

## 2022-12-08 ENCOUNTER — Ambulatory Visit: Payer: 59

## 2022-12-08 ENCOUNTER — Telehealth: Payer: Self-pay | Admitting: *Deleted

## 2022-12-08 ENCOUNTER — Encounter (HOSPITAL_COMMUNITY): Payer: Self-pay

## 2022-12-08 DIAGNOSIS — M25611 Stiffness of right shoulder, not elsewhere classified: Secondary | ICD-10-CM

## 2022-12-08 DIAGNOSIS — R293 Abnormal posture: Secondary | ICD-10-CM

## 2022-12-08 DIAGNOSIS — Z170 Estrogen receptor positive status [ER+]: Secondary | ICD-10-CM

## 2022-12-08 DIAGNOSIS — C50411 Malignant neoplasm of upper-outer quadrant of right female breast: Secondary | ICD-10-CM | POA: Diagnosis not present

## 2022-12-08 DIAGNOSIS — M25612 Stiffness of left shoulder, not elsewhere classified: Secondary | ICD-10-CM

## 2022-12-08 NOTE — Therapy (Signed)
OUTPATIENT PHYSICAL THERAPY BREAST CANCER TREATMENT   Patient Name: Elizabeth Benton MRN: [id number] DOB:02/16/1946, 76 y.o., female Today's Date: 12/08/2022  END OF SESSION:  PT End of Session - 12/08/22 0948     Visit Number 3    Number of Visits 14    Date for PT Re-Evaluation 01/13/23    PT Start Time 0950    Activity Tolerance Patient tolerated treatment well    Behavior During Therapy [facility] for tasks assessed/performed             Past Medical History:  Diagnosis Date   Arthritis    Breast cancer ([place])    Depression    H/O migraine    HSV-2 (herpes simplex virus 2) infection    Hyperlipidemia    Hypertension    Lichen sclerosus    Migraine    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   Vaginal atrophy 12/29/2007   Vaginitis and vulvovaginitis 12/28/2004   Past Surgical History:  Procedure Laterality Date   ABDOMINAL HYSTERECTOMY  1987   BREAST RECONSTRUCTION WITH PLACEMENT OF TISSUE EXPANDER AND FLEX HD (ACELLULAR HYDRATED DERMIS) Bilateral 10/20/2022   Procedure: BREAST RECONSTRUCTION WITH PLACEMENT OF TISSUE EXPANDER AND FLEX HD (ACELLULAR HYDRATED DERMIS);  Surgeon: Lennice Sites, MD;  Location: [place];  Service: Plastics;  Laterality: Bilateral;   DILATION AND CURETTAGE OF UTERUS     MASTECTOMY W/ SENTINEL NODE BIOPSY Right 10/20/2022   Procedure: RIGHT MASTECTOMY WITH RIGHT SENTINEL LYMPH NODE BIOPSY;  Surgeon: Stark Klein, MD;  Location: [place];  Service: General;  Laterality: Right;   TONSILLECTOMY     TOTAL MASTECTOMY Left 10/20/2022   Procedure: LEFT TOTAL MASTECTOMY;  Surgeon: Stark Klein, MD;  Location: [place];  Service: General;  Laterality: Left;   TUBAL LIGATION     WISDOM TOOTH EXTRACTION     Patient Active Problem List   Diagnosis Date Noted   Breast cancer ([place]) 10/20/2022   S/P breast reconstruction 10/20/2022   Breast cancer of upper-outer quadrant of right female breast ([place]) 10/20/2022   Genetic testing 08/24/2022   Family history of breast  cancer 08/06/2022   Family history of prostate cancer 08/06/2022   Malignant neoplasm of upper-outer quadrant of right breast in female, estrogen receptor positive ([place]) 07/31/2022   Abnormal glucose level 12/05/2020   Constipation 12/05/2020   Lumbago with sciatica 12/05/2020   Osteopenia 12/05/2020   Overweight 12/05/2020   Screening for malignant neoplasm of colon 12/05/2020   Vitamin D deficiency 12/05/2020   Essential hypertension 11/30/2019   LBBB (left bundle branch block) 11/30/2019   Exertional dyspnea 93/26/7124   Lichen sclerosus 58/08/9832   Mixed hyperlipidemia    HSV-2 (herpes simplex virus 2) infection    Depression    H/O migraine    Monilia infection    Vaginal atrophy    Wears glasses 06/23/2011   Sinus problem/runny nose 06/23/2011   Mass on back 06/23/2011    PCP: Willey Blade, MD  REFERRING PROVIDER: Nicholas Lose, MD  REFERRING DIAG: Right Breast Cancer  THERAPY DIAG:  Malignant neoplasm of upper-outer quadrant of right breast in female, estrogen receptor positive ([place])  Abnormal posture  Stiffness of left shoulder, not elsewhere classified  Stiffness of right shoulder, not elsewhere classified  Rationale for Evaluation and Treatment: Rehabilitation  ONSET DATE: 07/24/2022  SUBJECTIVE:  SUBJECTIVE STATEMENT:  The exercises are going OK. I am itching a lot at night and I see the Plastic Surgeon on Thursday.  I wonder if its the pill I am taking. It started shortly after that, but there is no rash. The expanders are still so heavy and sore.  PERTINENT HISTORY:  Patient was diagnosed on 07/24/2022 with right grade 2 Invasive Lobular Carcinoma. It measures 1.2 cm and is located in the upper-outer quadrant. It is ER 95%, PR 80% and HER 2 negative with a Ki67 of 40%.  She is s/p a Bilateral Mastectomy on 10/20/2022  for Right Breast Cancer and with left prophylactic and bilateral tissue expanders .  She has since decided that she wants the expanders removed and this will happen in January. Drains were removed on 11/23/2022,11/26/2022 and she has been told to continue compression and use vaseline over incisions. She has returned to work at home.  PATIENT GOALS:  Reassess how my recovery is going related to arm function, pain, and swelling.  PAIN:  Are you having pain? Yes: NPRS scale: 2/10 Pain location: bilateral breast from expanders Pain description: heaviness, achy and throbbing Aggravating factors: expanders Relieving factors: Tylenol  PRECAUTIONS: Recent Surgery, right UE Lymphedema risk, DDD, LBP  ACTIVITY LEVEL / LEISURE: nothing yet, returned to work   OBJECTIVE:   PATIENT SURVEYS:  QUICK DASH: 61.36  OBSERVATIONS: Pt with larger right breast  than secondary to expander filled more. Mastectomy incisions are healed with glue still present. Left axillary region with area of swelling/tissue greater than right. Pt will be having an [place] today on the left. Having expanders removed in early January.  POSTURE:  Forward head, rounded shoulders  LYMPHEDEMA ASSESSMENT:     UPPER EXTREMITY AROM/PROM:   A/PROM RIGHT   eval   Right 12/02/2022  Shoulder extension 60 49  Shoulder flexion 141 104  Shoulder abduction 168 68  Shoulder internal rotation 55   Shoulder external rotation 112                           (Blank rows = not tested)   A/PROM LEFT   eval LEFT 2023  Shoulder extension 58 46  Shoulder flexion 159 120  Shoulder abduction 165 90  Shoulder internal rotation 65   Shoulder external rotation 112                           (Blank rows = not tested)     CERVICAL AROM: All within functional  limits: Decreased 20 % Rotation and 50% SB         UPPER EXTREMITY STRENGTH: WNL     LYMPHEDEMA ASSESSMENTS:    LANDMARK RIGHT    eval Right 12/02/2022  10 cm proximal to olecranon process 28.5 28.7  Olecranon process 24.6 24.6  10 cm proximal to ulnar styloid process 21.8 21.7  Just proximal to ulnar styloid process 16.4 16.3  Across hand at thumb web space 18.3 18.2  At base of 2nd digit 6.5 6.6  (Blank rows = not tested)   LANDMARK LEFT   eval LEFT 12/02/2022  10 cm proximal to olecranon process 29.3 29.7  Olecranon process 24.9 24.7  10 cm proximal to ulnar styloid process 21.0 21.4  Just proximal to ulnar styloid process 16.3 16.3  Across hand at thumb web space 18.0 17.9  At base of 2nd digit 6.45 6.4  (Blank rows =  not tested)      Surgery type/Date: 10/20/2022 bilateral Mastectomy with right SLNB Number of lymph nodes removed: 0/2 right Current/past treatment (chemo, radiation, hormone therapy): No chemo required, will start anti-estrogen therapy Other symptoms:  Heaviness/tightness Yes Pain Yes Pitting edema No Infections No Decreased scar mobility Yes Stemmer sign No  TREATMENT TODAY  12/08/2022  Soft Tissue mobilization to bilateral UT, pectorals and Lats with cocoa butter in supine AAROM clasped hands flexion and stargazer x 5, wall slides x 5 PROM bilateral shoulder flexion, scaption, abd, IR and ER with multiple VC's to relax Tried supine wand x 2 but pt had trouble relaxing. Lower trunk rotation to the left x 4 to stretch lats/pecs gently   PATIENT EDUCATION:   Education details: Reviewed 4 post op exercises. Did better with abduction sliding hand on wall Person educated: Patient Education method: Explanation and Demonstration Education comprehension: returned demonstration    HOME EXERCISE PROGRAM: Reviewed previously given post op HEP. Changed wall walk to wall slide and pt demonstrated good improvement  ASSESSMENT:  CLINICAL IMPRESSION: Initiated soft tissue mobilization and PROM today and reviewed AAROM exercises.  Pt did very well on the left, but had difficulty  relaxing for PROM on the right and was actually able to go further herself on the right with PROM. Suggested pt message Dr. Lindi Adie about the itching she is experiencing and to try to get a refill on her muscle relaxer from plastic surgery.    Pt will benefit from skilled therapeutic intervention to improve on the following deficits: Decreased knowledge of precautions, impaired UE functional use, pain, decreased ROM, postural dysfunction.   PT treatment/interventions: ADL/Self care home management, Therapeutic exercises, Therapeutic activity, Neuromuscular re-education, Patient/Family education, Self Care, Joint mobilization, Orthotic/Fit training, Dry Needling, Manual lymph drainage, scar mobilization, Manual therapy, and Re-evaluation   GOALS: Goals reviewed with patient? Yes  LONG TERM GOALS:  (STG=LTG)  GOALS Name Target Date  Goal status  1 Pt will demonstrate she has regained full shoulder ROM and function post operatively compared to baselines.  Baseline: 01/13/2023 INITIAL  2 Pts quick dash will be improved to pre-surgery level of 36% 11/14/2023 INITIAL  3 Pt will be able to dress and bathe and perform light household chores independently 01/13/2023 INITIAL  4 Pt will have decreased pain by 50% overall 01/13/2023 INITIAL     PLAN:  PT FREQUENCY/DURATION: 2x/week x 6 weeks  PLAN FOR NEXT SESSION: STM bilateral UQ, AAROM, PROM bilateral shoulders, MLD prn especially to left axillary region   [facility]  [address], [address]  [place] [address]  [phone]  After Breast Cancer Class It is recommended you attend the ABC class to be educated on lymphedema risk reduction. This class is free of charge and lasts for 1 hour. It is a 1-time class. You will need to download the Webex app either on your phone or computer. We will send you a link the night before or the morning of the class. You should be able to click on that link to join the class. This is  not a confidential class. You don't have to turn your camera on, but other participants may be able to see your email address.  Scar massage You can begin gentle scar massage to you incision sites. Gently place one hand on the incision and move the skin (without sliding on the skin) in various directions. Do this for a few minutes and then you can gently massage either coconut  oil or vitamin E cream into the scars.  Compression garment You should continue wearing your compression bra until you feel like you no longer have swelling.  Home exercise Program Continue doing the exercises you were given until you feel like you can do them without feeling any tightness at the end.   Walking Program Studies show that 30 minutes of walking per day (fast enough to elevate your heart rate) can significantly reduce the risk of a cancer recurrence. If you can't walk due to other medical reasons, we encourage you to find another activity you could do (like a stationary bike or water exercise).  Posture After breast cancer surgery, people frequently sit with rounded shoulders posture because it puts their incisions on slack and feels better. If you sit like this and scar tissue forms in that position, you can become very tight and have pain sitting or standing with good posture. Try to be aware of your posture and sit and stand up tall to heal properly.  Follow up PT: It is recommended you return every 3 months for the first 3 years following surgery to be assessed on the SOZO machine for an L-Dex score. This helps prevent clinically significant lymphedema in 95% of patients. These follow up screens are 10 minute appointments that you are not billed for.  Claris Pong, PT 12/08/2022, 9:50 AM

## 2022-12-08 NOTE — Telephone Encounter (Signed)
Received call from pt with complaint of generalized pruritus after starting Anastrozole. Per MD pt needing to stop medication and f/u in one week.  Pt educated and verbalized understanding.

## 2022-12-10 ENCOUNTER — Ambulatory Visit (INDEPENDENT_AMBULATORY_CARE_PROVIDER_SITE_OTHER): Payer: 59 | Admitting: Student

## 2022-12-10 ENCOUNTER — Encounter: Payer: Self-pay | Admitting: Student

## 2022-12-10 ENCOUNTER — Ambulatory Visit: Payer: 59

## 2022-12-10 VITALS — BP 153/97 | HR 97

## 2022-12-10 DIAGNOSIS — C50411 Malignant neoplasm of upper-outer quadrant of right female breast: Secondary | ICD-10-CM | POA: Diagnosis not present

## 2022-12-10 DIAGNOSIS — M25612 Stiffness of left shoulder, not elsewhere classified: Secondary | ICD-10-CM

## 2022-12-10 DIAGNOSIS — C50911 Malignant neoplasm of unspecified site of right female breast: Secondary | ICD-10-CM

## 2022-12-10 DIAGNOSIS — Z170 Estrogen receptor positive status [ER+]: Secondary | ICD-10-CM

## 2022-12-10 DIAGNOSIS — R293 Abnormal posture: Secondary | ICD-10-CM

## 2022-12-10 DIAGNOSIS — M25611 Stiffness of right shoulder, not elsewhere classified: Secondary | ICD-10-CM

## 2022-12-10 MED ORDER — METHOCARBAMOL 500 MG PO TABS: 500.0000 mg | ORAL_TABLET | Freq: Three times a day (TID) | ORAL | 0 refills | Status: DC | PRN

## 2022-12-10 NOTE — H&P (View-Only) (Signed)
Patient is a 76 year old female with history of right breast cancer.  Patient underwent right mastectomy with sentinel node biopsy and left mastectomy with Dr. Barry Dienes followed by bilateral breast tissue expander placement with Dr. Erin Hearing on 10/20/2022.  Since surgery, patient is decided that she would like the expanders removed in early January.  Patient presents to the clinic for follow-up.     Patient was last seen in the clinic on 12/03/2022.  At this visit, she reported she was still having some pain in her bilateral breast, right greater than left.  She denied any infectious symptoms.  On exam, bilateral breast had no redness or warmth to the touch.  Incisions were clean dry and intact.  100 cc were removed from the left breast.  Patient felt relief.  Patient currently has 100 cc / 650 cc in each breast expander currently.  Today patient is doing well.  She states that the discomfort and heaviness to her breasts is much better.  She states she has noticed a big difference.  Patient does report she has started physical therapy and has a little bit of soreness related to that at times.  She denies any other issues or concerns.  She denies any fevers or chills.  Patient still would like her expanders removed in January.  Chaperone present on exam.  On exam, patient is sitting upright in no acute distress.  Expanders are in place bilaterally.  Incisions are clean dry and intact.  They are healing well.  There is no overlying erythema to either breast.  There is some swelling to the right breast, with a small amount of fluid palpated underneath the skin.  There is no overlying redness.  There are no significant fluid collections palpated to the left breast.  There are no signs of infection on exam.  We aspirated fluid from each expander using a sterile technique: Right: 50 cc for a total of 50/650 cc Left: 50 cc for a total of 50/650 cc   I attempted to aspirate fluid from over the port of the  expander to the right breast, but no fluid was able to be aspirated.  I discussed with the patient that she should continue compression at all times.  I discussed with the patient that she will need to closely monitor her right breast.  I discussed with her that if it becomes red, more swollen, if she develops any fevers or chills, or if she has any concerns with the right breast, she needs to follow-up with [place].  Patient expressed understanding.  I discussed with the patient that I will send her some Robaxin given some of the soreness she is having with physical therapy.  I discussed with the patient that she should take Tylenol or ibuprofen first if she is having pain.  I discussed with the patient that she is having a spasm in type sensation, she can take Robaxin.  I discussed with the patient to use caution with Robaxin as it can cause some drowsiness.  Patient expressed understanding.  Patient to follow-up next week.  Instructed the patient to call in the meantime if she has any questions or concerns.

## 2022-12-10 NOTE — Progress Notes (Signed)
Patient is a 76 year old female with history of right breast cancer.  Patient underwent right mastectomy with sentinel node biopsy and left mastectomy with Dr. Barry Dienes followed by bilateral breast tissue expander placement with Dr. Erin Hearing on 10/20/2022.  Since surgery, patient is decided that she would like the expanders removed in early January.  Patient presents to the clinic for follow-up.     Patient was last seen in the clinic on 12/03/2022.  At this visit, she reported she was still having some pain in her bilateral breast, right greater than left.  She denied any infectious symptoms.  On exam, bilateral breast had no redness or warmth to the touch.  Incisions were clean dry and intact.  100 cc were removed from the left breast.  Patient felt relief.  Patient currently has 100 cc / 650 cc in each breast expander currently.  Today patient is doing well.  She states that the discomfort and heaviness to her breasts is much better.  She states she has noticed a big difference.  Patient does report she has started physical therapy and has a little bit of soreness related to that at times.  She denies any other issues or concerns.  She denies any fevers or chills.  Patient still would like her expanders removed in January.  Chaperone present on exam.  On exam, patient is sitting upright in no acute distress.  Expanders are in place bilaterally.  Incisions are clean dry and intact.  They are healing well.  There is no overlying erythema to either breast.  There is some swelling to the right breast, with a small amount of fluid palpated underneath the skin.  There is no overlying redness.  There are no significant fluid collections palpated to the left breast.  There are no signs of infection on exam.  We aspirated fluid from each expander using a sterile technique: Right: 50 cc for a total of 50/650 cc Left: 50 cc for a total of 50/650 cc   I attempted to aspirate fluid from over the port of the  expander to the right breast, but no fluid was able to be aspirated.  I discussed with the patient that she should continue compression at all times.  I discussed with the patient that she will need to closely monitor her right breast.  I discussed with her that if it becomes red, more swollen, if she develops any fevers or chills, or if she has any concerns with the right breast, she needs to follow-up with [place].  Patient expressed understanding.  I discussed with the patient that I will send her some Robaxin given some of the soreness she is having with physical therapy.  I discussed with the patient that she should take Tylenol or ibuprofen first if she is having pain.  I discussed with the patient that she is having a spasm in type sensation, she can take Robaxin.  I discussed with the patient to use caution with Robaxin as it can cause some drowsiness.  Patient expressed understanding.  Patient to follow-up next week.  Instructed the patient to call in the meantime if she has any questions or concerns.

## 2022-12-10 NOTE — Therapy (Signed)
OUTPATIENT PHYSICAL THERAPY BREAST CANCER TREATMENT   Patient Name: Elizabeth Benton MRN: [id number] DOB:Jul 11, 1946, 76 y.o., female Today's Date: 12/10/2022  END OF SESSION:  PT End of Session - 12/10/22 1200     Visit Number 4    Number of Visits 14    Date for PT Re-Evaluation 01/13/23    PT Start Time 4098    PT Stop Time 1191    PT Time Calculation (min) 46 min    Activity Tolerance Patient tolerated treatment well    Behavior During Therapy [facility] for tasks assessed/performed             Past Medical History:  Diagnosis Date   Arthritis    Breast cancer ([place])    Depression    H/O migraine    HSV-2 (herpes simplex virus 2) infection    Hyperlipidemia    Hypertension    Lichen sclerosus    Migraine    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   Vaginal atrophy 12/29/2007   Vaginitis and vulvovaginitis 12/28/2004   Past Surgical History:  Procedure Laterality Date   ABDOMINAL HYSTERECTOMY  1987   BREAST RECONSTRUCTION WITH PLACEMENT OF TISSUE EXPANDER AND FLEX HD (ACELLULAR HYDRATED DERMIS) Bilateral 10/20/2022   Procedure: BREAST RECONSTRUCTION WITH PLACEMENT OF TISSUE EXPANDER AND FLEX HD (ACELLULAR HYDRATED DERMIS);  Surgeon: Lennice Sites, MD;  Location: [place];  Service: Plastics;  Laterality: Bilateral;   DILATION AND CURETTAGE OF UTERUS     MASTECTOMY W/ SENTINEL NODE BIOPSY Right 10/20/2022   Procedure: RIGHT MASTECTOMY WITH RIGHT SENTINEL LYMPH NODE BIOPSY;  Surgeon: Stark Klein, MD;  Location: [place];  Service: General;  Laterality: Right;   TONSILLECTOMY     TOTAL MASTECTOMY Left 10/20/2022   Procedure: LEFT TOTAL MASTECTOMY;  Surgeon: Stark Klein, MD;  Location: [place];  Service: General;  Laterality: Left;   TUBAL LIGATION     WISDOM TOOTH EXTRACTION     Patient Active Problem List   Diagnosis Date Noted   Breast cancer ([place]) 10/20/2022   S/P breast reconstruction 10/20/2022   Breast cancer of upper-outer quadrant of right female breast ([place]) 10/20/2022    Genetic testing 08/24/2022   Family history of breast cancer 08/06/2022   Family history of prostate cancer 08/06/2022   Malignant neoplasm of upper-outer quadrant of right breast in female, estrogen receptor positive ([place]) 07/31/2022   Abnormal glucose level 12/05/2020   Constipation 12/05/2020   Lumbago with sciatica 12/05/2020   Osteopenia 12/05/2020   Overweight 12/05/2020   Screening for malignant neoplasm of colon 12/05/2020   Vitamin D deficiency 12/05/2020   Essential hypertension 11/30/2019   LBBB (left bundle branch block) 11/30/2019   Exertional dyspnea 47/82/9562   Lichen sclerosus 13/07/6577   Mixed hyperlipidemia    HSV-2 (herpes simplex virus 2) infection    Depression    H/O migraine    Monilia infection    Vaginal atrophy    Wears glasses 06/23/2011   Sinus problem/runny nose 06/23/2011   Mass on back 06/23/2011    PCP: Willey Blade, MD  REFERRING PROVIDER: Nicholas Lose, MD  REFERRING DIAG: Right Breast Cancer  THERAPY DIAG:  Malignant neoplasm of upper-outer quadrant of right breast in female, estrogen receptor positive ([place])  Abnormal posture  Stiffness of left shoulder, not elsewhere classified  Stiffness of right shoulder, not elsewhere classified  Rationale for Evaluation and Treatment: Rehabilitation  ONSET DATE: 07/24/2022  SUBJECTIVE:  SUBJECTIVE STATEMENT:  It was the medication making me itch so I stopped it and I have stopped itching. I am going to ask about the muscle relaxer today when I go to the Plastic surgeon. I think I am doing better with the exercises. I was sore after last visit, but then I felt better PERTINENT HISTORY:  Patient was diagnosed on 07/24/2022 with right grade 2 Invasive Lobular Carcinoma. It measures 1.2 cm and is located in the  upper-outer quadrant. It is ER 95%, PR 80% and HER 2 negative with a Ki67 of 40%. She is s/p a Bilateral Mastectomy on 10/20/2022  for Right Breast Cancer and with left prophylactic and bilateral tissue expanders .  She has since decided that she wants the expanders removed and this will happen in January. Drains were removed on 11/23/2022,11/26/2022 and she has been told to continue compression and use vaseline over incisions. She has returned to work at home.  PATIENT GOALS:  Reassess how my recovery is going related to arm function, pain, and swelling.  PAIN:  Are you having pain? Yes: NPRS scale: 2/10 Pain location: bilateral breast from expanders Pain description: heaviness, achy and throbbing Aggravating factors: expanders Relieving factors: Tylenol  PRECAUTIONS: Recent Surgery, right UE Lymphedema risk, DDD, LBP  ACTIVITY LEVEL / LEISURE: nothing yet, returned to work   OBJECTIVE:   PATIENT SURVEYS:  QUICK DASH: 61.36  OBSERVATIONS: Pt with larger right breast  than secondary to expander filled more. Mastectomy incisions are healed with glue still present. Left axillary region with area of swelling/tissue greater than right. Pt will be having an [place] today on the left. Having expanders removed in early January.  POSTURE:  Forward head, rounded shoulders  LYMPHEDEMA ASSESSMENT:     UPPER EXTREMITY AROM/PROM:   A/PROM RIGHT   eval   Right 12/02/2022  Shoulder extension 60 49  Shoulder flexion 141 104  Shoulder abduction 168 68  Shoulder internal rotation 55   Shoulder external rotation 112                           (Blank rows = not tested)   A/PROM LEFT   eval LEFT 2023  Shoulder extension 58 46  Shoulder flexion 159 120  Shoulder abduction 165 90  Shoulder internal rotation 65   Shoulder external rotation 112                           (Blank rows = not tested)     CERVICAL AROM: All within functional  limits: Decreased 20 % Rotation and 50% SB          UPPER EXTREMITY STRENGTH: WNL     LYMPHEDEMA ASSESSMENTS:    LANDMARK RIGHT   eval Right 12/02/2022  10 cm proximal to olecranon process 28.5 28.7  Olecranon process 24.6 24.6  10 cm proximal to ulnar styloid process 21.8 21.7  Just proximal to ulnar styloid process 16.4 16.3  Across hand at thumb web space 18.3 18.2  At base of 2nd digit 6.5 6.6  (Blank rows = not tested)   LANDMARK LEFT   eval LEFT 12/02/2022  10 cm proximal to olecranon process 29.3 29.7  Olecranon process 24.9 24.7  10 cm proximal to ulnar styloid process 21.0 21.4  Just proximal to ulnar styloid process 16.3 16.3  Across hand at thumb web space 18.0 17.9  At base of 2nd digit 6.45 6.4  (  Blank rows = not tested)      Surgery type/Date: 10/20/2022 bilateral Mastectomy with right SLNB Number of lymph nodes removed: 0/2 right Current/past treatment (chemo, radiation, hormone therapy): No chemo required, will start anti-estrogen therapy Other symptoms:  Heaviness/tightness Yes Pain Yes Pitting edema No Infections No Decreased scar mobility Yes Stemmer sign No  TREATMENT TODAY  12/10/2022  Soft Tissue mobilization to bilateral UT, pectorals and Lats with cocoa butter in supine PROM bilateral shoulder flexion, scaption, abd, IR and ER with multiple VC's to relax Supine AROM bilateral shoulder flexion,scaption, horizontal abd in comfortable ROM x 5 ea  12/08/2022 Soft Tissue mobilization to bilateral UT, pectorals and Lats with cocoa butter in supine AAROM clasped hands flexion and stargazer x 5, wall slides x 5 PROM bilateral shoulder flexion, scaption, abd, IR and ER with multiple VC's to relax Tried supine wand x 2 but pt had trouble relaxing. Lower trunk rotation to the left x 4 to stretch lats/pecs gently   PATIENT EDUCATION:   Education details: Reviewed 4 post op exercises. Did better with abduction sliding hand on wall Person educated: Patient Education method: Explanation and  Demonstration Education comprehension: returned demonstration    HOME EXERCISE PROGRAM: Reviewed previously given post op HEP. Changed wall walk to wall slide and pt demonstrated good improvement  ASSESSMENT:  CLINICAL IMPRESSION:  Pt relaxed much better today (she took a muscle relaxer before coming) during therapy. Good improvement in PROM with fewer verbal cues to relax. Also able to do AROM bilaterally but in smaller ROM  Pt will benefit from skilled therapeutic intervention to improve on the following deficits: Decreased knowledge of precautions, impaired UE functional use, pain, decreased ROM, postural dysfunction.   PT treatment/interventions: ADL/Self care home management, Therapeutic exercises, Therapeutic activity, Neuromuscular re-education, Patient/Family education, Self Care, Joint mobilization, Orthotic/Fit training, Dry Needling, Manual lymph drainage, scar mobilization, Manual therapy, and Re-evaluation   GOALS: Goals reviewed with patient? Yes  LONG TERM GOALS:  (STG=LTG)  GOALS Name Target Date  Goal status  1 Pt will demonstrate she has regained full shoulder ROM and function post operatively compared to baselines.  Baseline: 01/13/2023 INITIAL  2 Pts quick dash will be improved to pre-surgery level of 36% 11/14/2023 INITIAL  3 Pt will be able to dress and bathe and perform light household chores independently 01/13/2023 INITIAL  4 Pt will have decreased pain by 50% overall 01/13/2023 INITIAL     PLAN:  PT FREQUENCY/DURATION: 2x/week x 6 weeks  PLAN FOR NEXT SESSION: STM bilateral UQ, AAROM, PROM bilateral shoulders, MLD prn especially to left axillary region   [facility]  [address], [address]  [place] [address]  [phone]  After Breast Cancer Class It is recommended you attend the ABC class to be educated on lymphedema risk reduction. This class is free of charge and lasts for 1 hour. It is a 1-time class. You will need  to download the Webex app either on your phone or computer. We will send you a link the night before or the morning of the class. You should be able to click on that link to join the class. This is not a confidential class. You don't have to turn your camera on, but other participants may be able to see your email address.  Scar massage You can begin gentle scar massage to you incision sites. Gently place one hand on the incision and move the skin (without sliding on the skin) in various directions. Do  this for a few minutes and then you can gently massage either coconut oil or vitamin E cream into the scars.  Compression garment You should continue wearing your compression bra until you feel like you no longer have swelling.  Home exercise Program Continue doing the exercises you were given until you feel like you can do them without feeling any tightness at the end.   Walking Program Studies show that 30 minutes of walking per day (fast enough to elevate your heart rate) can significantly reduce the risk of a cancer recurrence. If you can't walk due to other medical reasons, we encourage you to find another activity you could do (like a stationary bike or water exercise).  Posture After breast cancer surgery, people frequently sit with rounded shoulders posture because it puts their incisions on slack and feels better. If you sit like this and scar tissue forms in that position, you can become very tight and have pain sitting or standing with good posture. Try to be aware of your posture and sit and stand up tall to heal properly.  Follow up PT: It is recommended you return every 3 months for the first 3 years following surgery to be assessed on the SOZO machine for an L-Dex score. This helps prevent clinically significant lymphedema in 95% of patients. These follow up screens are 10 minute appointments that you are not billed for.  Claris Pong, PT 12/10/2022, 12:48 PM

## 2022-12-11 ENCOUNTER — Telehealth: Payer: Self-pay | Admitting: *Deleted

## 2022-12-11 NOTE — Telephone Encounter (Signed)
Mychart message sent offering the following sx dates:   12/29/2022  01/08/2023  01/11/2023

## 2022-12-14 ENCOUNTER — Other Ambulatory Visit: Payer: Medicare Other

## 2022-12-14 ENCOUNTER — Inpatient Hospital Stay (HOSPITAL_BASED_OUTPATIENT_CLINIC_OR_DEPARTMENT_OTHER): Payer: 59 | Admitting: Adult Health

## 2022-12-14 ENCOUNTER — Encounter: Payer: Self-pay | Admitting: Adult Health

## 2022-12-14 ENCOUNTER — Inpatient Hospital Stay: Payer: 59 | Attending: Hematology and Oncology | Admitting: Adult Health

## 2022-12-14 ENCOUNTER — Other Ambulatory Visit: Payer: Self-pay

## 2022-12-14 VITALS — BP 149/78 | HR 87 | Temp 98.1°F | Resp 20 | Wt 137.0 lb

## 2022-12-14 DIAGNOSIS — Z801 Family history of malignant neoplasm of trachea, bronchus and lung: Secondary | ICD-10-CM | POA: Diagnosis not present

## 2022-12-14 DIAGNOSIS — Z803 Family history of malignant neoplasm of breast: Secondary | ICD-10-CM | POA: Diagnosis not present

## 2022-12-14 DIAGNOSIS — C50411 Malignant neoplasm of upper-outer quadrant of right female breast: Secondary | ICD-10-CM | POA: Diagnosis present

## 2022-12-14 DIAGNOSIS — Z170 Estrogen receptor positive status [ER+]: Secondary | ICD-10-CM | POA: Insufficient documentation

## 2022-12-14 DIAGNOSIS — Z79811 Long term (current) use of aromatase inhibitors: Secondary | ICD-10-CM | POA: Diagnosis not present

## 2022-12-14 DIAGNOSIS — Z8042 Family history of malignant neoplasm of prostate: Secondary | ICD-10-CM | POA: Insufficient documentation

## 2022-12-14 DIAGNOSIS — Z87891 Personal history of nicotine dependence: Secondary | ICD-10-CM | POA: Insufficient documentation

## 2022-12-14 DIAGNOSIS — Z9012 Acquired absence of left breast and nipple: Secondary | ICD-10-CM | POA: Insufficient documentation

## 2022-12-14 MED ORDER — LETROZOLE 2.5 MG PO TABS: 2.5000 mg | ORAL_TABLET | Freq: Every day | ORAL | 0 refills | Status: DC

## 2022-12-14 NOTE — Progress Notes (Signed)
[place] [facility] Follow up:    [address]ade, [place] [address]   DIAGNOSIS:  Cancer Staging  Malignant neoplasm of upper-outer quadrant of right breast in female, estrogen receptor positive ([place]) Staging form: Breast, AJCC 8th Edition - Clinical stage from 08/05/2022: Stage IA (cT1c, cN0, cM0, G2, ER+, PR+, HER2-) - Signed by Nicholas Lose, MD on 08/05/2022 Stage prefix: Initial diagnosis Histologic grading system: 3 grade system   SUMMARY OF ONCOLOGIC HISTORY: Oncology History  Malignant neoplasm of upper-outer quadrant of right breast in female, estrogen receptor positive ([place])  07/24/2022 Initial Diagnosis   Screening mammogram detected right breast asymmetry by ultrasound measured 1.2 cm at 7 o'clock position.  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  Left breast biopsy came back benign.   08/05/2022 Cancer Staging   Staging form: Breast, AJCC 8th Edition - Clinical stage from 08/05/2022: Stage IA (cT1c, cN0, cM0, G2, ER+, PR+, HER2-) - Signed by Nicholas Lose, MD on 08/05/2022 Stage prefix: Initial diagnosis Histologic grading system: 3 grade system    Genetic Testing   [facility] CancerNext-Expanded Panel was Negative. Report date is 08/20/2022.  The CancerNext-Expanded gene panel offered by [facility] and includes sequencing, rearrangement, and RNA analysis for the following 77 genes: AIP, ALK, APC, ATM, AXIN2, BAP1, BARD1, BLM, BMPR1A, BRCA1, BRCA2, BRIP1, CDC73, CDH1, CDK4, CDKN1B, CDKN2A, CHEK2, CTNNA1, DICER1, FANCC, FH, FLCN, GALNT12, KIF1B, LZTR1, MAX, MEN1, MET, MLH1, MSH2, MSH3, MSH6, MUTYH, NBN, NF1, NF2, NTHL1, PALB2, PHOX2B, PMS2, POT1, PRKAR1A, PTCH1, PTEN, RAD51C, RAD51D, RB1, RECQL, RET, SDHA, SDHAF2, SDHB, SDHC, SDHD, SMAD4, SMARCA4, SMARCB1, SMARCE1, STK11, SUFU, TMEM127, TP53, TSC1, TSC2, VHL and XRCC2 (sequencing and deletion/duplication); EGFR, EGLN1, HOXB13, KIT,  MITF, PDGFRA, POLD1, and POLE (sequencing only); EPCAM and GREM1 (deletion/duplication only).    10/20/2022 Surgery   Left mastectomy: Benign, 2 benign lymph nodes Right mastectomy: Grade 3 ILC 2.1 cm, LCIS, margins negative, 0/4 lymph nodes, ER 95%, PR 80%, HER2 negative 1+, Ki-67 40%     CURRENT THERAPY:  Anastrozole  INTERVAL HISTORY: [name] 76 y.o. female returns for f/u after developing side effects of itching once taking Anastrozole.  She has developed itching all over her body.  She called our office and was instructed to stop the anastrozole.  Since [name] stopped taking the anastrozole the itching stopped.      Patient Active Problem List   Diagnosis Date Noted   Breast cancer ([place]) 10/20/2022   S/P breast reconstruction 10/20/2022   Breast cancer of upper-outer quadrant of right female breast ([place]) 10/20/2022   Genetic testing 08/24/2022   Family history of breast cancer 08/06/2022   Family history of prostate cancer 08/06/2022   Malignant neoplasm of upper-outer quadrant of right breast in female, estrogen receptor positive ([place]) 07/31/2022   Abnormal glucose level 12/05/2020   Constipation 12/05/2020   Lumbago with sciatica 12/05/2020   Osteopenia 12/05/2020   Overweight 12/05/2020   Screening for malignant neoplasm of colon 12/05/2020   Vitamin D deficiency 12/05/2020   Essential hypertension 11/30/2019   LBBB (left bundle branch block) 11/30/2019   Exertional dyspnea 60/45/4098   Lichen sclerosus 11/91/4782   Mixed hyperlipidemia    HSV-2 (herpes simplex virus 2) infection    Depression    H/O migraine    Monilia infection    Vaginal atrophy    Wears glasses 06/23/2011   Sinus problem/runny nose 06/23/2011   Mass  on back 06/23/2011    is allergic to contrast media [iodinated contrast media], demerol, iodine, and anastrozole.  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   Vaginal atrophy 12/29/2007   Vaginitis and vulvovaginitis 12/28/2004    SURGICAL HISTORY: Past Surgical History:  Procedure Laterality Date   ABDOMINAL HYSTERECTOMY  1987   BREAST RECONSTRUCTION WITH PLACEMENT OF TISSUE EXPANDER AND FLEX HD (ACELLULAR HYDRATED DERMIS) Bilateral 10/20/2022   Procedure: BREAST RECONSTRUCTION WITH PLACEMENT OF TISSUE EXPANDER AND FLEX HD (ACELLULAR HYDRATED DERMIS);  Surgeon: Lennice Sites, MD;  Location: [place];  Service: Plastics;  Laterality: Bilateral;   DILATION AND CURETTAGE OF UTERUS     MASTECTOMY W/ SENTINEL NODE BIOPSY Right 10/20/2022   Procedure: RIGHT MASTECTOMY WITH RIGHT SENTINEL LYMPH NODE BIOPSY;  Surgeon: Stark Klein, MD;  Location: [place];  Service: General;  Laterality: Right;   TONSILLECTOMY     TOTAL MASTECTOMY Left 10/20/2022   Procedure: LEFT TOTAL MASTECTOMY;  Surgeon: Stark Klein, MD;  Location: [place];  Service: General;  Laterality: Left;   TUBAL LIGATION     WISDOM TOOTH EXTRACTION      SOCIAL HISTORY: Social History   Socioeconomic History   Marital status: Married    Spouse name: Teyona Nichelson   Number of children: 2   Years of education: Not on file   Highest education level: Not on file  Occupational History   Not on file  Tobacco Use   Smoking status: Former    Packs/day: 0.50    Years: 40.00    Total pack years: 20.00    Types: Cigarettes    Quit date: 01/22/1997    Years since quitting: 25.9   Smokeless tobacco: Never  Vaping Use   Vaping Use: Never used  Substance and Sexual Activity   Alcohol use: Yes    Alcohol/week: 1.0 standard drink of alcohol    Types: 1 Standard drinks or equivalent per week    Comment: occ   Drug use: No   Sexual activity: Not Currently    Birth control/protection: Post-menopausal    Comment: hyst  Other Topics  Concern   Not on file  Social History Narrative   Not on file   Social Determinants of Health   Financial Resource Strain: Not on file  Food Insecurity: Not on file  Transportation Needs: Not on file  Physical Activity: Not on file  Stress: Not on file  Social Connections: Not on file  Intimate Partner Violence: Not on file    FAMILY HISTORY: Family History  Problem Relation Age of Onset   Stroke Mother    Throat cancer Father 66   Prostate cancer Brother 20   Breast cancer Maternal Aunt 65   Prostate cancer Maternal Uncle    Prostate cancer Maternal Uncle    Prostate cancer Maternal Uncle    Pancreatic cancer Paternal Uncle    Breast cancer Cousin        4 maternal first cousins   Prostate cancer Cousin    Breast cancer Other        maternal first cousin once removed   Other Other        BRCA+    Review  of Systems  Constitutional:  Negative for appetite change, chills, fatigue, fever and unexpected weight change.  HENT:   Negative for hearing loss, lump/mass and trouble swallowing.   Eyes:  Negative for eye problems and icterus.  Respiratory:  Negative for chest tightness, cough and shortness of breath.   Cardiovascular:  Negative for chest pain, leg swelling and palpitations.  Gastrointestinal:  Negative for abdominal distention, abdominal pain, constipation, diarrhea, nausea and vomiting.  Endocrine: Negative for hot flashes.  Genitourinary:  Negative for difficulty urinating.   Musculoskeletal:  Negative for arthralgias.  Skin:  Negative for itching and rash.  Neurological:  Negative for dizziness, extremity weakness, headaches and numbness.  Hematological:  Negative for adenopathy. Does not bruise/bleed easily.  Psychiatric/Behavioral:  Negative for depression. The patient is not nervous/anxious.       PHYSICAL EXAMINATION  ECOG PERFORMANCE STATUS: 1 - Symptomatic but completely ambulatory  Vitals:   12/14/22 1202  BP: (!) 149/78  Pulse: 87  Resp: 20   Temp: 98.1 F (36.7 C)  SpO2: 98%    Physical Exam Constitutional:      General: She is not in acute distress.    Appearance: Normal appearance. She is not toxic-appearing.  HENT:     Head: Normocephalic and atraumatic.  Eyes:     General: No scleral icterus. Cardiovascular:     Rate and Rhythm: Normal rate and regular rhythm.     Pulses: Normal pulses.     Heart sounds: Normal heart sounds.  Pulmonary:     Effort: Pulmonary effort is normal.     Breath sounds: Normal breath sounds.  Abdominal:     General: Abdomen is flat. Bowel sounds are normal. There is no distension.     Palpations: Abdomen is soft.     Tenderness: There is no abdominal tenderness.  Musculoskeletal:        General: No swelling.     Cervical back: Neck supple.  Lymphadenopathy:     Cervical: No cervical adenopathy.  Skin:    General: Skin is warm and dry.     Findings: No rash.  Neurological:     General: No focal deficit present.     Mental Status: She is alert.  Psychiatric:        Mood and Affect: Mood normal.        Behavior: Behavior normal.     LABORATORY DATA: None for this visit    ASSESSMENT and THERAPY PLAN:   Malignant neoplasm of upper-outer quadrant of right breast in female, estrogen receptor positive ([place]) Elizabeth Benton is a 75 year old woman with stage IA ER/PR positive breast cancer diagnosed in 07/2022 s/p bilateral mastectomies and began on Anastrozole in 10/2022.    She has developed itching likely secondary to Anastrozole.  I recommended that she discontinue Anastrozole.  I changed her antiestrogen therapy to Letrozole daily. We discussed that if she develops itching with the Letrozole, then we will start Tamoxifen.    Tamoxifen blocks estrogens from reaching breast cells, which is differently than how the aromatase inhibitors work.  She will call me if she has any difficulty with the Letrozole.   We will see [name] back in 01/2023 for her survivorshipo care plan visit.    All  questions were answered. The patient knows to call the clinic with any problems, questions or concerns. We can certainly see the patient much sooner if necessary.  Total encounter time:20 minutes*in face-to-face visit time, chart review, lab review, care coordination, order entry,  and documentation of the encounter time.    Wilber Bihari, NP 12/14/22 9:02 PM Medical Oncology and Hematology [facility] [place], [place] [address] Tel. [phone]    Fax. [phone]  *Total Encounter Time as defined by the Centers for Medicare and Medicaid Services includes, in addition to the face-to-face time of a patient visit (documented in the note above) non-face-to-face time: obtaining and reviewing outside history, ordering and reviewing medications, tests or procedures, care coordination (communications with other health care professionals or caregivers) and documentation in the medical record.

## 2022-12-14 NOTE — Assessment & Plan Note (Signed)
Elizabeth Benton is a 76 year old woman with stage IA ER/PR positive breast cancer diagnosed in 07/2022 s/p bilateral mastectomies and began on Anastrozole in 10/2022.    She has developed itching likely secondary to Anastrozole.  I recommended that she discontinue Anastrozole.  I changed her antiestrogen therapy to Letrozole daily. We discussed that if she develops itching with the Letrozole, then we will start Tamoxifen.    Tamoxifen blocks estrogens from reaching breast cells, which is differently than how the aromatase inhibitors work.  She will call me if she has any difficulty with the Letrozole.   We will see [name] back in 01/2023 for her survivorshipo care plan visit.

## 2022-12-15 ENCOUNTER — Ambulatory Visit: Payer: 59

## 2022-12-15 DIAGNOSIS — C50411 Malignant neoplasm of upper-outer quadrant of right female breast: Secondary | ICD-10-CM | POA: Diagnosis not present

## 2022-12-15 DIAGNOSIS — M25612 Stiffness of left shoulder, not elsewhere classified: Secondary | ICD-10-CM

## 2022-12-15 DIAGNOSIS — R293 Abnormal posture: Secondary | ICD-10-CM

## 2022-12-15 DIAGNOSIS — M25611 Stiffness of right shoulder, not elsewhere classified: Secondary | ICD-10-CM

## 2022-12-15 DIAGNOSIS — Z170 Estrogen receptor positive status [ER+]: Secondary | ICD-10-CM

## 2022-12-15 NOTE — Therapy (Signed)
OUTPATIENT PHYSICAL THERAPY BREAST CANCER TREATMENT   Patient Name: Elizabeth Benton MRN: [id number] DOB:[birth year], 76 y.o., female Today's Date: 12/15/2022  END OF SESSION:  PT End of Session - 12/15/22 0959     Visit Number 5    Number of Visits 14    Date for PT Re-Evaluation 01/13/23    PT Start Time 1001    PT Stop Time 1050    PT Time Calculation (min) 49 min    Activity Tolerance Patient tolerated treatment well    Behavior During Therapy [facility] for tasks assessed/performed             Past Medical History:  Diagnosis Date   Arthritis    Breast cancer ([place])    Depression    H/O migraine    HSV-2 (herpes simplex virus 2) infection    Hyperlipidemia    Hypertension    Lichen sclerosus    Migraine    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   Vaginal atrophy 12/29/2007   Vaginitis and vulvovaginitis 12/28/2004   Past Surgical History:  Procedure Laterality Date   ABDOMINAL HYSTERECTOMY  1987   BREAST RECONSTRUCTION WITH PLACEMENT OF TISSUE EXPANDER AND FLEX HD (ACELLULAR HYDRATED DERMIS) Bilateral 10/20/2022   Procedure: BREAST RECONSTRUCTION WITH PLACEMENT OF TISSUE EXPANDER AND FLEX HD (ACELLULAR HYDRATED DERMIS);  Surgeon: Lennice Sites, MD;  Location: [facility];  Service: Plastics;  Laterality: Bilateral;   DILATION AND CURETTAGE OF UTERUS     MASTECTOMY W/ SENTINEL NODE BIOPSY Right 10/20/2022   Procedure: RIGHT MASTECTOMY WITH RIGHT SENTINEL LYMPH NODE BIOPSY;  Surgeon: Stark Klein, MD;  Location: [facility];  Service: General;  Laterality: Right;   TONSILLECTOMY     TOTAL MASTECTOMY Left 10/20/2022   Procedure: LEFT TOTAL MASTECTOMY;  Surgeon: Stark Klein, MD;  Location: [facility];  Service: General;  Laterality: Left;   TUBAL LIGATION     WISDOM TOOTH EXTRACTION     Patient Active Problem List   Diagnosis Date Noted   Breast cancer ([place]) 10/20/2022   S/P breast reconstruction 10/20/2022   Breast cancer of upper-outer quadrant of right female breast ([place]) 10/20/2022    Genetic testing 08/24/2022   Family history of breast cancer 08/06/2022   Family history of prostate cancer 08/06/2022   Malignant neoplasm of upper-outer quadrant of right breast in female, estrogen receptor positive ([place]) 07/31/2022   Abnormal glucose level 12/05/2020   Constipation 12/05/2020   Lumbago with sciatica 12/05/2020   Osteopenia 12/05/2020   Overweight 12/05/2020   Screening for malignant neoplasm of colon 12/05/2020   Vitamin D deficiency 12/05/2020   Essential hypertension 11/30/2019   LBBB (left bundle branch block) 11/30/2019   Exertional dyspnea 82/70/7867   Lichen sclerosus 54/49/2010   Mixed hyperlipidemia    HSV-2 (herpes simplex virus 2) infection    Depression    H/O migraine    Monilia infection    Vaginal atrophy    Wears glasses 06/23/2011   Sinus problem/runny nose 06/23/2011   Mass on back 06/23/2011    PCP: Willey Blade, MD  REFERRING PROVIDER: Nicholas Lose, MD  REFERRING DIAG: Right Breast Cancer  THERAPY DIAG:  Malignant neoplasm of upper-outer quadrant of right breast in female, estrogen receptor positive ([place])  Abnormal posture  Stiffness of left shoulder, not elsewhere classified  Stiffness of right shoulder, not elsewhere classified  Rationale for Evaluation and Treatment: Rehabilitation  ONSET DATE: 07/24/2022  SUBJECTIVE:  SUBJECTIVE STATEMENT: The itching is gone. I pick up my new medicine today (Letrozole). I am very swollen above the expander and they couldn't draw it out. Projected date to remove is January 15. I haven't been able to do my exercises as well but I am trying. The swelling is really bothering me. I got a prescription for Muscle relaxers but [facility] is out of it.   PERTINENT HISTORY:  Patient was diagnosed on 07/24/2022 with  right grade 2 Invasive Lobular Carcinoma. It measures 1.2 cm and is located in the upper-outer quadrant. It is ER 95%, PR 80% and HER 2 negative with a Ki67 of 40%. She is s/p a Bilateral Mastectomy on 10/20/2022  for Right Breast Cancer and with left prophylactic and bilateral tissue expanders .  She has since decided that she wants the expanders removed and this will happen in January. Drains were removed on 11/23/2022,11/26/2022 and she has been told to continue compression and use vaseline over incisions. She has returned to work at home.  PATIENT GOALS:  Reassess how my recovery is going related to arm function, pain, and swelling.  PAIN:  Are you having pain?  PAIN:  Are you having pain? Yes NPRS scale: 2/10 to 6/10 Pain location: right breast from expander, new swelling Pain orientation: Right  PAIN TYPE: tight and heavy Pain description: constant  Aggravating factors: expander Relieving factors: pain meds help some   PRECAUTIONS: Recent Surgery, right UE Lymphedema risk, DDD, LBP  ACTIVITY LEVEL / LEISURE: nothing yet, returned to work   OBJECTIVE:   PATIENT SURVEYS:  QUICK DASH: 61.36  OBSERVATIONS: Pt with larger right breast  than secondary to expander filled more. Mastectomy incisions are healed with glue still present. Left axillary region with area of swelling/tissue greater than right. Pt will be having an [place] today on the left. Having expanders removed in early January.  POSTURE:  Forward head, rounded shoulders  LYMPHEDEMA ASSESSMENT:     UPPER EXTREMITY AROM/PROM:   A/PROM RIGHT   eval   Right 12/02/2022  Shoulder extension 60 49  Shoulder flexion 141 104  Shoulder abduction 168 68  Shoulder internal rotation 55   Shoulder external rotation 112                           (Blank rows = not tested)   A/PROM LEFT   eval LEFT 2023  Shoulder extension 58 46  Shoulder flexion 159 120  Shoulder abduction 165 90  Shoulder internal rotation 65   Shoulder  external rotation 112                           (Blank rows = not tested)     CERVICAL AROM: All within functional  limits: Decreased 20 % Rotation and 50% SB         UPPER EXTREMITY STRENGTH: WNL     LYMPHEDEMA ASSESSMENTS:    LANDMARK RIGHT   eval Right 12/02/2022  10 cm proximal to olecranon process 28.5 28.7  Olecranon process 24.6 24.6  10 cm proximal to ulnar styloid process 21.8 21.7  Just proximal to ulnar styloid process 16.4 16.3  Across hand at thumb web space 18.3 18.2  At base of 2nd digit 6.5 6.6  (Blank rows = not tested)   LANDMARK LEFT   eval LEFT 12/02/2022  10 cm proximal to olecranon process 29.3 29.7  Olecranon process 24.9 24.7  10 cm proximal to ulnar styloid process 21.0 21.4  Just proximal to ulnar styloid process 16.3 16.3  Across hand at thumb web space 18.0 17.9  At base of 2nd digit 6.45 6.4  (Blank rows = not tested)      Surgery type/Date: 10/20/2022 bilateral Mastectomy with right SLNB Number of lymph nodes removed: 0/2 right Current/past treatment (chemo, radiation, hormone therapy): No chemo required, will start anti-estrogen therapy Other symptoms:  Heaviness/tightness Yes Pain Yes Pitting edema No Infections No Decreased scar mobility Yes Stemmer sign No  TREATMENT TODAY  12/15/2022  Soft Tissue mobilization to bilateral UT, pectorals and Lats with cocoa butter in supine PROM bilateral shoulder flexion, scaption, abd, IR and ER with fewer VC's to relax Supine AROM bilateral shoulder flexion,scaption, horizontal abd in comfortable ROM x 5 ea In supine: Short neck, 5 diaphragmatic breaths, L axillary nodes and establishment of interaxillary pathway, R inguinal nodes and establishment of axilloinguinal pathway, then R breast moving fluid towards pathways  then retracing all steps and ending with LN's Assisted pt up from table.    12/10/2022 Soft Tissue mobilization to bilateral UT, pectorals and Lats with cocoa butter in  supine PROM bilateral shoulder flexion, scaption, abd, IR and ER with multiple VC's to relax Supine AROM bilateral shoulder flexion,scaption, horizontal abd in comfortable ROM x 5 ea  12/08/2022 Soft Tissue mobilization to bilateral UT, pectorals and Lats with cocoa butter in supine AAROM clasped hands flexion and stargazer x 5, wall slides x 5 PROM bilateral shoulder flexion, scaption, abd, IR and ER with multiple VC's to relax Tried supine wand x 2 but pt had trouble relaxing. Lower trunk rotation to the left x 4 to stretch lats/pecs gently   PATIENT EDUCATION:   Education details: Reviewed 4 post op exercises. Did better with abduction sliding hand on wall Person educated: Patient Education method: Explanation and Demonstration Education comprehension: returned demonstration    HOME EXERCISE PROGRAM: Reviewed previously given post op HEP. Changed wall walk to wall slide and pt demonstrated good improvement  ASSESSMENT:  CLINICAL IMPRESSION: Pt having much more pain today in right breast from expander and increased swelling. Pain increased after seeing MD last week. There are no signs of infection. She relaxed better for ROM today, and after performing MLD she did report that she felt better. She has an appt on Thursday with Plastics. She was advised to call them ASAP if she sees any signs of infection. Left shoulder ROM greatly improved. Right doing much better but with more limitation than left.  Pt will benefit from skilled therapeutic intervention to improve on the following deficits: Decreased knowledge of precautions, impaired UE functional use, pain, decreased ROM, postural dysfunction.   PT treatment/interventions: ADL/Self care home management, Therapeutic exercises, Therapeutic activity, Neuromuscular re-education, Patient/Family education, Self Care, Joint mobilization, Orthotic/Fit training, Dry Needling, Manual lymph drainage, scar mobilization, Manual therapy, and  Re-evaluation   GOALS: Goals reviewed with patient? Yes  LONG TERM GOALS:  (STG=LTG)  GOALS Name Target Date  Goal status  1 Pt will demonstrate she has regained full shoulder ROM and function post operatively compared to baselines.  Baseline: 01/13/2023 INITIAL  2 Pts quick dash will be improved to pre-surgery level of 36% 11/14/2023 INITIAL  3 Pt will be able to dress and bathe and perform light household chores independently 01/13/2023 INITIAL  4 Pt will have decreased pain by 50% overall 01/13/2023 INITIAL     PLAN:  PT FREQUENCY/DURATION: 2x/week x 6 weeks  PLAN  FOR NEXT SESSION: STM bilateral UQ, AAROM, PROM bilateral shoulders, MLD prn especially to left axillary region   [facility]  [address]  [place] [address]  [phone]  After Breast Cancer Class It is recommended you attend the ABC class to be educated on lymphedema risk reduction. This class is free of charge and lasts for 1 hour. It is a 1-time class. You will need to download the Webex app either on your phone or computer. We will send you a link the night before or the morning of the class. You should be able to click on that link to join the class. This is not a confidential class. You don't have to turn your camera on, but other participants may be able to see your email address.  Scar massage You can begin gentle scar massage to you incision sites. Gently place one hand on the incision and move the skin (without sliding on the skin) in various directions. Do this for a few minutes and then you can gently massage either coconut oil or vitamin E cream into the scars.  Compression garment You should continue wearing your compression bra until you feel like you no longer have swelling.  Home exercise Program Continue doing the exercises you were given until you feel like you can do them without feeling any tightness at the end.   Walking Program Studies show that 30  minutes of walking per day (fast enough to elevate your heart rate) can significantly reduce the risk of a cancer recurrence. If you can't walk due to other medical reasons, we encourage you to find another activity you could do (like a stationary bike or water exercise).  Posture After breast cancer surgery, people frequently sit with rounded shoulders posture because it puts their incisions on slack and feels better. If you sit like this and scar tissue forms in that position, you can become very tight and have pain sitting or standing with good posture. Try to be aware of your posture and sit and stand up tall to heal properly.  Follow up PT: It is recommended you return every 3 months for the first 3 years following surgery to be assessed on the SOZO machine for an L-Dex score. This helps prevent clinically significant lymphedema in 95% of patients. These follow up screens are 10 minute appointments that you are not billed for.  Claris Pong, PT 12/15/2022, 10:51 AM

## 2022-12-17 ENCOUNTER — Ambulatory Visit (INDEPENDENT_AMBULATORY_CARE_PROVIDER_SITE_OTHER): Payer: 59 | Admitting: Plastic Surgery

## 2022-12-17 ENCOUNTER — Encounter: Payer: 59 | Admitting: Student

## 2022-12-17 ENCOUNTER — Encounter: Payer: Self-pay | Admitting: Plastic Surgery

## 2022-12-17 ENCOUNTER — Ambulatory Visit: Payer: 59

## 2022-12-17 DIAGNOSIS — C50411 Malignant neoplasm of upper-outer quadrant of right female breast: Secondary | ICD-10-CM

## 2022-12-17 DIAGNOSIS — M25612 Stiffness of left shoulder, not elsewhere classified: Secondary | ICD-10-CM

## 2022-12-17 DIAGNOSIS — C50911 Malignant neoplasm of unspecified site of right female breast: Secondary | ICD-10-CM

## 2022-12-17 DIAGNOSIS — M25611 Stiffness of right shoulder, not elsewhere classified: Secondary | ICD-10-CM

## 2022-12-17 DIAGNOSIS — R293 Abnormal posture: Secondary | ICD-10-CM

## 2022-12-17 NOTE — Therapy (Signed)
OUTPATIENT PHYSICAL THERAPY BREAST CANCER TREATMENT   Patient Name: Elizabeth Benton MRN: [id number] DOB:1946-10-27, 76 y.o., female Today's Date: 12/17/2022  END OF SESSION:  PT End of Session - 12/17/22 0959     Visit Number 6    Number of Visits 14    Date for PT Re-Evaluation 01/13/23    PT Start Time 1001    PT Stop Time 1055    PT Time Calculation (min) 54 min    Activity Tolerance Patient tolerated treatment well    Behavior During Therapy [facility] for tasks assessed/performed             Past Medical History:  Diagnosis Date   Arthritis    Breast cancer ([place])    Depression    H/O migraine    HSV-2 (herpes simplex virus 2) infection    Hyperlipidemia    Hypertension    Lichen sclerosus    Migraine    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   Vaginal atrophy 12/29/2007   Vaginitis and vulvovaginitis 12/28/2004   Past Surgical History:  Procedure Laterality Date   ABDOMINAL HYSTERECTOMY  1987   BREAST RECONSTRUCTION WITH PLACEMENT OF TISSUE EXPANDER AND FLEX HD (ACELLULAR HYDRATED DERMIS) Bilateral 10/20/2022   Procedure: BREAST RECONSTRUCTION WITH PLACEMENT OF TISSUE EXPANDER AND FLEX HD (ACELLULAR HYDRATED DERMIS);  Surgeon: Lennice Sites, MD;  Location: [place];  Service: Plastics;  Laterality: Bilateral;   DILATION AND CURETTAGE OF UTERUS     MASTECTOMY W/ SENTINEL NODE BIOPSY Right 10/20/2022   Procedure: RIGHT MASTECTOMY WITH RIGHT SENTINEL LYMPH NODE BIOPSY;  Surgeon: Stark Klein, MD;  Location: [place];  Service: General;  Laterality: Right;   TONSILLECTOMY     TOTAL MASTECTOMY Left 10/20/2022   Procedure: LEFT TOTAL MASTECTOMY;  Surgeon: Stark Klein, MD;  Location: [place];  Service: General;  Laterality: Left;   TUBAL LIGATION     WISDOM TOOTH EXTRACTION     Patient Active Problem List   Diagnosis Date Noted   Breast cancer ([place]) 10/20/2022   S/P breast reconstruction 10/20/2022   Breast cancer of upper-outer quadrant of right female breast ([place]) 10/20/2022    Genetic testing 08/24/2022   Family history of breast cancer 08/06/2022   Family history of prostate cancer 08/06/2022   Malignant neoplasm of upper-outer quadrant of right breast in female, estrogen receptor positive ([place]) 07/31/2022   Abnormal glucose level 12/05/2020   Constipation 12/05/2020   Lumbago with sciatica 12/05/2020   Osteopenia 12/05/2020   Overweight 12/05/2020   Screening for malignant neoplasm of colon 12/05/2020   Vitamin D deficiency 12/05/2020   Essential hypertension 11/30/2019   LBBB (left bundle branch block) 11/30/2019   Exertional dyspnea 79/01/4096   Lichen sclerosus 35/32/9924   Mixed hyperlipidemia    HSV-2 (herpes simplex virus 2) infection    Depression    H/O migraine    Monilia infection    Vaginal atrophy    Wears glasses 06/23/2011   Sinus problem/runny nose 06/23/2011   Mass on back 06/23/2011    PCP: Willey Blade, MD  REFERRING PROVIDER: Nicholas Lose, MD  REFERRING DIAG: Right Breast Cancer  THERAPY DIAG:  Malignant neoplasm of upper-outer quadrant of right breast in female, estrogen receptor positive ([place])  Abnormal posture  Stiffness of left shoulder, not elsewhere classified  Stiffness of right shoulder, not elsewhere classified  Rationale for Evaluation and Treatment: Rehabilitation  ONSET DATE: 07/24/2022  SUBJECTIVE:  SUBJECTIVE STATEMENT: Did pretty well after last visit. I got sore later that night, and woke up stiff in the am. I rested better last night and didn't even take any medicine. The breast still feels swollen, but the pain is much better.   PERTINENT HISTORY:  Patient was diagnosed on 07/24/2022 with right grade 2 Invasive Lobular Carcinoma. It measures 1.2 cm and is located in the upper-outer quadrant. It is ER 95%, PR 80%  and HER 2 negative with a Ki67 of 40%. She is s/p a Bilateral Mastectomy on 10/20/2022  for Right Breast Cancer and with left prophylactic and bilateral tissue expanders .  She has since decided that she wants the expanders removed and this will happen in January. Drains were removed on 11/23/2022,11/26/2022 and she has been told to continue compression and use vaseline over incisions. She has returned to work at home.  PATIENT GOALS:  Reassess how my recovery is going related to arm function, pain, and swelling.  PAIN:  Are you having pain?  PAIN:  Are you having pain? Yes NPRS scale: 1/10 Pain location: right breast from expander, new swelling Pain orientation: Right  PAIN TYPE: tight and heavy Pain description: constant  Aggravating factors: expander Relieving factors: pain meds help some   PRECAUTIONS: Recent Surgery, right UE Lymphedema risk, DDD, LBP  ACTIVITY LEVEL / LEISURE: nothing yet, returned to work   OBJECTIVE:   PATIENT SURVEYS:  QUICK DASH: 61.36  OBSERVATIONS: Pt with larger right breast  than secondary to expander filled more. Mastectomy incisions are healed with glue still present. Left axillary region with area of swelling/tissue greater than right. Pt will be having an [place] today on the left. Having expanders removed in early January.  POSTURE:  Forward head, rounded shoulders  LYMPHEDEMA ASSESSMENT:     UPPER EXTREMITY AROM/PROM:   A/PROM RIGHT   eval   Right 12/02/2022  Shoulder extension 60 49  Shoulder flexion 141 104  Shoulder abduction 168 68  Shoulder internal rotation 55   Shoulder external rotation 112                           (Blank rows = not tested)   A/PROM LEFT   eval LEFT 2023  Shoulder extension 58 46  Shoulder flexion 159 120  Shoulder abduction 165 90  Shoulder internal rotation 65   Shoulder external rotation 112                           (Blank rows = not tested)     CERVICAL AROM: All within functional  limits:  Decreased 20 % Rotation and 50% SB         UPPER EXTREMITY STRENGTH: WNL     LYMPHEDEMA ASSESSMENTS:    LANDMARK RIGHT   eval Right 12/02/2022  10 cm proximal to olecranon process 28.5 28.7  Olecranon process 24.6 24.6  10 cm proximal to ulnar styloid process 21.8 21.7  Just proximal to ulnar styloid process 16.4 16.3  Across hand at thumb web space 18.3 18.2  At base of 2nd digit 6.5 6.6  (Blank rows = not tested)   LANDMARK LEFT   eval LEFT 12/02/2022  10 cm proximal to olecranon process 29.3 29.7  Olecranon process 24.9 24.7  10 cm proximal to ulnar styloid process 21.0 21.4  Just proximal to ulnar styloid process 16.3 16.3  Across hand at thumb web space 18.0  17.9  At base of 2nd digit 6.45 6.4  (Blank rows = not tested)      Surgery type/Date: 10/20/2022 bilateral Mastectomy with right SLNB Number of lymph nodes removed: 0/2 right Current/past treatment (chemo, radiation, hormone therapy): No chemo required, will start anti-estrogen therapy Other symptoms:  Heaviness/tightness Yes Pain Yes Pitting edema No Infections No Decreased scar mobility Yes Stemmer sign No  TREATMENT TODAY   12/17/2022 Soft Tissue mobilization to bilateral UT, pectorals and Lats with cocoa butter in supine PROM bilateral shoulder flexion, scaption, abd, IR and ER with only occasional VC's to relax Supine AROM bilateral shoulder flexion,scaption, horizontal abd in comfortable ROM x 5 ea Gentle MFR right lower ribs during AROM for scation to ease discomfort Supine wand flexion and scaption x4 ea In supine: Short neck, 5 diaphragmatic breaths, L axillary nodes and establishment of interaxillary pathway, R inguinal nodes and establishment of axilloinguinal pathway, then R breast moving fluid towards pathways  then retracing all steps and ending with LN's Assisted pt up from table. Updated HEP with wand exercises   12/15/2022  Soft Tissue mobilization to bilateral UT, pectorals and  Lats with cocoa butter in supine PROM bilateral shoulder flexion, scaption, abd, IR and ER with fewer VC's to relax Supine AROM bilateral shoulder flexion,scaption, horizontal abd in comfortable ROM x 5 ea In supine: Short neck, 5 diaphragmatic breaths, L axillary nodes and establishment of interaxillary pathway, R inguinal nodes and establishment of axilloinguinal pathway, then R breast moving fluid towards pathways  then retracing all steps and ending with LN's Assisted pt up from table.    12/10/2022 Soft Tissue mobilization to bilateral UT, pectorals and Lats with cocoa butter in supine PROM bilateral shoulder flexion, scaption, abd, IR and ER with multiple VC's to relax Supine AROM bilateral shoulder flexion,scaption, horizontal abd in comfortable ROM x 5 ea  12/08/2022 Soft Tissue mobilization to bilateral UT, pectorals and Lats with cocoa butter in supine AAROM clasped hands flexion and stargazer x 5, wall slides x 5 PROM bilateral shoulder flexion, scaption, abd, IR and ER with multiple VC's to relax Tried supine wand x 2 but pt had trouble relaxing. Lower trunk rotation to the left x 4 to stretch lats/pecs gently   PATIENT EDUCATION:   Education details: Reviewed 4 post op exercises. Did better with abduction sliding hand on wall Person educated: Patient Education method: Explanation and Demonstration Education comprehension: returned demonstration    HOME EXERCISE PROGRAM: Reviewed previously given post op HEP. Changed wall walk to wall slide and pt demonstrated good improvement  ASSESSMENT:  CLINICAL IMPRESSION:  Pt has had excellent pain improvement since last visit. She demonstrated increased ease getting on and off the table . Right shoulder ROM still limited greater than left. Updated her HEP with wand exercises but advised to relax and not force. She will be out of town for a week.  Pt will benefit from skilled therapeutic intervention to improve on the  following deficits: Decreased knowledge of precautions, impaired UE functional use, pain, decreased ROM, postural dysfunction.   PT treatment/interventions: ADL/Self care home management, Therapeutic exercises, Therapeutic activity, Neuromuscular re-education, Patient/Family education, Self Care, Joint mobilization, Orthotic/Fit training, Dry Needling, Manual lymph drainage, scar mobilization, Manual therapy, and Re-evaluation   GOALS: Goals reviewed with patient? Yes  LONG TERM GOALS:  (STG=LTG)  GOALS Name Target Date  Goal status  1 Pt will demonstrate she has regained full shoulder ROM and function post operatively compared to baselines.  Baseline: 01/13/2023 INITIAL  2 Pts quick dash will be improved to pre-surgery level of 36% 11/14/2023 INITIAL  3 Pt will be able to dress and bathe and perform light household chores independently 01/13/2023 INITIAL  4 Pt will have decreased pain by 50% overall 01/13/2023 INITIAL     PLAN:  PT FREQUENCY/DURATION: 2x/week x 6 weeks  PLAN FOR NEXT SESSION: STM bilateral UQ, AAROM, PROM bilateral shoulders, MLD prn especially to left axillary region   [facility]  [address], [address]  [place] [address]  [phone]  After Breast Cancer Class It is recommended you attend the ABC class to be educated on lymphedema risk reduction. This class is free of charge and lasts for 1 hour. It is a 1-time class. You will need to download the Webex app either on your phone or computer. We will send you a link the night before or the morning of the class. You should be able to click on that link to join the class. This is not a confidential class. You don't have to turn your camera on, but other participants may be able to see your email address.  Scar massage You can begin gentle scar massage to you incision sites. Gently place one hand on the incision and move the skin (without sliding on the skin) in various directions. Do this  for a few minutes and then you can gently massage either coconut oil or vitamin E cream into the scars.  Compression garment You should continue wearing your compression bra until you feel like you no longer have swelling.  Home exercise Program Continue doing the exercises you were given until you feel like you can do them without feeling any tightness at the end.   Walking Program Studies show that 30 minutes of walking per day (fast enough to elevate your heart rate) can significantly reduce the risk of a cancer recurrence. If you can't walk due to other medical reasons, we encourage you to find another activity you could do (like a stationary bike or water exercise).  Posture After breast cancer surgery, people frequently sit with rounded shoulders posture because it puts their incisions on slack and feels better. If you sit like this and scar tissue forms in that position, you can become very tight and have pain sitting or standing with good posture. Try to be aware of your posture and sit and stand up tall to heal properly.  Follow up PT: It is recommended you return every 3 months for the first 3 years following surgery to be assessed on the SOZO machine for an L-Dex score. This helps prevent clinically significant lymphedema in 95% of patients. These follow up screens are 10 minute appointments that you are not billed for.  Claris Pong, PT 12/17/2022, 11:00 AM

## 2022-12-17 NOTE — Progress Notes (Signed)
Elizabeth Benton returns today to discuss her surgery.  Waiting to be scheduled for removal of her bilateral tissue expanders due to persistent pain after placement.  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  She is doing well.  She is finished physical therapy and lymphedema therapy.  The of these seem to be improving both her pain and the seroma collections.  On exam she has bilateral tissue expanders in place the incisions are well-healed.  There is no evidence of infection or erythema.  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  We discussed the procedure at length including the risks of bleeding infection seroma formation and wound healing complications.  She understands that she will require drains postoperatively.  She would like to proceed with surgery.

## 2022-12-17 NOTE — Patient Instructions (Signed)
SHOULDER: Flexion - Supine (Cane)        Cancer Rehab [phone]    Hold cane in both hands. Raise arms up overhead. Do not allow back to arch. Hold _5__ seconds. Do __5__ times; __1-2__ times a day.  Hands shoulder width apart 2. Hands slightly wider than shoulder width (Y position)

## 2022-12-23 ENCOUNTER — Telehealth: Payer: Self-pay | Admitting: *Deleted

## 2022-12-23 NOTE — Telephone Encounter (Signed)
Patients daughter Mackie Pai called in. Curious about changing sx plan , message sent to [facility] upon daughter's request for a call back to discuss further.

## 2022-12-25 ENCOUNTER — Telehealth: Payer: Self-pay

## 2022-12-25 NOTE — Telephone Encounter (Signed)
I called the patient's daughter back and spoke to her in regards to her question about fat grafting.  I discussed with the patient's daughter that I spoke with Dr. Lovena Le and fat grafting as an option of reconstruction is not a possibility for her mother.  Patient's daughter expressed understanding and all of her questions were answered to her satisfaction.  We will plan to move forward with expander removal as planned.  Patient's daughter was in agreement with this plan.

## 2022-12-25 NOTE — Telephone Encounter (Signed)
Daughter returned your call and apologizes she missed it. Thanks.

## 2022-12-29 ENCOUNTER — Ambulatory Visit: Payer: 59 | Attending: Hematology and Oncology

## 2022-12-29 DIAGNOSIS — M25611 Stiffness of right shoulder, not elsewhere classified: Secondary | ICD-10-CM | POA: Insufficient documentation

## 2022-12-29 DIAGNOSIS — Z170 Estrogen receptor positive status [ER+]: Secondary | ICD-10-CM | POA: Diagnosis present

## 2022-12-29 DIAGNOSIS — C50411 Malignant neoplasm of upper-outer quadrant of right female breast: Secondary | ICD-10-CM | POA: Insufficient documentation

## 2022-12-29 DIAGNOSIS — M25612 Stiffness of left shoulder, not elsewhere classified: Secondary | ICD-10-CM | POA: Insufficient documentation

## 2022-12-29 DIAGNOSIS — R293 Abnormal posture: Secondary | ICD-10-CM | POA: Diagnosis present

## 2022-12-29 NOTE — H&P (View-Only) (Signed)
Patient ID: Elizabeth Benton, female    DOB: 08/02/1946, 77 y.o.   MRN: [id number]  Chief Complaint  Patient presents with   Pre-op Exam      ICD-10-CM   1. 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  female  with a history of breast cancer.  She presents for preoperative evaluation for upcoming procedure, removal of bilateral tissue expanders without placement of implants, scheduled for 01/08/2023 with Dr. Lovena Le.  The patient has not had problems with anesthesia.  Patient has a history of breast cancer.  She denies any cardiac disease or anything that she follows up with a cardiologist for.  Patient denies smoking.  Patient denies taking any birth control or hormone replacement.  She reports that she has history of 1 miscarriage.  She denies any personal or family history of blood clots or clotting diseases.  She denies any recent traumas, surgeries or infections.  She denies any recent hospitalizations.  She denies history of stroke or heart attack.  She denies any history of Crohn's disease or ulcerative colitis.  She denies any history of COPD or asthma.  Patient denies any varicose veins.  She does report swollen legs sometimes.  She denies any recent fevers, chills or recent changes in her health.  Summary of Previous Visit: Patient was last seen in the clinic by Dr. Lovena Le on 12/17/2022.  At this visit, she was doing well.  On exam, patient had bilateral tissue expanders in place.  The incisions were healing well.  There is no signs of infection.  She had bilateral seromas noted.  Patient expressed that she would like to move forward with her surgery to remove her breast tissue expanders.  Job: Patient currently works at a desk job.  PMH Significant for: Breast cancer, hypertension   Past Medical History: Allergies: Allergies  Allergen  Reactions   Contrast Media [Iodinated Contrast Media] Anaphylaxis   Demerol Other (See Comments)    Patient stated she will pass out, unaware of surroundings. Last time she received it (for surgery) her stomach had to be pumped.   Iodine Anaphylaxis   Anastrozole Itching    Current Medications:  Current Outpatient Medications:    acetaminophen (TYLENOL) 650 MG CR tablet, Take 650 mg by mouth every 8 (eight) hours as needed for pain., Disp: , Rfl:    ALPRAZolam (XANAX) 0.25 MG tablet, Take 0.25-0.5 mg by mouth every 8 (eight) hours as needed for anxiety., Disp: , Rfl:    amLODipine (NORVASC) 5 MG tablet, Take 1.5 tablets (7.5 mg total) by mouth daily. (Patient taking differently: Take 5 mg by mouth daily.), Disp: 120 tablet, Rfl: 1   betamethasone dipropionate 0.05 % cream, Apply 1 Application topically 2 (two) times daily as needed for rash., Disp: , Rfl:    Carboxymethylcellul-Glycerin (LUBRICATING EYE DROPS OP), Place 1 drop into both eyes daily as needed (dry eyes)., Disp: , Rfl:    diazepam (VALIUM) 2 MG tablet, Take 1 tablet (2 mg total) by mouth every 12 (twelve) hours as needed for up to 6 doses for muscle spasms., Disp: 6 tablet, Rfl: 0   gabapentin (NEURONTIN) 100 MG capsule, Take 1 capsule (100 mg total) by mouth 2 (two) times daily., Disp: 60 capsule, Rfl: 0   HYDROcodone-acetaminophen (NORCO/VICODIN) 5-325 MG tablet, Take 1-2 tablets by mouth  every 4 (four) hours as needed for moderate pain., Disp: 15 tablet, Rfl: 0   ketorolac (TORADOL) 10 MG tablet, Take 1 tablet (10 mg total) by mouth every 8 (eight) hours as needed for up to 10 doses., Disp: 10 tablet, Rfl: 0   letrozole (FEMARA) 2.5 MG tablet, Take 1 tablet (2.5 mg total) by mouth daily., Disp: 30 tablet, Rfl: 0   methocarbamol (ROBAXIN) 500 MG tablet, Take 1 tablet (500 mg total) by mouth every 8 (eight) hours as needed for up to 15 doses for muscle spasms., Disp: 15 tablet, Rfl: 0   ondansetron (ZOFRAN) 8 MG tablet, Take 1  tablet (8 mg total) by mouth every 8 (eight) hours as needed for up to 40 doses for nausea or vomiting., Disp: 20 tablet, Rfl: 1   rosuvastatin (CRESTOR) 20 MG tablet, TAKE 1 TABLET BY MOUTH EVERY DAY, Disp: 90 tablet, Rfl: 3   senna (SENOKOT) 8.6 MG TABS tablet, Take 1 tablet (8.6 mg total) by mouth 2 (two) times daily., Disp: 120 tablet, Rfl: 0   traMADol (ULTRAM) 50 MG tablet, Take 1 tablet (50 mg total) by mouth every 8 (eight) hours as needed for up to 20 doses., Disp: 20 tablet, Rfl: 0  Past Medical Problems: Past Medical History:  Diagnosis Date   Arthritis    Breast cancer ([place])    Depression    H/O migraine    HSV-2 (herpes simplex virus 2) infection    Hyperlipidemia    Hypertension    Lichen sclerosus    Migraine    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   Vaginal atrophy 12/29/2007   Vaginitis and vulvovaginitis 12/28/2004    Past Surgical History: Past Surgical History:  Procedure Laterality Date   ABDOMINAL HYSTERECTOMY  1987   BREAST RECONSTRUCTION WITH PLACEMENT OF TISSUE EXPANDER AND FLEX HD (ACELLULAR HYDRATED DERMIS) Bilateral 10/20/2022   Procedure: BREAST RECONSTRUCTION WITH PLACEMENT OF TISSUE EXPANDER AND FLEX HD (ACELLULAR HYDRATED DERMIS);  Surgeon: Lennice Sites, MD;  Location: [facility];  Service: Plastics;  Laterality: Bilateral;   DILATION AND CURETTAGE OF UTERUS     MASTECTOMY W/ SENTINEL NODE BIOPSY Right 10/20/2022   Procedure: RIGHT MASTECTOMY WITH RIGHT SENTINEL LYMPH NODE BIOPSY;  Surgeon: Stark Klein, MD;  Location: [facility];  Service: General;  Laterality: Right;   TONSILLECTOMY     TOTAL MASTECTOMY Left 10/20/2022   Procedure: LEFT TOTAL MASTECTOMY;  Surgeon: Stark Klein, MD;  Location: [facility];  Service: General;  Laterality: Left;   TUBAL LIGATION     WISDOM TOOTH EXTRACTION      Social History: Social History   Socioeconomic History   Marital status: Married    Spouse name: Junior Kenedy   Number of children: 2   Years of education: Not on file    Highest education level: Not on file  Occupational History   Not on file  Tobacco Use   Smoking status: Former    Packs/day: 0.50    Years: 40.00    Total pack years: 20.00    Types: Cigarettes    Quit date: 01/22/1997    Years since quitting: 25.9   Smokeless tobacco: Never  Vaping Use   Vaping Use: Never used  Substance and Sexual Activity   Alcohol use: Yes    Alcohol/week: 1.0 standard drink of alcohol    Types: 1 Standard drinks or equivalent per week    Comment: occ   Drug use: No   Sexual activity: Not Currently    Birth control/protection:  Post-menopausal    Comment: hyst  Other Topics Concern   Not on file  Social History Narrative   Not on file   Social Determinants of Health   Financial Resource Strain: Not on file  Food Insecurity: Not on file  Transportation Needs: Not on file  Physical Activity: Not on file  Stress: Not on file  Social Connections: Not on file  Intimate Partner Violence: Not on file    Family History: Family History  Problem Relation Age of Onset   Stroke Mother    Throat cancer Father 43   Prostate cancer Brother 76   Breast cancer Maternal Aunt 65   Prostate cancer Maternal Uncle    Prostate cancer Maternal Uncle    Prostate cancer Maternal Uncle    Pancreatic cancer Paternal Uncle    Breast cancer Cousin        4 maternal first cousins   Prostate cancer Cousin    Breast cancer Other        maternal first cousin once removed   Other Other        BRCA+    Review of Systems: Denies fevers, recent changes in health  Physical Exam: Vital Signs BP 137/78 (BP Location: Right Arm, Patient Position: Sitting, Cuff Size: Small)   Pulse 96   SpO2 99%   Physical Exam  Constitutional:      General: Not in acute distress.    Appearance: Normal appearance. Not ill-appearing.  HENT:     Head: Normocephalic and atraumatic.  Neck:     Musculoskeletal: Normal range of motion.  Cardiovascular:     Rate and Rhythm: Normal  rate Pulmonary:     Effort: Pulmonary effort is normal. No respiratory distress.  Musculoskeletal: Normal range of motion, trace swelling to lower extremities  Skin:    General: Skin is warm and dry.     Findings: No erythema or rash.  Neurological:     Mental Status: Alert and oriented to person, place, and time. Mental status is at baseline.  Psychiatric:        Mood and Affect: Mood normal.        Behavior: Behavior normal.    Assessment/Plan: The patient is scheduled for removal of bilateral tissue expanders with Dr. Lovena Le.  Risks, benefits, and alternatives of procedure discussed, questions answered and consent obtained.    Smoking Status: Non-smoker; Counseling Given?  N/A  Caprini Score: 8; Risk Factors include: Age, history of malignancy, swollen legs and length of planned surgery. Recommendation for mechanical and possible pharmacological prophylaxis. Encourage early ambulation. I will discuss the possibility of pharmacological prophylaxis with Dr. Lovena Le.  Pictures obtained: 12/17/2022  Post-op Rx sent to pharmacy:  Norco, Zofran  I instructed the patient to hold her Xanax and Robaxin the day of surgery.  Patient reports she is no longer taking Valium, gabapentin, Norco, Toradol or tramadol.  I instructed the patient to hold any herbal teas, dietary supplements or vitamins 1 week prior to surgery.  Patient expressed understanding.  Patient was provided with the General Surgical Risk consent document and Pain Medication Agreement prior to their appointment.  They had adequate time to read through the risk consent documents and Pain Medication Agreement. We also discussed them in person together during this preop appointment. All of their questions were answered to their satisfaction.  Recommended calling if they have any further questions.  Risk consent form and Pain Medication Agreement to be scanned into patient's chart.  The consent was obtained  with risks and complications  reviewed which included bleeding, pain, scar, infection and the risk of anesthesia.  The patients questions were answered to the patients expressed satisfaction.    Electronically signed by: [name], PA-C 12/30/2022 3:45 PM

## 2022-12-29 NOTE — Therapy (Signed)
OUTPATIENT PHYSICAL THERAPY BREAST CANCER TREATMENT   Patient Name: Elizabeth Benton MRN: [id number] DOB:1946/08/28, 77 y.o., female Today's Date: 12/29/2022  END OF SESSION:  PT End of Session - 12/29/22 1004     Visit Number 7    Number of Visits 14    Date for PT Re-Evaluation 01/13/23    PT Start Time 1005    PT Stop Time 1057    PT Time Calculation (min) 52 min    Activity Tolerance Patient tolerated treatment well    Behavior During Therapy WFL for tasks assessed/performed             Past Medical History:  Diagnosis Date   Arthritis    Breast cancer ([place])    Depression    H/O migraine    HSV-2 (herpes simplex virus 2) infection    Hyperlipidemia    Hypertension    Lichen sclerosus    Migraine    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   Vaginal atrophy 12/29/2007   Vaginitis and vulvovaginitis 12/28/2004   Past Surgical History:  Procedure Laterality Date   ABDOMINAL HYSTERECTOMY  1987   BREAST RECONSTRUCTION WITH PLACEMENT OF TISSUE EXPANDER AND FLEX HD (ACELLULAR HYDRATED DERMIS) Bilateral 10/20/2022   Procedure: BREAST RECONSTRUCTION WITH PLACEMENT OF TISSUE EXPANDER AND FLEX HD (ACELLULAR HYDRATED DERMIS);  Surgeon: Lennice Sites, MD;  Location: [facility];  Service: Plastics;  Laterality: Bilateral;   DILATION AND CURETTAGE OF UTERUS     MASTECTOMY W/ SENTINEL NODE BIOPSY Right 10/20/2022   Procedure: RIGHT MASTECTOMY WITH RIGHT SENTINEL LYMPH NODE BIOPSY;  Surgeon: Stark Klein, MD;  Location: [facility];  Service: General;  Laterality: Right;   TONSILLECTOMY     TOTAL MASTECTOMY Left 10/20/2022   Procedure: LEFT TOTAL MASTECTOMY;  Surgeon: Stark Klein, MD;  Location: [facility];  Service: General;  Laterality: Left;   TUBAL LIGATION     WISDOM TOOTH EXTRACTION     Patient Active Problem List   Diagnosis Date Noted   Breast cancer ([place]) 10/20/2022   S/P breast reconstruction 10/20/2022   Breast cancer of upper-outer quadrant of right female breast ([place]) 10/20/2022    Genetic testing 08/24/2022   Family history of breast cancer 08/06/2022   Family history of prostate cancer 08/06/2022   Malignant neoplasm of upper-outer quadrant of right breast in female, estrogen receptor positive ([place]) 07/31/2022   Abnormal glucose level 12/05/2020   Constipation 12/05/2020   Lumbago with sciatica 12/05/2020   Osteopenia 12/05/2020   Overweight 12/05/2020   Screening for malignant neoplasm of colon 12/05/2020   Vitamin D deficiency 12/05/2020   Essential hypertension 11/30/2019   LBBB (left bundle branch block) 11/30/2019   Exertional dyspnea 24/08/7352   Lichen sclerosus 29/92/4268   Mixed hyperlipidemia    HSV-2 (herpes simplex virus 2) infection    Depression    H/O migraine    Monilia infection    Vaginal atrophy    Wears glasses 06/23/2011   Sinus problem/runny nose 06/23/2011   Mass on back 06/23/2011    PCP: Willey Blade, MD  REFERRING PROVIDER: Nicholas Lose, MD  REFERRING DIAG: Right Breast Cancer  THERAPY DIAG:  Malignant neoplasm of upper-outer quadrant of right breast in female, estrogen receptor positive ([place])  Abnormal posture  Stiffness of left shoulder, not elsewhere classified  Stiffness of right shoulder, not elsewhere classified  Rationale for Evaluation and Treatment: Rehabilitation  ONSET DATE: 07/24/2022  SUBJECTIVE:  SUBJECTIVE STATEMENT I tried to keep up with my exercises but when I got home this Sunday I got sore and stiff. I feel better today. It still gets a little sore under the breast. Surgery to remove the expanders is on January 12. I can put glasses on the second shelf now, dressing easier   PERTINENT HISTORY:  Patient was diagnosed on 07/24/2022 with right grade 2 Invasive Lobular Carcinoma. It measures 1.2 cm and is located  in the upper-outer quadrant. It is ER 95%, PR 80% and HER 2 negative with a Ki67 of 40%. She is s/p a Bilateral Mastectomy on 10/20/2022  for Right Breast Cancer and with left prophylactic and bilateral tissue expanders .  She has since decided that she wants the expanders removed and this will happen in January. Drains were removed on 11/23/2022,11/26/2022 and she has been told to continue compression and use vaseline over incisions. She has returned to work at home.  PATIENT GOALS:  Reassess how my recovery is going related to arm function, pain, and swelling.  PAIN:  Are you having pain?  PAIN:  Are you having pain? Yes NPRS scale: 1-2/10 discomfort. Pain location: right breast from expander, new swelling Pain orientation: Right  PAIN TYPE: tight and heavy Pain description: constant  Aggravating factors: expander Relieving factors: pain meds help some   PRECAUTIONS: Recent Surgery, right UE Lymphedema risk, DDD, LBP  ACTIVITY LEVEL / LEISURE: nothing yet, returned to work   OBJECTIVE:   PATIENT SURVEYS:  QUICK DASH: 61.36  OBSERVATIONS: Pt with larger right breast  than secondary to expander filled more. Mastectomy incisions are healed with glue still present. Left axillary region with area of swelling/tissue greater than right. Pt will be having an [place] today on the left. Having expanders removed in early January.  POSTURE:  Forward head, rounded shoulders  LYMPHEDEMA ASSESSMENT:     UPPER EXTREMITY AROM/PROM:   A/PROM RIGHT   eval   Right 12/02/2022 RIGHT 12/29/2022  Shoulder extension 60 49 63  Shoulder flexion 141 104 147  Shoulder abduction 168 68 145  Shoulder internal rotation 55    Shoulder external rotation 112                            (Blank rows = not tested)   A/PROM LEFT   eval LEFT 2023 LEFT 12/29/2022  Shoulder extension 58 46 65  Shoulder flexion 159 120 160  Shoulder abduction 165 90 156  Shoulder internal rotation 65    Shoulder external  rotation 112                            (Blank rows = not tested)     CERVICAL AROM: All within functional  limits: Decreased 20 % Rotation and 50% SB         UPPER EXTREMITY STRENGTH: WNL     LYMPHEDEMA ASSESSMENTS:    LANDMARK RIGHT   eval Right 12/02/2022  10 cm proximal to olecranon process 28.5 28.7  Olecranon process 24.6 24.6  10 cm proximal to ulnar styloid process 21.8 21.7  Just proximal to ulnar styloid process 16.4 16.3  Across hand at thumb web space 18.3 18.2  At base of 2nd digit 6.5 6.6  (Blank rows = not tested)   LANDMARK LEFT   eval LEFT 12/02/2022  10 cm proximal to olecranon process 29.3 29.7  Olecranon process 24.9 24.7  10 cm proximal to ulnar styloid process 21.0 21.4  Just proximal to ulnar styloid process 16.3 16.3  Across hand at thumb web space 18.0 17.9  At base of 2nd digit 6.45 6.4  (Blank rows = not tested)      Surgery type/Date: 10/20/2022 bilateral Mastectomy with right SLNB Number of lymph nodes removed: 0/2 right Current/past treatment (chemo, radiation, hormone therapy): No chemo required, will start anti-estrogen therapy Other symptoms:  Heaviness/tightness Yes Pain Yes Pitting edema No Infections No Decreased scar mobility Yes Stemmer sign No  TREATMENT TODAY  12/29/2022 Pulleys x 2 min ea for shoulder Flexion and abduction Soft Tissue mobilization to right UT, pectorals and Lats with cocoa butter in supine PROM bilateral shoulder flexion, scaption, abd, IR and ER with only occasional VC's to relax In supine: Short neck, 5 diaphragmatic breaths, L axillary nodes and establishment of interaxillary pathway, R inguinal nodes and establishment of axilloinguinal pathway, then R breast moving fluid towards pathways  then retracing all steps and ending with LN's Measured ROM  12/17/2022 Soft Tissue mobilization to bilateral UT, pectorals and Lats with cocoa butter in supine PROM bilateral shoulder flexion, scaption, abd, IR and  ER with only occasional VC's to relax Supine AROM bilateral shoulder flexion,scaption, horizontal abd in comfortable ROM x 5 ea Gentle MFR right lower ribs during AROM for scation to ease discomfort Supine wand flexion and scaption x4 ea In supine: Short neck, 5 diaphragmatic breaths, L axillary nodes and establishment of interaxillary pathway, R inguinal nodes and establishment of axilloinguinal pathway, then R breast moving fluid towards pathways  then retracing all steps and ending with LN's Assisted pt up from table. Updated HEP with wand exercises   12/15/2022  Soft Tissue mobilization to bilateral UT, pectorals and Lats with cocoa butter in supine PROM bilateral shoulder flexion, scaption, abd, IR and ER with fewer VC's to relax Supine AROM bilateral shoulder flexion,scaption, horizontal abd in comfortable ROM x 5 ea In supine: Short neck, 5 diaphragmatic breaths, L axillary nodes and establishment of interaxillary pathway, R inguinal nodes and establishment of axilloinguinal pathway, then R breast moving fluid towards pathways  then retracing all steps and ending with LN's Assisted pt up from table.    12/10/2022 Soft Tissue mobilization to bilateral UT, pectorals and Lats with cocoa butter in supine PROM bilateral shoulder flexion, scaption, abd, IR and ER with multiple VC's to relax Supine AROM bilateral shoulder flexion,scaption, horizontal abd in comfortable ROM x 5 ea  12/08/2022 Soft Tissue mobilization to bilateral UT, pectorals and Lats with cocoa butter in supine AAROM clasped hands flexion and stargazer x 5, wall slides x 5 PROM bilateral shoulder flexion, scaption, abd, IR and ER with multiple VC's to relax Tried supine wand x 2 but pt had trouble relaxing. Lower trunk rotation to the left x 4 to stretch lats/pecs gently   PATIENT EDUCATION:   Education details: Reviewed 4 post op exercises. Did better with abduction sliding hand on wall Person educated:  Patient Education method: Explanation and Demonstration Education comprehension: returned demonstration    HOME EXERCISE PROGRAM: Reviewed previously given post op HEP. Changed wall walk to wall slide and pt demonstrated good improvement  ASSESSMENT:  CLINICAL IMPRESSION: Pt has achieved LTG 3 and 4 and is making excellent progress with ROM now.  She is very compliant with HEP.Right shoulder limited slightly more than left but greatly improved. Pt has expander removal Next Friday and will be out of therapy for several weeks after that.  Pt will benefit from skilled therapeutic intervention to improve on the following deficits: Decreased knowledge of precautions, impaired UE functional use, pain, decreased ROM, postural dysfunction.   PT treatment/interventions: ADL/Self care home management, Therapeutic exercises, Therapeutic activity, Neuromuscular re-education, Patient/Family education, Self Care, Joint mobilization, Orthotic/Fit training, Dry Needling, Manual lymph drainage, scar mobilization, Manual therapy, and Re-evaluation   GOALS: Goals reviewed with patient? Yes  LONG TERM GOALS:  (STG=LTG)  GOALS Name Target Date  Goal status  1 Pt will demonstrate she has regained full shoulder ROM and function post operatively compared to baselines.  Baseline: 01/13/2023 INITIAL  2 Pts quick dash will be improved to pre-surgery level of 36% 11/14/2023 INITIAL  3 Pt will be able to dress and bathe and perform light household chores independently 01/13/2023 MET 12/29/2022  4 Pt will have decreased pain by 50% overall 01/13/2023 MET 12/29/2022     PLAN:  PT FREQUENCY/DURATION: 2x/week x 6 weeks  PLAN FOR NEXT SESSION: STM bilateral UQ, AAROM, PROM bilateral shoulders, MLD prn especially to left axillary region   [facility]  [address], [address]  [place] [address]  [phone]  After Breast Cancer Class It is recommended you attend the ABC class to  be educated on lymphedema risk reduction. This class is free of charge and lasts for 1 hour. It is a 1-time class. You will need to download the Webex app either on your phone or computer. We will send you a link the night before or the morning of the class. You should be able to click on that link to join the class. This is not a confidential class. You don't have to turn your camera on, but other participants may be able to see your email address.  Scar massage You can begin gentle scar massage to you incision sites. Gently place one hand on the incision and move the skin (without sliding on the skin) in various directions. Do this for a few minutes and then you can gently massage either coconut oil or vitamin E cream into the scars.  Compression garment You should continue wearing your compression bra until you feel like you no longer have swelling.  Home exercise Program Continue doing the exercises you were given until you feel like you can do them without feeling any tightness at the end.   Walking Program Studies show that 30 minutes of walking per day (fast enough to elevate your heart rate) can significantly reduce the risk of a cancer recurrence. If you can't walk due to other medical reasons, we encourage you to find another activity you could do (like a stationary bike or water exercise).  Posture After breast cancer surgery, people frequently sit with rounded shoulders posture because it puts their incisions on slack and feels better. If you sit like this and scar tissue forms in that position, you can become very tight and have pain sitting or standing with good posture. Try to be aware of your posture and sit and stand up tall to heal properly.  Follow up PT: It is recommended you return every 3 months for the first 3 years following surgery to be assessed on the SOZO machine for an L-Dex score. This helps prevent clinically significant lymphedema in 95% of patients. These follow up  screens are 10 minute appointments that you are not billed for.  Claris Pong, PT 12/29/2022, 10:57 AM

## 2022-12-29 NOTE — Progress Notes (Signed)
Patient ID: Elizabeth Benton, female    DOB: 08/02/1946, 77 y.o.   MRN: [id number]  Chief Complaint  Patient presents with   Pre-op Exam      ICD-10-CM   1. 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  female  with a history of breast cancer.  She presents for preoperative evaluation for upcoming procedure, removal of bilateral tissue expanders without placement of implants, scheduled for 01/08/2023 with Dr. Lovena Le.  The patient has not had problems with anesthesia.  Patient has a history of breast cancer.  She denies any cardiac disease or anything that she follows up with a cardiologist for.  Patient denies smoking.  Patient denies taking any birth control or hormone replacement.  She reports that she has history of 1 miscarriage.  She denies any personal or family history of blood clots or clotting diseases.  She denies any recent traumas, surgeries or infections.  She denies any recent hospitalizations.  She denies history of stroke or heart attack.  She denies any history of Crohn's disease or ulcerative colitis.  She denies any history of COPD or asthma.  Patient denies any varicose veins.  She does report swollen legs sometimes.  She denies any recent fevers, chills or recent changes in her health.  Summary of Previous Visit: Patient was last seen in the clinic by Dr. Lovena Le on 12/17/2022.  At this visit, she was doing well.  On exam, patient had bilateral tissue expanders in place.  The incisions were healing well.  There is no signs of infection.  She had bilateral seromas noted.  Patient expressed that she would like to move forward with her surgery to remove her breast tissue expanders.  Job: Patient currently works at a desk job.  PMH Significant for: Breast cancer, hypertension   Past Medical History: Allergies: Allergies  Allergen  Reactions   Contrast Media [Iodinated Contrast Media] Anaphylaxis   Demerol Other (See Comments)    Patient stated she will pass out, unaware of surroundings. Last time she received it (for surgery) her stomach had to be pumped.   Iodine Anaphylaxis   Anastrozole Itching    Current Medications:  Current Outpatient Medications:    acetaminophen (TYLENOL) 650 MG CR tablet, Take 650 mg by mouth every 8 (eight) hours as needed for pain., Disp: , Rfl:    ALPRAZolam (XANAX) 0.25 MG tablet, Take 0.25-0.5 mg by mouth every 8 (eight) hours as needed for anxiety., Disp: , Rfl:    amLODipine (NORVASC) 5 MG tablet, Take 1.5 tablets (7.5 mg total) by mouth daily. (Patient taking differently: Take 5 mg by mouth daily.), Disp: 120 tablet, Rfl: 1   betamethasone dipropionate 0.05 % cream, Apply 1 Application topically 2 (two) times daily as needed for rash., Disp: , Rfl:    Carboxymethylcellul-Glycerin (LUBRICATING EYE DROPS OP), Place 1 drop into both eyes daily as needed (dry eyes)., Disp: , Rfl:    diazepam (VALIUM) 2 MG tablet, Take 1 tablet (2 mg total) by mouth every 12 (twelve) hours as needed for up to 6 doses for muscle spasms., Disp: 6 tablet, Rfl: 0   gabapentin (NEURONTIN) 100 MG capsule, Take 1 capsule (100 mg total) by mouth 2 (two) times daily., Disp: 60 capsule, Rfl: 0   HYDROcodone-acetaminophen (NORCO/VICODIN) 5-325 MG tablet, Take 1-2 tablets by mouth  every 4 (four) hours as needed for moderate pain., Disp: 15 tablet, Rfl: 0   ketorolac (TORADOL) 10 MG tablet, Take 1 tablet (10 mg total) by mouth every 8 (eight) hours as needed for up to 10 doses., Disp: 10 tablet, Rfl: 0   letrozole (FEMARA) 2.5 MG tablet, Take 1 tablet (2.5 mg total) by mouth daily., Disp: 30 tablet, Rfl: 0   methocarbamol (ROBAXIN) 500 MG tablet, Take 1 tablet (500 mg total) by mouth every 8 (eight) hours as needed for up to 15 doses for muscle spasms., Disp: 15 tablet, Rfl: 0   ondansetron (ZOFRAN) 8 MG tablet, Take 1  tablet (8 mg total) by mouth every 8 (eight) hours as needed for up to 40 doses for nausea or vomiting., Disp: 20 tablet, Rfl: 1   rosuvastatin (CRESTOR) 20 MG tablet, TAKE 1 TABLET BY MOUTH EVERY DAY, Disp: 90 tablet, Rfl: 3   senna (SENOKOT) 8.6 MG TABS tablet, Take 1 tablet (8.6 mg total) by mouth 2 (two) times daily., Disp: 120 tablet, Rfl: 0   traMADol (ULTRAM) 50 MG tablet, Take 1 tablet (50 mg total) by mouth every 8 (eight) hours as needed for up to 20 doses., Disp: 20 tablet, Rfl: 0  Past Medical Problems: Past Medical History:  Diagnosis Date   Arthritis    Breast cancer ([place])    Depression    H/O migraine    HSV-2 (herpes simplex virus 2) infection    Hyperlipidemia    Hypertension    Lichen sclerosus    Migraine    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   Vaginal atrophy 12/29/2007   Vaginitis and vulvovaginitis 12/28/2004    Past Surgical History: Past Surgical History:  Procedure Laterality Date   ABDOMINAL HYSTERECTOMY  1987   BREAST RECONSTRUCTION WITH PLACEMENT OF TISSUE EXPANDER AND FLEX HD (ACELLULAR HYDRATED DERMIS) Bilateral 10/20/2022   Procedure: BREAST RECONSTRUCTION WITH PLACEMENT OF TISSUE EXPANDER AND FLEX HD (ACELLULAR HYDRATED DERMIS);  Surgeon: Lennice Sites, MD;  Location: [place];  Service: Plastics;  Laterality: Bilateral;   DILATION AND CURETTAGE OF UTERUS     MASTECTOMY W/ SENTINEL NODE BIOPSY Right 10/20/2022   Procedure: RIGHT MASTECTOMY WITH RIGHT SENTINEL LYMPH NODE BIOPSY;  Surgeon: Stark Klein, MD;  Location: [place];  Service: General;  Laterality: Right;   TONSILLECTOMY     TOTAL MASTECTOMY Left 10/20/2022   Procedure: LEFT TOTAL MASTECTOMY;  Surgeon: Stark Klein, MD;  Location: [place];  Service: General;  Laterality: Left;   TUBAL LIGATION     WISDOM TOOTH EXTRACTION      Social History: Social History   Socioeconomic History   Marital status: Married    Spouse name: Junior Kenedy   Number of children: 2   Years of education: Not on file    Highest education level: Not on file  Occupational History   Not on file  Tobacco Use   Smoking status: Former    Packs/day: 0.50    Years: 40.00    Total pack years: 20.00    Types: Cigarettes    Quit date: 01/22/1997    Years since quitting: 25.9   Smokeless tobacco: Never  Vaping Use   Vaping Use: Never used  Substance and Sexual Activity   Alcohol use: Yes    Alcohol/week: 1.0 standard drink of alcohol    Types: 1 Standard drinks or equivalent per week    Comment: occ   Drug use: No   Sexual activity: Not Currently    Birth control/protection:  Post-menopausal    Comment: hyst  Other Topics Concern   Not on file  Social History Narrative   Not on file   Social Determinants of Health   Financial Resource Strain: Not on file  Food Insecurity: Not on file  Transportation Needs: Not on file  Physical Activity: Not on file  Stress: Not on file  Social Connections: Not on file  Intimate Partner Violence: Not on file    Family History: Family History  Problem Relation Age of Onset   Stroke Mother    Throat cancer Father 43   Prostate cancer Brother 76   Breast cancer Maternal Aunt 65   Prostate cancer Maternal Uncle    Prostate cancer Maternal Uncle    Prostate cancer Maternal Uncle    Pancreatic cancer Paternal Uncle    Breast cancer Cousin        4 maternal first cousins   Prostate cancer Cousin    Breast cancer Other        maternal first cousin once removed   Other Other        BRCA+    Review of Systems: Denies fevers, recent changes in health  Physical Exam: Vital Signs BP 137/78 (BP Location: Right Arm, Patient Position: Sitting, Cuff Size: Small)   Pulse 96   SpO2 99%   Physical Exam  Constitutional:      General: Not in acute distress.    Appearance: Normal appearance. Not ill-appearing.  HENT:     Head: Normocephalic and atraumatic.  Neck:     Musculoskeletal: Normal range of motion.  Cardiovascular:     Rate and Rhythm: Normal  rate Pulmonary:     Effort: Pulmonary effort is normal. No respiratory distress.  Musculoskeletal: Normal range of motion, trace swelling to lower extremities  Skin:    General: Skin is warm and dry.     Findings: No erythema or rash.  Neurological:     Mental Status: Alert and oriented to person, place, and time. Mental status is at baseline.  Psychiatric:        Mood and Affect: Mood normal.        Behavior: Behavior normal.    Assessment/Plan: The patient is scheduled for removal of bilateral tissue expanders with Dr. Lovena Le.  Risks, benefits, and alternatives of procedure discussed, questions answered and consent obtained.    Smoking Status: Non-smoker; Counseling Given?  N/A  Caprini Score: 8; Risk Factors include: Age, history of malignancy, swollen legs and length of planned surgery. Recommendation for mechanical and possible pharmacological prophylaxis. Encourage early ambulation. I will discuss the possibility of pharmacological prophylaxis with Dr. Lovena Le.  Pictures obtained: 12/17/2022  Post-op Rx sent to pharmacy:  Norco, Zofran  I instructed the patient to hold her Xanax and Robaxin the day of surgery.  Patient reports she is no longer taking Valium, gabapentin, Norco, Toradol or tramadol.  I instructed the patient to hold any herbal teas, dietary supplements or vitamins 1 week prior to surgery.  Patient expressed understanding.  Patient was provided with the General Surgical Risk consent document and Pain Medication Agreement prior to their appointment.  They had adequate time to read through the risk consent documents and Pain Medication Agreement. We also discussed them in person together during this preop appointment. All of their questions were answered to their satisfaction.  Recommended calling if they have any further questions.  Risk consent form and Pain Medication Agreement to be scanned into patient's chart.  The consent was obtained  with risks and complications  reviewed which included bleeding, pain, scar, infection and the risk of anesthesia.  The patients questions were answered to the patients expressed satisfaction.    Electronically signed by: [name], PA-C 12/30/2022 3:45 PM

## 2022-12-30 ENCOUNTER — Ambulatory Visit (INDEPENDENT_AMBULATORY_CARE_PROVIDER_SITE_OTHER): Payer: 59 | Admitting: Student

## 2022-12-30 VITALS — BP 137/78 | HR 96

## 2022-12-30 DIAGNOSIS — Z0289 Encounter for other administrative examinations: Secondary | ICD-10-CM

## 2022-12-30 DIAGNOSIS — C50911 Malignant neoplasm of unspecified site of right female breast: Secondary | ICD-10-CM

## 2022-12-30 MED ORDER — ONDANSETRON HCL 4 MG PO TABS: 4.0000 mg | ORAL_TABLET | Freq: Three times a day (TID) | ORAL | 0 refills | Status: DC | PRN

## 2022-12-30 MED ORDER — HYDROCODONE-ACETAMINOPHEN 5-325 MG PO TABS: 1.0000 | ORAL_TABLET | Freq: Three times a day (TID) | ORAL | 0 refills | Status: DC | PRN

## 2022-12-31 ENCOUNTER — Ambulatory Visit: Payer: 59

## 2023-01-04 ENCOUNTER — Other Ambulatory Visit: Payer: Self-pay

## 2023-01-04 ENCOUNTER — Encounter (HOSPITAL_BASED_OUTPATIENT_CLINIC_OR_DEPARTMENT_OTHER): Payer: Self-pay | Admitting: Plastic Surgery

## 2023-01-05 ENCOUNTER — Ambulatory Visit: Payer: 59

## 2023-01-05 ENCOUNTER — Encounter (HOSPITAL_BASED_OUTPATIENT_CLINIC_OR_DEPARTMENT_OTHER)
Admission: RE | Admit: 2023-01-05 | Discharge: 2023-01-05 | Disposition: A | Discharge status: Home / Self Care | Payer: 59 | Source: Ambulatory Visit | Attending: Plastic Surgery | Admitting: Plastic Surgery

## 2023-01-05 ENCOUNTER — Other Ambulatory Visit: Payer: Self-pay

## 2023-01-05 DIAGNOSIS — M25612 Stiffness of left shoulder, not elsewhere classified: Secondary | ICD-10-CM

## 2023-01-05 DIAGNOSIS — R293 Abnormal posture: Secondary | ICD-10-CM

## 2023-01-05 DIAGNOSIS — Z01812 Encounter for preprocedural laboratory examination: Secondary | ICD-10-CM | POA: Insufficient documentation

## 2023-01-05 DIAGNOSIS — Z170 Estrogen receptor positive status [ER+]: Secondary | ICD-10-CM

## 2023-01-05 DIAGNOSIS — M25611 Stiffness of right shoulder, not elsewhere classified: Secondary | ICD-10-CM

## 2023-01-05 DIAGNOSIS — C50411 Malignant neoplasm of upper-outer quadrant of right female breast: Secondary | ICD-10-CM | POA: Diagnosis not present

## 2023-01-05 NOTE — Therapy (Signed)
OUTPATIENT PHYSICAL THERAPY BREAST CANCER TREATMENT   Patient Name: Elizabeth Benton MRN: [id number] DOB:1946/05/23, 77 y.o., female Today's Date: 01/05/2023  END OF SESSION:  PT End of Session - 01/05/23 1000     Visit Number 8    Number of Visits 14    Date for PT Re-Evaluation 01/13/23    PT Start Time 1001    PT Stop Time 1058    PT Time Calculation (min) 57 min    Activity Tolerance Patient tolerated treatment well    Behavior During Therapy [facility] for tasks assessed/performed             Past Medical History:  Diagnosis Date   Arthritis    Breast cancer ([place])    Depression    H/O migraine    HSV-2 (herpes simplex virus 2) infection    Hyperlipidemia    Hypertension    Lichen sclerosus    Migraine    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   Vaginal atrophy 12/29/2007   Vaginitis and vulvovaginitis 12/28/2004   Past Surgical History:  Procedure Laterality Date   ABDOMINAL HYSTERECTOMY  1987   BREAST RECONSTRUCTION WITH PLACEMENT OF TISSUE EXPANDER AND FLEX HD (ACELLULAR HYDRATED DERMIS) Bilateral 10/20/2022   Procedure: BREAST RECONSTRUCTION WITH PLACEMENT OF TISSUE EXPANDER AND FLEX HD (ACELLULAR HYDRATED DERMIS);  Surgeon: Lennice Sites, MD;  Location: [place];  Service: Plastics;  Laterality: Bilateral;   DILATION AND CURETTAGE OF UTERUS     MASTECTOMY W/ SENTINEL NODE BIOPSY Right 10/20/2022   Procedure: RIGHT MASTECTOMY WITH RIGHT SENTINEL LYMPH NODE BIOPSY;  Surgeon: Stark Klein, MD;  Location: [place];  Service: General;  Laterality: Right;   TONSILLECTOMY     TOTAL MASTECTOMY Left 10/20/2022   Procedure: LEFT TOTAL MASTECTOMY;  Surgeon: Stark Klein, MD;  Location: [place];  Service: General;  Laterality: Left;   TUBAL LIGATION     WISDOM TOOTH EXTRACTION     Patient Active Problem List   Diagnosis Date Noted   Breast cancer ([place]) 10/20/2022   S/P breast reconstruction 10/20/2022   Breast cancer of upper-outer quadrant of right female breast ([place]) 10/20/2022    Genetic testing 08/24/2022   Family history of breast cancer 08/06/2022   Family history of prostate cancer 08/06/2022   Malignant neoplasm of upper-outer quadrant of right breast in female, estrogen receptor positive ([place]) 07/31/2022   Abnormal glucose level 12/05/2020   Constipation 12/05/2020   Lumbago with sciatica 12/05/2020   Osteopenia 12/05/2020   Overweight 12/05/2020   Screening for malignant neoplasm of colon 12/05/2020   Vitamin D deficiency 12/05/2020   Essential hypertension 11/30/2019   LBBB (left bundle branch block) 11/30/2019   Exertional dyspnea 30/16/0109   Lichen sclerosus 32/35/5732   Mixed hyperlipidemia    HSV-2 (herpes simplex virus 2) infection    Depression    H/O migraine    Monilia infection    Vaginal atrophy    Wears glasses 06/23/2011   Sinus problem/runny nose 06/23/2011   Mass on back 06/23/2011    PCP: Willey Blade, MD  REFERRING PROVIDER: Nicholas Lose, MD  REFERRING DIAG: Right Breast Cancer  THERAPY DIAG:  Malignant neoplasm of upper-outer quadrant of right breast in female, estrogen receptor positive ([place])  Abnormal posture  Stiffness of left shoulder, not elsewhere classified  Stiffness of right shoulder, not elsewhere classified  Rationale for Evaluation and Treatment: Rehabilitation  ONSET DATE: 07/24/2022  SUBJECTIVE:  SUBJECTIVE STATEMENT Things are good. I am excited about Friday. The right expander is still very uncomfortable.   PERTINENT HISTORY:  Patient was diagnosed on 07/24/2022 with right grade 2 Invasive Lobular Carcinoma. It measures 1.2 cm and is located in the upper-outer quadrant. It is ER 95%, PR 80% and HER 2 negative with a Ki67 of 40%. She is s/p a Bilateral Mastectomy on 10/20/2022  for Right Breast Cancer and with left  prophylactic and bilateral tissue expanders .  She has since decided that she wants the expanders removed and this will happen in January. Drains were removed on 11/23/2022,11/26/2022 and she has been told to continue compression and use vaseline over incisions. She has returned to work at home.  PATIENT GOALS:  Reassess how my recovery is going related to arm function, pain, and swelling.  PAIN:  Are you having pain?  PAIN:  Are you having pain? Yes mostly soreness NPRS scale: 1-2/10 discomfort. Pain location: right breast from expander, new swelling Pain orientation: Right  PAIN TYPE: tight and heavy Pain description: constant  Aggravating factors: expander Relieving factors: pain meds help some   PRECAUTIONS: Recent Surgery, right UE Lymphedema risk, DDD, LBP  ACTIVITY LEVEL / LEISURE: nothing yet, returned to work   OBJECTIVE:   PATIENT SURVEYS:  QUICK DASH: 61.36  OBSERVATIONS: Pt with larger right breast  than secondary to expander filled more. Mastectomy incisions are healed with glue still present. Left axillary region with area of swelling/tissue greater than right. Pt will be having an [place] today on the left. Having expanders removed in early January.  POSTURE:  Forward head, rounded shoulders  LYMPHEDEMA ASSESSMENT:     UPPER EXTREMITY AROM/PROM:   A/PROM RIGHT   eval   Right 12/02/2022 RIGHT 12/29/2022 RIGHT 01/05/2023  Shoulder extension 60 49 63   Shoulder flexion 141 104 147 150  Shoulder abduction 168 68 145 149  Shoulder internal rotation 55     Shoulder external rotation 112                             (Blank rows = not tested)   A/PROM LEFT   eval LEFT 2023 LEFT 12/29/2022  Shoulder extension 58 46 65  Shoulder flexion 159 120 160  Shoulder abduction 165 90 156  Shoulder internal rotation 65    Shoulder external rotation 112                            (Blank rows = not tested)     CERVICAL AROM: All within functional  limits: Decreased 20 %  Rotation and 50% SB         UPPER EXTREMITY STRENGTH: WNL     LYMPHEDEMA ASSESSMENTS:    LANDMARK RIGHT   eval Right 12/02/2022  10 cm proximal to olecranon process 28.5 28.7  Olecranon process 24.6 24.6  10 cm proximal to ulnar styloid process 21.8 21.7  Just proximal to ulnar styloid process 16.4 16.3  Across hand at thumb web space 18.3 18.2  At base of 2nd digit 6.5 6.6  (Blank rows = not tested)   LANDMARK LEFT   eval LEFT 12/02/2022  10 cm proximal to olecranon process 29.3 29.7  Olecranon process 24.9 24.7  10 cm proximal to ulnar styloid process 21.0 21.4  Just proximal to ulnar styloid process 16.3 16.3  Across hand at thumb web space 18.0 17.9  At base of 2nd digit 6.45 6.4  (Blank rows = not tested)      Surgery type/Date: 10/20/2022 bilateral Mastectomy with right SLNB Number of lymph nodes removed: 0/2 right Current/past treatment (chemo, radiation, hormone therapy): No chemo required, will start anti-estrogen therapy Other symptoms:  Heaviness/tightness Yes Pain Yes Pitting edema No Infections No Decreased scar mobility Yes Stemmer sign No  TREATMENT TODAY  01/05/2022 Pulleys x 2 min flexion and abduction, 1 min scaption Soft Tissue mobilization to bilateralUT, pectorals and Lats with cocoa butter in supine PROM bilateral shoulder flexion, scaption, abd, IR and ER with only occasional VC's to relax AROM bilateral flexion, scaption, horizontal abduction x 5 4 D ball rolls bilaterally x 10 ea 12/29/2022 Pulleys x 2 min ea for shoulder Flexion and abduction Soft Tissue mobilization to right UT, pectorals and Lats with cocoa butter in supine PROM bilateral shoulder flexion, scaption, abd, IR and ER with only occasional VC's to relax In supine: Short neck, 5 diaphragmatic breaths, L axillary nodes and establishment of interaxillary pathway, R inguinal nodes and establishment of axilloinguinal pathway, then R breast moving fluid towards pathways  then  retracing all steps and ending with LN's Measured ROM  12/17/2022 Soft Tissue mobilization to bilateral UT, pectorals and Lats with cocoa butter in supine PROM bilateral shoulder flexion, scaption, abd, IR and ER with only occasional VC's to relax Supine AROM bilateral shoulder flexion,scaption, horizontal abd in comfortable ROM x 5 ea Gentle MFR right lower ribs during AROM for scation to ease discomfort Supine wand flexion and scaption x4 ea In supine: Short neck, 5 diaphragmatic breaths, L axillary nodes and establishment of interaxillary pathway, R inguinal nodes and establishment of axilloinguinal pathway, then R breast moving fluid towards pathways  then retracing all steps and ending with LN's Assisted pt up from table. Updated HEP with wand exercises   12/15/2022  Soft Tissue mobilization to bilateral UT, pectorals and Lats with cocoa butter in supine PROM bilateral shoulder flexion, scaption, abd, IR and ER with fewer VC's to relax Supine AROM bilateral shoulder flexion,scaption, horizontal abd in comfortable ROM x 5 ea In supine: Short neck, 5 diaphragmatic breaths, L axillary nodes and establishment of interaxillary pathway, R inguinal nodes and establishment of axilloinguinal pathway, then R breast moving fluid towards pathways  then retracing all steps and ending with LN's Assisted pt up from table.    12/10/2022 Soft Tissue mobilization to bilateral UT, pectorals and Lats with cocoa butter in supine PROM bilateral shoulder flexion, scaption, abd, IR and ER with multiple VC's to relax Supine AROM bilateral shoulder flexion,scaption, horizontal abd in comfortable ROM x 5 ea  12/08/2022 Soft Tissue mobilization to bilateral UT, pectorals and Lats with cocoa butter in supine AAROM clasped hands flexion and stargazer x 5, wall slides x 5 PROM bilateral shoulder flexion, scaption, abd, IR and ER with multiple VC's to relax Tried supine wand x 2 but pt had trouble  relaxing. Lower trunk rotation to the left x 4 to stretch lats/pecs gently   PATIENT EDUCATION:   Education details: Reviewed 4 post op exercises. Did better with abduction sliding hand on wall Person educated: Patient Education method: Explanation and Demonstration Education comprehension: returned demonstration    HOME EXERCISE PROGRAM: Reviewed previously given post op HEP. Changed wall walk to wall slide and pt demonstrated good improvement  ASSESSMENT:  CLINICAL IMPRESSION: Pt made slight improvement in ROM this week. She fatigued very quickly with ball rolls on wall. She is having her expanders  removed on Friday and would like to return after that surgery. At that time we will continue to progress ROM and start adding more strengthening.  Pt will benefit from skilled therapeutic intervention to improve on the following deficits: Decreased knowledge of precautions, impaired UE functional use, pain, decreased ROM, postural dysfunction.   PT treatment/interventions: ADL/Self care home management, Therapeutic exercises, Therapeutic activity, Neuromuscular re-education, Patient/Family education, Self Care, Joint mobilization, Orthotic/Fit training, Dry Needling, Manual lymph drainage, scar mobilization, Manual therapy, and Re-evaluation   GOALS: Goals reviewed with patient? Yes  LONG TERM GOALS:  (STG=LTG)  GOALS Name Target Date  Goal status  1 Pt will demonstrate she has regained full shoulder ROM and function post operatively compared to baselines.  Baseline: 01/13/2023 INITIAL  2 Pts quick dash will be improved to pre-surgery level of 36% 11/14/2023 INITIAL  3 Pt will be able to dress and bathe and perform light household chores independently 01/13/2023 MET 12/29/2022  4 Pt will have decreased pain by 50% overall 01/13/2023 MET 12/29/2022     PLAN:  PT FREQUENCY/DURATION: 2x/week x 6 weeks  PLAN FOR NEXT SESSION: STM bilateral UQ, AAROM, PROM bilateral shoulders, MLD prn  especially to left axillary region   [facility]  [address], [address]  [place] [address]  [phone]  After Breast Cancer Class It is recommended you attend the ABC class to be educated on lymphedema risk reduction. This class is free of charge and lasts for 1 hour. It is a 1-time class. You will need to download the Webex app either on your phone or computer. We will send you a link the night before or the morning of the class. You should be able to click on that link to join the class. This is not a confidential class. You don't have to turn your camera on, but other participants may be able to see your email address.  Scar massage You can begin gentle scar massage to you incision sites. Gently place one hand on the incision and move the skin (without sliding on the skin) in various directions. Do this for a few minutes and then you can gently massage either coconut oil or vitamin E cream into the scars.  Compression garment You should continue wearing your compression bra until you feel like you no longer have swelling.  Home exercise Program Continue doing the exercises you were given until you feel like you can do them without feeling any tightness at the end.   Walking Program Studies show that 30 minutes of walking per day (fast enough to elevate your heart rate) can significantly reduce the risk of a cancer recurrence. If you can't walk due to other medical reasons, we encourage you to find another activity you could do (like a stationary bike or water exercise).  Posture After breast cancer surgery, people frequently sit with rounded shoulders posture because it puts their incisions on slack and feels better. If you sit like this and scar tissue forms in that position, you can become very tight and have pain sitting or standing with good posture. Try to be aware of your posture and sit and stand up tall to heal properly.  Follow up PT: It is  recommended you return every 3 months for the first 3 years following surgery to be assessed on the SOZO machine for an L-Dex score. This helps prevent clinically significant lymphedema in 95% of patients. These follow up screens are 10 minute appointments that you are  not billed for.  Claris Pong, PT 01/05/2023, 11:00 AM

## 2023-01-05 NOTE — Progress Notes (Signed)

## 2023-01-05 NOTE — Progress Notes (Signed)
EKG  reviewed by Dr. Kalman Shan, will proceed with surgery as scheduled.

## 2023-01-06 ENCOUNTER — Other Ambulatory Visit: Payer: Self-pay | Admitting: Adult Health

## 2023-01-06 DIAGNOSIS — Z170 Estrogen receptor positive status [ER+]: Secondary | ICD-10-CM

## 2023-01-07 ENCOUNTER — Ambulatory Visit: Payer: 59

## 2023-01-07 NOTE — Anesthesia Preprocedure Evaluation (Addendum)
Anesthesia Evaluation  Patient identified by MRN, date of birth, ID band Patient awake    Reviewed: Allergy & Precautions, NPO status , Patient's Chart, lab work & pertinent test results, Unable to perform ROS - Chart review only  History of Anesthesia Complications Negative for: history of anesthetic complications  Airway Mallampati: II  TM Distance: >3 FB Neck ROM: Full    Dental no notable dental hx. (+) Dental Advisory Given   Pulmonary former smoker   Pulmonary exam normal        Cardiovascular hypertension, Pt. on medications (-) CHF Normal cardiovascular exam  Echocardiogram 03/20/2020: Left ventricle cavity is normal in size. Mild concentric hypertrophy of the left ventricle. Abnormal septal wall motion due to left bundle branch block. Normal LV systolic function with EF 56%. Doppler evidence of grade I (impaired) diastolic dysfunction, normal LAP. Mild (Grade I) mitral regurgitation. Mild tricuspid regurgitation. No evidence of pulmonary hypertension.     Neuro/Psych  Headaches PSYCHIATRIC DISORDERS  Depression     Neuromuscular disease    GI/Hepatic negative GI ROS, Neg liver ROS,,,  Endo/Other  negative endocrine ROS    Renal/GU negative Renal ROS     Musculoskeletal  (+) Arthritis ,    Abdominal   Peds  Hematology negative hematology ROS (+) Lab Results      Component                Value               Date                      WBC                      6.6                 10/13/2022                HGB                      13.7                10/13/2022                HCT                      42.2                10/13/2022                MCV                      86.7                10/13/2022                PLT                      233                 10/13/2022              Anesthesia Other Findings RIGHT BREAST CANCER  Reproductive/Obstetrics                              Anesthesia Physical Anesthesia Plan  ASA: 2  Anesthesia Plan: General  Post-op Pain Management: Tylenol PO (pre-op)* and Toradol IV (intra-op)*   Induction:   PONV Risk Score and Plan: 3 and Ondansetron, Dexamethasone, Propofol infusion and Midazolam  Airway Management Planned: Oral ETT  Additional Equipment: None  Intra-op Plan:   Post-operative Plan: Extubation in OR  Informed Consent: I have reviewed the patients History and Physical, chart, labs and discussed the procedure including the risks, benefits and alternatives for the proposed anesthesia with the patient or authorized representative who has indicated his/her understanding and acceptance.     Dental advisory given  Plan Discussed with: Anesthesiologist and CRNA  Anesthesia Plan Comments:         Anesthesia Quick Evaluation

## 2023-01-08 ENCOUNTER — Other Ambulatory Visit: Payer: Self-pay

## 2023-01-08 ENCOUNTER — Encounter (HOSPITAL_BASED_OUTPATIENT_CLINIC_OR_DEPARTMENT_OTHER)
Admission: RE | Disposition: A | Discharge status: Home / Self Care | Payer: Self-pay | Source: Home / Self Care | Attending: Plastic Surgery

## 2023-01-08 ENCOUNTER — Encounter (HOSPITAL_BASED_OUTPATIENT_CLINIC_OR_DEPARTMENT_OTHER): Payer: Self-pay | Admitting: Plastic Surgery

## 2023-01-08 ENCOUNTER — Ambulatory Visit (HOSPITAL_BASED_OUTPATIENT_CLINIC_OR_DEPARTMENT_OTHER): Payer: 59 | Admitting: Anesthesiology

## 2023-01-08 ENCOUNTER — Ambulatory Visit (HOSPITAL_BASED_OUTPATIENT_CLINIC_OR_DEPARTMENT_OTHER)
Admission: RE | Admit: 2023-01-08 | Discharge: 2023-01-08 | Disposition: A | Discharge status: Home / Self Care | Payer: 59 | Attending: Plastic Surgery | Admitting: Plastic Surgery

## 2023-01-08 DIAGNOSIS — Z01818 Encounter for other preprocedural examination: Secondary | ICD-10-CM

## 2023-01-08 DIAGNOSIS — I10 Essential (primary) hypertension: Secondary | ICD-10-CM | POA: Insufficient documentation

## 2023-01-08 DIAGNOSIS — Z8042 Family history of malignant neoplasm of prostate: Secondary | ICD-10-CM | POA: Insufficient documentation

## 2023-01-08 DIAGNOSIS — T8549XA Other mechanical complication of breast prosthesis and implant, initial encounter: Secondary | ICD-10-CM

## 2023-01-08 DIAGNOSIS — Z853 Personal history of malignant neoplasm of breast: Secondary | ICD-10-CM | POA: Insufficient documentation

## 2023-01-08 DIAGNOSIS — Z808 Family history of malignant neoplasm of other organs or systems: Secondary | ICD-10-CM | POA: Diagnosis not present

## 2023-01-08 DIAGNOSIS — C50411 Malignant neoplasm of upper-outer quadrant of right female breast: Secondary | ICD-10-CM

## 2023-01-08 DIAGNOSIS — C50919 Malignant neoplasm of unspecified site of unspecified female breast: Secondary | ICD-10-CM | POA: Diagnosis not present

## 2023-01-08 DIAGNOSIS — Z9013 Acquired absence of bilateral breasts and nipples: Secondary | ICD-10-CM | POA: Insufficient documentation

## 2023-01-08 DIAGNOSIS — Z800 Family history of malignant neoplasm of digestive organs: Secondary | ICD-10-CM | POA: Diagnosis not present

## 2023-01-08 DIAGNOSIS — Z421 Encounter for breast reconstruction following mastectomy: Secondary | ICD-10-CM | POA: Diagnosis not present

## 2023-01-08 DIAGNOSIS — Z803 Family history of malignant neoplasm of breast: Secondary | ICD-10-CM | POA: Diagnosis not present

## 2023-01-08 DIAGNOSIS — Z87891 Personal history of nicotine dependence: Secondary | ICD-10-CM | POA: Diagnosis not present

## 2023-01-08 HISTORY — PX: REMOVAL OF BILATERAL TISSUE EXPANDERS WITH PLACEMENT OF BILATERAL BREAST IMPLANTS: SHX6431

## 2023-01-08 SURGERY — REMOVAL, TISSUE EXPANDER, BREAST, BILATERAL, WITH BILATERAL IMPLANT IMPLANT INSERTION
Anesthesia: General | Site: Breast | Laterality: Bilateral

## 2023-01-08 MED ORDER — EPHEDRINE SULFATE (PRESSORS) 50 MG/ML IJ SOLN
INTRAMUSCULAR | Status: DC | PRN
  Administered 2023-01-08: 10 mg via INTRAVENOUS
  Administered 2023-01-08 (×2): 5 mg via INTRAVENOUS

## 2023-01-08 MED ORDER — ACETAMINOPHEN 500 MG PO TABS
ORAL_TABLET | ORAL | Status: AC
  Filled 2023-01-08: qty 2

## 2023-01-08 MED ORDER — PROPOFOL 10 MG/ML IV BOLUS
INTRAVENOUS | Status: DC | PRN
  Administered 2023-01-08: 30 mg via INTRAVENOUS
  Administered 2023-01-08: 150 mg via INTRAVENOUS
  Administered 2023-01-08: 20 mg via INTRAVENOUS

## 2023-01-08 MED ORDER — CEFAZOLIN SODIUM-DEXTROSE 2-4 GM/100ML-% IV SOLN
INTRAVENOUS | Status: AC
  Filled 2023-01-08: qty 100

## 2023-01-08 MED ORDER — LIDOCAINE 2% (20 MG/ML) 5 ML SYRINGE
INTRAMUSCULAR | Status: DC | PRN
  Administered 2023-01-08: 100 mg via INTRAVENOUS

## 2023-01-08 MED ORDER — FENTANYL CITRATE (PF) 100 MCG/2ML IJ SOLN
INTRAMUSCULAR | Status: DC | PRN
  Administered 2023-01-08: 50 ug via INTRAVENOUS
  Administered 2023-01-08: 100 ug via INTRAVENOUS

## 2023-01-08 MED ORDER — ONDANSETRON HCL 4 MG/2ML IJ SOLN
INTRAMUSCULAR | Status: DC | PRN
  Administered 2023-01-08: 4 mg via INTRAVENOUS

## 2023-01-08 MED ORDER — FENTANYL CITRATE (PF) 100 MCG/2ML IJ SOLN
INTRAMUSCULAR | Status: AC
  Filled 2023-01-08: qty 2

## 2023-01-08 MED ORDER — AMISULPRIDE (ANTIEMETIC) 5 MG/2ML IV SOLN
10.0000 mg | Freq: Once | INTRAVENOUS | Status: AC | PRN
  Administered 2023-01-08: 10 mg via INTRAVENOUS

## 2023-01-08 MED ORDER — LIDOCAINE 2% (20 MG/ML) 5 ML SYRINGE
INTRAMUSCULAR | Status: AC
  Filled 2023-01-08: qty 5

## 2023-01-08 MED ORDER — ACETAMINOPHEN 500 MG PO TABS
1000.0000 mg | ORAL_TABLET | Freq: Once | ORAL | Status: AC
  Administered 2023-01-08: 1000 mg via ORAL

## 2023-01-08 MED ORDER — AMISULPRIDE (ANTIEMETIC) 5 MG/2ML IV SOLN
INTRAVENOUS | Status: AC
  Filled 2023-01-08: qty 4

## 2023-01-08 MED ORDER — 0.9 % SODIUM CHLORIDE (POUR BTL) OPTIME
TOPICAL | Status: DC | PRN
  Administered 2023-01-08: 400 mL

## 2023-01-08 MED ORDER — CHLORHEXIDINE GLUCONATE CLOTH 2 % EX PADS: 6.0000 | MEDICATED_PAD | Freq: Once | CUTANEOUS | Status: DC

## 2023-01-08 MED ORDER — PROMETHAZINE HCL 25 MG/ML IJ SOLN: 6.2500 mg | INTRAMUSCULAR | Status: DC | PRN

## 2023-01-08 MED ORDER — PHENYLEPHRINE HCL (PRESSORS) 10 MG/ML IV SOLN
INTRAVENOUS | Status: DC | PRN
  Administered 2023-01-08: 80 ug via INTRAVENOUS
  Administered 2023-01-08 (×3): 160 ug via INTRAVENOUS

## 2023-01-08 MED ORDER — ROCURONIUM BROMIDE 10 MG/ML (PF) SYRINGE
PREFILLED_SYRINGE | INTRAVENOUS | Status: AC
  Filled 2023-01-08: qty 10

## 2023-01-08 MED ORDER — EPHEDRINE 5 MG/ML INJ
INTRAVENOUS | Status: AC
  Filled 2023-01-08: qty 10

## 2023-01-08 MED ORDER — PROPOFOL 10 MG/ML IV BOLUS
INTRAVENOUS | Status: AC
  Filled 2023-01-08: qty 20

## 2023-01-08 MED ORDER — LACTATED RINGERS IV SOLN: INTRAVENOUS | Status: DC

## 2023-01-08 MED ORDER — ROCURONIUM BROMIDE 100 MG/10ML IV SOLN
INTRAVENOUS | Status: DC | PRN
  Administered 2023-01-08: 70 mg via INTRAVENOUS

## 2023-01-08 MED ORDER — FENTANYL CITRATE (PF) 100 MCG/2ML IJ SOLN
25.0000 ug | INTRAMUSCULAR | Status: DC | PRN
  Administered 2023-01-08: 50 ug via INTRAVENOUS
  Administered 2023-01-08 (×2): 25 ug via INTRAVENOUS
  Administered 2023-01-08: 50 ug via INTRAVENOUS

## 2023-01-08 MED ORDER — SUGAMMADEX SODIUM 200 MG/2ML IV SOLN
INTRAVENOUS | Status: DC | PRN
  Administered 2023-01-08: 200 mg via INTRAVENOUS

## 2023-01-08 MED ORDER — CEFAZOLIN SODIUM-DEXTROSE 2-4 GM/100ML-% IV SOLN
2.0000 g | INTRAVENOUS | Status: AC
  Administered 2023-01-08: 2 g via INTRAVENOUS

## 2023-01-08 MED ORDER — DEXAMETHASONE SODIUM PHOSPHATE 10 MG/ML IJ SOLN
INTRAMUSCULAR | Status: DC | PRN
  Administered 2023-01-08: 5 mg via INTRAVENOUS

## 2023-01-08 MED ORDER — ONDANSETRON HCL 4 MG/2ML IJ SOLN
INTRAMUSCULAR | Status: AC
  Filled 2023-01-08: qty 2

## 2023-01-08 MED ORDER — DEXAMETHASONE SODIUM PHOSPHATE 10 MG/ML IJ SOLN
INTRAMUSCULAR | Status: AC
  Filled 2023-01-08: qty 1

## 2023-01-08 MED ORDER — BUPIVACAINE HCL (PF) 0.25 % IJ SOLN
INTRAMUSCULAR | Status: AC
  Filled 2023-01-08: qty 30

## 2023-01-08 MED ORDER — PHENYLEPHRINE 80 MCG/ML (10ML) SYRINGE FOR IV PUSH (FOR BLOOD PRESSURE SUPPORT)
PREFILLED_SYRINGE | INTRAVENOUS | Status: AC
  Filled 2023-01-08: qty 10

## 2023-01-08 SURGICAL SUPPLY — 77 items
ADH SKN CLS APL DERMABOND .7 (GAUZE/BANDAGES/DRESSINGS) ×2
APL PRP STRL LF DISP 70% ISPRP (MISCELLANEOUS) ×2
BAG DECANTER FOR FLEXI CONT (MISCELLANEOUS) ×1 IMPLANT
BINDER BREAST LRG (GAUZE/BANDAGES/DRESSINGS) IMPLANT
BINDER BREAST MEDIUM (GAUZE/BANDAGES/DRESSINGS) IMPLANT
BINDER BREAST XLRG (GAUZE/BANDAGES/DRESSINGS) IMPLANT
BINDER BREAST XXLRG (GAUZE/BANDAGES/DRESSINGS) IMPLANT
BIOPATCH RED 1 DISK 7.0 (GAUZE/BANDAGES/DRESSINGS) IMPLANT
BLADE HEX COATED 2.75 (ELECTRODE) ×1 IMPLANT
BLADE SURG 10 STRL SS (BLADE) IMPLANT
BLADE SURG 15 STRL LF DISP TIS (BLADE) ×1 IMPLANT
BLADE SURG 15 STRL SS (BLADE) ×1
BNDG GAUZE DERMACEA FLUFF 4 (GAUZE/BANDAGES/DRESSINGS) ×2 IMPLANT
BNDG GZE DERMACEA 4 6PLY (GAUZE/BANDAGES/DRESSINGS)
CANISTER SUCT 1200ML W/VALVE (MISCELLANEOUS) ×1 IMPLANT
CHLORAPREP W/TINT 26 (MISCELLANEOUS) IMPLANT
COVER BACK TABLE 60X90IN (DRAPES) IMPLANT
COVER MAYO STAND STRL (DRAPES) ×1 IMPLANT
DERMABOND ADVANCED .7 DNX12 (GAUZE/BANDAGES/DRESSINGS) IMPLANT
DRAIN CHANNEL 19F RND (DRAIN) IMPLANT
DRSG TEGADERM 4X4.75 (GAUZE/BANDAGES/DRESSINGS) IMPLANT
ELECT BLADE 4.0 EZ CLEAN MEGAD (MISCELLANEOUS)
ELECT BLADE 6.5 EXT (BLADE) IMPLANT
ELECT REM PT RETURN 9FT ADLT (ELECTROSURGICAL) ×1
ELECTRODE BLDE 4.0 EZ CLN MEGD (MISCELLANEOUS) IMPLANT
ELECTRODE REM PT RTRN 9FT ADLT (ELECTROSURGICAL) ×1 IMPLANT
EVACUATOR SILICONE 100CC (DRAIN) IMPLANT
GAUZE PAD ABD 8X10 STRL (GAUZE/BANDAGES/DRESSINGS) ×1 IMPLANT
GLOVE BIO SURGEON STRL SZ 6.5 (GLOVE) IMPLANT
GLOVE BIO SURGEON STRL SZ8 (GLOVE) ×2 IMPLANT
GLOVE BIOGEL M STRL SZ7.5 (GLOVE) IMPLANT
GLOVE BIOGEL PI IND STRL 7.0 (GLOVE) ×1 IMPLANT
GLOVE BIOGEL PI IND STRL 7.5 (GLOVE) IMPLANT
GLOVE BIOGEL PI IND STRL 8 (GLOVE) ×1 IMPLANT
GOWN STRL REUS W/ TWL LRG LVL3 (GOWN DISPOSABLE) ×1 IMPLANT
GOWN STRL REUS W/ TWL XL LVL3 (GOWN DISPOSABLE) ×1 IMPLANT
GOWN STRL REUS W/TWL LRG LVL3 (GOWN DISPOSABLE)
GOWN STRL REUS W/TWL XL LVL3 (GOWN DISPOSABLE) ×3
HEMOSTAT ARISTA ABSORB 3G PWDR (HEMOSTASIS) IMPLANT
IV NS 1000ML (IV SOLUTION)
IV NS 1000ML BAXH (IV SOLUTION) IMPLANT
IV NS 500ML (IV SOLUTION)
IV NS 500ML BAXH (IV SOLUTION) IMPLANT
KIT FILL ASEPTIC TRANSFER (MISCELLANEOUS) IMPLANT
NDL HYPO 25X1 1.5 SAFETY (NEEDLE) IMPLANT
NDL SAFETY ECLIP 18X1.5 (MISCELLANEOUS) IMPLANT
NEEDLE HYPO 25X1 1.5 SAFETY (NEEDLE) ×1 IMPLANT
NS IRRIG 1000ML POUR BTL (IV SOLUTION) IMPLANT
PACK BASIN DAY SURGERY FS (CUSTOM PROCEDURE TRAY) ×1 IMPLANT
PACK UNIVERSAL I (CUSTOM PROCEDURE TRAY) ×1 IMPLANT
PENCIL SMOKE EVACUATOR (MISCELLANEOUS) ×1 IMPLANT
PIN SAFETY STERILE (MISCELLANEOUS) IMPLANT
SLEEVE SCD COMPRESS KNEE MED (STOCKING) ×1 IMPLANT
SPIKE FLUID TRANSFER (MISCELLANEOUS) IMPLANT
SPONGE T-LAP 18X18 ~~LOC~~+RFID (SPONGE) ×2 IMPLANT
STAPLER VISISTAT 35W (STAPLE) IMPLANT
STRIP SUTURE WOUND CLOSURE 1/2 (MISCELLANEOUS) IMPLANT
SUT MNCRL AB 4-0 PS2 18 (SUTURE) IMPLANT
SUT MON AB 3-0 SH 27 (SUTURE) ×4
SUT MON AB 3-0 SH27 (SUTURE) IMPLANT
SUT MON AB 5-0 PS2 18 (SUTURE) IMPLANT
SUT PDS AB 2-0 CT2 27 (SUTURE) IMPLANT
SUT PROLENE 3 0 PS 2 (SUTURE) IMPLANT
SUT SILK 3 0 PS 1 (SUTURE) IMPLANT
SUT VIC AB 3-0 SH 27 (SUTURE)
SUT VIC AB 3-0 SH 27X BRD (SUTURE) IMPLANT
SUT VICRYL 4-0 PS2 18IN ABS (SUTURE) IMPLANT
SWAB COLLECTION DEVICE MRSA (MISCELLANEOUS) IMPLANT
SWAB CULTURE ESWAB REG 1ML (MISCELLANEOUS) IMPLANT
SYR 50ML LL SCALE MARK (SYRINGE) IMPLANT
SYR BULB IRRIG 60ML STRL (SYRINGE) ×1 IMPLANT
SYR CONTROL 10ML LL (SYRINGE) IMPLANT
TOWEL GREEN STERILE FF (TOWEL DISPOSABLE) ×2 IMPLANT
TRAY DSU PREP LF (CUSTOM PROCEDURE TRAY) IMPLANT
TUBE CONNECTING 20X1/4 (TUBING) ×1 IMPLANT
UNDERPAD 30X36 HEAVY ABSORB (UNDERPADS AND DIAPERS) ×2 IMPLANT
YANKAUER SUCT BULB TIP NO VENT (SUCTIONS) ×1 IMPLANT

## 2023-01-08 NOTE — Interval H&P Note (Signed)
History and Physical Interval Note:  01/08/2023 9:41 AM  Elizabeth Benton  has presented today for surgery, with the diagnosis of Malignant neoplasm of right female breast,.  The various methods of treatment have been discussed with the patient and family. After consideration of risks, benefits and other options for treatment, the patient has consented to  Procedure(s): Removal of bilateral tissue expanders WITHOUT placement of implants (Bilateral) as a surgical intervention.  The patient's history has been reviewed, patient examined, no change in status, stable for surgery.  I have reviewed the patient's chart and labs.  Questions were answered to the patient's satisfaction.     [name]

## 2023-01-08 NOTE — Anesthesia Procedure Notes (Signed)
Procedure Name: Intubation Date/Time: 01/08/2023 10:21 AM  Performed by: Lavonia Dana, CRNAPre-anesthesia Checklist: Patient identified, Emergency Drugs available, Suction available and Patient being monitored Patient Re-evaluated:Patient Re-evaluated prior to induction Oxygen Delivery Method: Circle system utilized Preoxygenation: Pre-oxygenation with 100% oxygen Induction Type: IV induction Ventilation: Mask ventilation without difficulty Laryngoscope Size: Miller and 2 Grade View: Grade II Tube type: Oral Tube size: 7.5 mm Number of attempts: 1 Airway Equipment and Method: Stylet and Bite block Placement Confirmation: ETT inserted through vocal cords under direct vision, positive ETCO2 and breath sounds checked- equal and bilateral Secured at: 22 cm Tube secured with: Tape Dental Injury: Teeth and Oropharynx as per pre-operative assessment  Comments: DLx1 by CRNA with grade II view, ETT passed, immediate recognition of esophageal intubation; ETT removed, DLx1 with Mac 3, due to anterior larynx unable to pass ETT; MD with DLx1 with Sabra Heck 2, immediate recognition of esophageal intubation; ETT removed, DLx1 by MD with Sabra Heck 2, grade II view; ETT passed through cords, placement verified.

## 2023-01-08 NOTE — Interval H&P Note (Signed)
History and Physical Interval Note:  01/08/2023 9:41 AM  Elizabeth Benton  has presented today for surgery, with the diagnosis of Malignant neoplasm of right female breast,.  The various methods of treatment have been discussed with the patient and family. After consideration of risks, benefits and other options for treatment, the patient has consented to  Procedure(s): Removal of bilateral tissue expanders WITHOUT placement of implants (Bilateral) as a surgical intervention.  The patient's history has been reviewed, patient examined, no change in status, stable for surgery.  I have reviewed the patient's chart and labs.  Questions were answered to the patient's satisfaction.     Elizabeth Benton

## 2023-01-08 NOTE — Interval H&P Note (Signed)
History and Physical Interval Note: Will proceed with tissue expander removal at her request.  01/08/2023 9:40 AM  Elizabeth Benton  has presented today for surgery, with the diagnosis of Malignant neoplasm of right female breast,.  The various methods of treatment have been discussed with the patient and family. After consideration of risks, benefits and other options for treatment, the patient has consented to  Procedure(s): Removal of bilateral tissue expanders WITHOUT placement of implants (Bilateral) as a surgical intervention.  The patient's history has been reviewed, patient examined, no change in status, stable for surgery.  I have reviewed the patient's chart and labs.  Questions were answered to the patient's satisfaction.     [name]

## 2023-01-08 NOTE — Transfer of Care (Signed)
Immediate Anesthesia Transfer of Care Note  Patient: Elizabeth Benton  Procedure(s) Performed: Removal of bilateral tissue expanders (Bilateral: Breast)  Patient Location: PACU  Anesthesia Type:General  Level of Consciousness: drowsy  Airway & Oxygen Therapy: Patient Spontanous Breathing and Patient connected to face mask oxygen  Post-op Assessment: Report given to RN and Post -op Vital signs reviewed and stable  Post vital signs: Reviewed and stable  Last Vitals:  Vitals Value Taken Time  BP 139/67 01/08/23 1246  Temp 36.3 C 01/08/23 1245  Pulse 89 01/08/23 1248  Resp 13 01/08/23 1248  SpO2 99 % 01/08/23 1248  Vitals shown include unvalidated device data.  Last Pain:  Vitals:   01/08/23 0835  TempSrc: Oral  PainSc: 0-No pain      Patients Stated Pain Goal: 4 (22/44/97 5300)  Complications: No notable events documented.

## 2023-01-08 NOTE — Anesthesia Postprocedure Evaluation (Signed)
Anesthesia Post Note  Patient: Elizabeth Benton  Procedure(s) Performed: Removal of bilateral tissue expanders (Bilateral: Breast)     Patient location during evaluation: PACU Anesthesia Type: Regional and General Level of consciousness: sedated Pain management: pain level controlled Vital Signs Assessment: post-procedure vital signs reviewed and stable Respiratory status: spontaneous breathing and respiratory function stable Cardiovascular status: stable Postop Assessment: no apparent nausea or vomiting Anesthetic complications: no   No notable events documented.  Last Vitals:  Vitals:   01/08/23 1421 01/08/23 1430  BP:  (!) 143/75  Pulse:  93  Resp:  18  Temp:    SpO2: 93% 93%    Last Pain:  Vitals:   01/08/23 1430  TempSrc:   PainSc: 0-No pain                 Liadan Guizar DANIEL

## 2023-01-08 NOTE — Interval H&P Note (Signed)
History and Physical Interval Note: Pt met in the pre-op holding area, no change in physical exam or indication for surgery. Surgical sites marked with patients concurrence.  Will proceed with removal of tissue expanders at her request,   01/08/2023 9:42 AM  Elizabeth Benton  has presented today for surgery, with the diagnosis of Malignant neoplasm of right female breast,.  The various methods of treatment have been discussed with the patient and family. After consideration of risks, benefits and other options for treatment, the patient has consented to  Procedure(s): Removal of bilateral tissue expanders WITHOUT placement of implants (Bilateral) as a surgical intervention.  The patient's history has been reviewed, patient examined, no change in status, stable for surgery.  I have reviewed the patient's chart and labs.  Questions were answered to the patient's satisfaction.     Elizabeth Benton

## 2023-01-08 NOTE — Discharge Instructions (Addendum)
Activity: Avoid strenuous activity.  No lifting, pushing, or pulling greater than 15 pounds.  Diet: No restrictions.  Try to optimize nutrition with plenty of proteins, fruits, and vegetables to improve healing.   Wound Care: Leave breast binder on for the first week and then you may transition to a front-clipping or front-zipping compression bra.  Sponge bathe only until our visit tomorrow, then we can discuss transition to showering with the emphasis on keeping the surgical site dry.  Replace the ABD pads over incisions with gauze or Maxi pads as needed for incisional drainage.   If you have drains placed, be sure to record the daily output from each side. They will be removed in office in the next couple of weeks. Please make sure that the bulbs are charged.  If you experience any issues, please call the office.    Follow-Up: As scheduled.  Things to watch for:  Call the office if you experience fever, chills, intractable vomiting, or significant bleeding.  Mild wound drainage is common after breast reduction surgery and should not be cause for alarm.  You may have Tylenol again after 2:30pm today, if needed.   Post Anesthesia Home Care Instructions  Activity: Get plenty of rest for the remainder of the day. A responsible individual must stay with you for 24 hours following the procedure.  For the next 24 hours, DO NOT: -Drive a car -Paediatric nurse -Drink alcoholic beverages -Take any medication unless instructed by your physician -Make any legal decisions or sign important papers.  Meals: Start with liquid foods such as gelatin or soup. Progress to regular foods as tolerated. Avoid greasy, spicy, heavy foods. If nausea and/or vomiting occur, drink only clear liquids until the nausea and/or vomiting subsides. Call your physician if vomiting continues.  Special Instructions/Symptoms: Your throat may feel dry or sore from the anesthesia or the breathing tube placed in your throat  during surgery. If this causes discomfort, gargle with warm salt water. The discomfort should disappear within 24 hours.  If you had a scopolamine patch placed behind your ear for the management of post- operative nausea and/or vomiting:  1. The medication in the patch is effective for 72 hours, after which it should be removed.  Wrap patch in a tissue and discard in the trash. Wash hands thoroughly with soap and water. 2. You may remove the patch earlier than 72 hours if you experience unpleasant side effects which may include dry mouth, dizziness or visual disturbances. 3. Avoid touching the patch. Wash your hands with soap and water after contact with the patch.       JP Drain [employer] this sheet to all of your post-operative appointments while you have your drains. Please measure your drains by CC's or ML's. Make sure you drain and measure your JP Drains 2 or 3 times per day. At the end of each day, add up totals for the left side and add up totals for the right side.    ( 9 am )     ( 3 pm )        ( 9 pm )                Date L  R  L  R  L  R  Total L/R

## 2023-01-08 NOTE — Op Note (Signed)
DATE OF OPERATION: 01/08/2023  LOCATION: [facility] operating Room  PREOPERATIVE DIAGNOSIS: Breast cancer, status post reconstruction with tissue expanders.  POSTOPERATIVE DIAGNOSIS: Same  PROCEDURE: Removal of bilateral tissue expanders and bilateral subtotal capsulectomies.  SURGEON: Jeanann Lewandowsky, MD  ASSISTANT: Krista Blue  EBL: 100 cc  CONDITION: Stable  COMPLICATIONS: None  INDICATION: The patient, Elizabeth Benton, is a 77 y.o. female born on 01-04-1946, is here for treatment of persistent pain after placement of bilateral tissue expanders.   PROCEDURE DETAILS:  The patient was seen prior to surgery and marked.   IV antibiotics were given. The patient was taken to the operating room and given a general anesthetic. A standard time out was performed and all information was confirmed by those in the room. SCDs were placed.   The chest and bilateral breasts were prepped and draped in the usual sterile manner.  The right breast was addressed first an elliptical incision was made around the previous scar and dissection carried out down to the level of the acellular dermal matrix.  The acellular dermal matrix was incised and the tissue expander was removed.  The upper skin flap was then retracted with skin hooks and the electrocautery used to dissected the capsule and acellular dermal matrix away from the upper skin flap around to the point where it was attached to the chest wall.  The acellular dermal matrix was excised and removed attention was then turned to the lower skin flap where a similar procedure was performed and the dermal matrix and capsule were excised.  Excess skin was then excised leaving enough for a tension-free closure.  The wound was irrigated with normal saline and meticulous hemostasis was achieved with the electrocautery.  There was 1 small vessel which appeared to be an intercostal vessel which continued to bleed this was suture-ligated with a 4-0 Monocryl suture.   A 19 French Blake drain was placed in the surgical bed and the entire surgical cavity was coated with Arista.  The skin edges were then approximated with interrupted 3-0 Monocryl sutures in the dermis and a running 4-0 Monocryl stitch in the skin.  Attention was turned to the left breast where again the scar was excised and the tissue expander removed.  The dermal matrix and capsule were again removed down to the chest wall.  A portion of the capsule on the chest wall was allowed to remain in place.  Wound was irrigated and meticulous hemostasis was achieved with the electrocautery.  A 19 French Blake drain was placed and Arista was used to coat the soft tissues within the surgical wound.  The dermal edges were approximated with 3-0 Monocryl sutures and the skin was closed with a running 4-0 Monocryl subcuticular stitch.  The incisions were sealed with Dermabond and the patient was placed in a compressive bra  The drains were placed to bulb suction.  All instrument needle and sponge counts were reported as correct The patient was allowed to wake up and taken to recovery room in stable condition at the end of the case. The family was notified at the end of the case.   The advanced practice practitioner (APP), Jillyn Ledger, assisted throughout the case.  The APP was essential in retraction and counter traction when needed to make the case progress smoothly.  This retraction and assistance made it possible to see the tissue plans for the procedure.  The assistance was needed for blood control, tissue re-approximation and assisted with closure of the incision site.

## 2023-01-11 ENCOUNTER — Encounter (HOSPITAL_BASED_OUTPATIENT_CLINIC_OR_DEPARTMENT_OTHER): Payer: Self-pay | Admitting: Plastic Surgery

## 2023-01-11 NOTE — Addendum Note (Signed)
Addendum  created 01/11/23 0934 by Duane Boston, MD   Clinical Note Signed, SmartForm saved

## 2023-01-18 ENCOUNTER — Ambulatory Visit: Payer: Medicare Other

## 2023-01-18 ENCOUNTER — Ambulatory Visit (INDEPENDENT_AMBULATORY_CARE_PROVIDER_SITE_OTHER): Payer: 59 | Admitting: Plastic Surgery

## 2023-01-18 DIAGNOSIS — C50911 Malignant neoplasm of unspecified site of right female breast: Secondary | ICD-10-CM

## 2023-01-21 ENCOUNTER — Encounter: Payer: Self-pay | Admitting: *Deleted

## 2023-01-28 ENCOUNTER — Ambulatory Visit (INDEPENDENT_AMBULATORY_CARE_PROVIDER_SITE_OTHER): Payer: Medicare Other | Admitting: Student

## 2023-01-28 DIAGNOSIS — C50911 Malignant neoplasm of unspecified site of right female breast: Secondary | ICD-10-CM

## 2023-01-28 NOTE — Progress Notes (Signed)
Patient is a 77 year old female with history of breast cancer status post breast reconstruction.  Patient most recently underwent removal of bilateral tissue expanders and bilateral subtotal capsulotomies with Dr. Lovena Le on 01/08/2023.  Patient presents to the clinic today for postoperative follow-up.  Today, patient reports she is doing well.  She states that she is sometimes a little sore, but she has no other complaints or concerns.  She denies any fevers or chills.  She denies any drainage from her surgical sites.  Patient reports she is going to be leaving for [facility] tomorrow and she will be gone for a week.  Chaperone present on exam.  On exam, patient is sitting upright in no acute distress.  Incisions are intact and healing well.  There is some residual Dermabond noted over the incisions.  To the right breast, there appears to be some scarring noted underneath the incision.  There are no signs of infection on exam.  To the left breast, there appears to be a palpable fluid collection.  There is no overlying erythema or ecchymosis.  Fluid collection to the left breast was aspirated using a sterile technique.  Approximately 90 cc of clear serosanguineous drainage was aspirated.  Patient tolerated well.  I discussed with the patient that she should continue to wear compression at all times.  I discussed with the patient she should avoid strenuous activities.  I discussed with the patient that there is a chance that the fluid could return and for the patient to continue to monitor the area.  Patient expressed understanding.  I instructed the patient to continue to monitor her left breast.  I discussed with her that if she has any concerns that she should give [place] a call.  Patient expressed understanding.  I discussed with the patient that she should apply Vaseline daily to her scars.  I discussed with her that she should gently massage the scarring to the right side.  I also discussed with her that she  should continue to do light range of motion with the right upper extremity.  Patient expressed understanding.  She states that she has a physical therapy appointment on the 20th.  Patient to follow-up when she returns from her trip.  Plan to see patient back on Monday 02/08/23.   I instructed the patient in the meantime to call if she has any questions or concerns.

## 2023-01-29 NOTE — Progress Notes (Signed)
Elizabeth Benton returns today after bilateral tissue expander removal and capsulectomy.  She states she is doing well with no problems her drain output is consistently below 30 mL per drain.  The incisions are intact without evidence of infection.  There is no erythema.  Drains were removed without difficulty dressings were applied.  Patient was encouraged to slowly increase activity and will return for visit in 2 weeks.

## 2023-02-08 ENCOUNTER — Ambulatory Visit (INDEPENDENT_AMBULATORY_CARE_PROVIDER_SITE_OTHER): Payer: 59 | Admitting: Student

## 2023-02-08 DIAGNOSIS — Z9889 Other specified postprocedural states: Secondary | ICD-10-CM

## 2023-02-08 DIAGNOSIS — C50911 Malignant neoplasm of unspecified site of right female breast: Secondary | ICD-10-CM

## 2023-02-08 NOTE — Progress Notes (Signed)
Patient is a 77 year old female with history of breast cancer status post breast reconstruction.  Patient most recently underwent removal of bilateral tissue expanders and bilateral subtotal capsulotomies with Dr. Lovena Le on 01/08/2023.  Patient presents to the clinic for postoperative follow-up.  Patient was last seen in the clinic on 01/28/2023.  At this visit, she was doing well.  On exam, incisions were intact and healing well.  There was some scarring noted to the right breast.  There is a palpable fluid collection noted to the left breast.  Fluid collection was aspirated and approximately 90 cc of clear serosanguineous drainage was aspirated.  Patient was instructed to wear compression at all times and avoid strenuous activities.  Plan is for patient to follow-up in our clinic when she returns from her trip.  Today, patient reports she is doing well.  She reports that she has been a little sore in the evenings.  She denies any fevers or chills.  She denies any other issues or concerns.  She states that she is starting physical therapy next week.  Chaperone present on exam.  On exam, patient is sitting upright in no acute distress.  Bilateral breast incisions are intact and healing well.  There is no erythema over either breast.  There is some scarring noted to the right breast underneath the incision.  To the left breast, there is a palpable fluid collection.  There is no overlying erythema or skin changes.  There are no signs of infection on exam.  Left breast fluid collection was aspirated.  Left breast was cleaned with alcohol.  Approximately 50 cc of serous drainage was aspirated.  Patient tolerated well.  I discussed with the patient that I would like her to continue to wear compression at all times and avoid strenuous activities.  I discussed with the patient that for the soreness, she should take Tylenol and ibuprofen.  Patient expressed understanding.  I discussed with her that she may use  something like Voltaren cream if it is a small focal area.  Patient was not interested in trying that.  I discussed with the patient to continue to gently massage her right breast and to make sure that she is doing some light range of motion with her right upper extremity.  Patient expressed understanding.  Patient to follow-up in 1 week.  Thank to the patient to call in the meantime if she has any questions or concerns.

## 2023-02-10 ENCOUNTER — Encounter: Payer: Medicare Other | Admitting: Student

## 2023-02-15 ENCOUNTER — Ambulatory Visit (INDEPENDENT_AMBULATORY_CARE_PROVIDER_SITE_OTHER): Payer: 59 | Admitting: Student

## 2023-02-15 ENCOUNTER — Encounter: Payer: Self-pay | Admitting: Student

## 2023-02-15 VITALS — BP 135/78 | HR 89

## 2023-02-15 DIAGNOSIS — C50911 Malignant neoplasm of unspecified site of right female breast: Secondary | ICD-10-CM

## 2023-02-15 NOTE — Progress Notes (Signed)
Patient is a 77 year old female with history of breast cancer status post breast reconstruction.  Patient most recently underwent removal of bilateral tissue expanders and bilateral subtotal capsulotomies with Dr. Lovena Le on 01/08/2023.  Patient presents to clinic for postoperative follow-up.  Patient was last seen in the clinic on 02/08/2023.  At this visit, she reported she was doing well.  On exam, bilateral breast incisions were intact and healing well.  There is no overlying erythema over either breast.  There is scarring noted to the right breast.  To the left breast, there is a palpable fluid collections.  There are no signs of infection on exam.  Approximately 50 cc of serous drainage was aspirated from the left breast.  Plan is for patient to continue to wear compression at all times and avoid strenuous activities.  Today, patient reports she is doing well.  She states that she sometimes has a little soreness, but has been taking supplements that she feels have been helping with the soreness.  Patient reports that she is starting physical therapy again tomorrow.  She denies any fevers or chills.  She has no other concerns or complaints at this time.  Chaperone present on exam.  On exam, patient is sitting upright in no acute distress.  Breast incisions are intact and healing well.  Right breast is noted to have some firmness consistent with scarring.  There is no overlying erythema.  To the left breast, there is a very small amount of fluid palpated on exam.  It appears to be less then her previous exam.  There is no overlying erythema to the skin.  There are no signs of infection on exam.  Skin to the left breast was cleaned with alcohol.  Aspiration was attempted, but no fluid was aspirated.  Patient tolerated well.  I discussed with the patient that I would like her to wear compression at all times.  I discussed with her that she should closely monitor the area.  I discussed with her that if she  develops any redness, increased pain, swelling, fevers or chills, she should let us know.  Patient expressed understanding.  I recommended to the patient that she gently massage the scarring noted to her right breast.  I discussed that she may use this with moisturizer if she would like.  Patient expressed understanding.  Patient to follow-up on Monday with Dr. Lovena Le.  I instructed the patient to call if she has any questions or concerns in the meantime.

## 2023-02-16 NOTE — Therapy (Signed)
OUTPATIENT PHYSICAL THERAPY BREAST CANCER TREATMENT   Patient Name: Elizabeth Benton MRN: [id number] DOB:10-21-1946, 77 y.o., female Today's Date: 02/17/2023  END OF SESSION:  PT End of Session - 02/17/23 1000     Visit Number 9    Number of Visits 21    Date for PT Re-Evaluation 03/31/23    PT Start Time 1000    PT Stop Time 1050    PT Time Calculation (min) 50 min    Activity Tolerance Patient tolerated treatment well    Behavior During Therapy [facility] for tasks assessed/performed              Past Medical History:  Diagnosis Date   Arthritis    Breast cancer ([place])    Depression    H/O migraine    HSV-2 (herpes simplex virus 2) infection    Hyperlipidemia    Hypertension    Lichen sclerosus    Migraine    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   Vaginal atrophy 12/29/2007   Vaginitis and vulvovaginitis 12/28/2004   Past Surgical History:  Procedure Laterality Date   ABDOMINAL HYSTERECTOMY  1987   BREAST RECONSTRUCTION WITH PLACEMENT OF TISSUE EXPANDER AND FLEX HD (ACELLULAR HYDRATED DERMIS) Bilateral 10/20/2022   Procedure: BREAST RECONSTRUCTION WITH PLACEMENT OF TISSUE EXPANDER AND FLEX HD (ACELLULAR HYDRATED DERMIS);  Surgeon: Lennice Sites, MD;  Location: [place];  Service: Plastics;  Laterality: Bilateral;   DILATION AND CURETTAGE OF UTERUS     MASTECTOMY W/ SENTINEL NODE BIOPSY Right 10/20/2022   Procedure: RIGHT MASTECTOMY WITH RIGHT SENTINEL LYMPH NODE BIOPSY;  Surgeon: Stark Klein, MD;  Location: [place];  Service: General;  Laterality: Right;   REMOVAL OF BILATERAL TISSUE EXPANDERS WITH PLACEMENT OF BILATERAL BREAST IMPLANTS Bilateral 01/08/2023   Procedure: Removal of bilateral tissue expanders;  Surgeon: [name], MD;  Location: [place];  Service: Plastics;  Laterality: Bilateral;   TONSILLECTOMY     TOTAL MASTECTOMY Left 10/20/2022   Procedure: LEFT TOTAL MASTECTOMY;  Surgeon: Stark Klein, MD;  Location: [place];  Service: General;   Laterality: Left;   TUBAL LIGATION     WISDOM TOOTH EXTRACTION     Patient Active Problem List   Diagnosis Date Noted   Breast cancer ([place]) 10/20/2022   S/P breast reconstruction 10/20/2022   Breast cancer of upper-outer quadrant of right female breast ([place]) 10/20/2022   Genetic testing 08/24/2022   Family history of breast cancer 08/06/2022   Family history of prostate cancer 08/06/2022   Malignant neoplasm of upper-outer quadrant of right breast in female, estrogen receptor positive ([place]) 07/31/2022   Abnormal glucose level 12/05/2020   Constipation 12/05/2020   Lumbago with sciatica 12/05/2020   Osteopenia 12/05/2020   Overweight 12/05/2020   Screening for malignant neoplasm of colon 12/05/2020   Vitamin D deficiency 12/05/2020   Essential hypertension 11/30/2019   LBBB (left bundle branch block) 11/30/2019   Exertional dyspnea 99991111   Lichen sclerosus 99991111   Mixed hyperlipidemia    HSV-2 (herpes simplex virus 2) infection    Depression    H/O migraine    Monilia infection    Vaginal atrophy    Wears glasses 06/23/2011   Sinus problem/runny nose 06/23/2011   Mass on back 06/23/2011    PCP: Willey Blade, MD  REFERRING PROVIDER: Nicholas Lose, MD  REFERRING DIAG: Right Breast Cancer  THERAPY DIAG:  Malignant neoplasm of upper-outer quadrant of right breast in female, estrogen receptor positive ([place])  Abnormal posture  Stiffness  of left shoulder, not elsewhere classified  Stiffness of right shoulder, not elsewhere classified  Rationale for Evaluation and Treatment: Rehabilitation  ONSET DATE: 07/24/2022  SUBJECTIVE:                                                                                                                                                                                           SUBJECTIVE STATEMENT There is still some fluid in the left chest so we will wait until Monday to see if if it needs to be aspirated. Doing OK, but I  went to turn and felt a big pull in my chest that I wasn't expecting. I still have trouble opening cans or jars. Reaching is still a problem because it feels tight on both sides of the chest. I don't have the heaviness like I had, but I am really sore at the end of the day. I have been wearing the compression bra but at the end of the day I am really sore. I wear a compression wrap to bed and that is more comfortable   PERTINENT HISTORY:  Patient was diagnosed on 07/24/2022 with right grade 2 Invasive Lobular Carcinoma. It measures 1.2 cm and is located in the upper-outer quadrant. It is ER 95%, PR 80% and HER 2 negative with a Ki67 of 40%. She is s/p a Bilateral Mastectomy on 10/20/2022  for Right Breast Cancer and with left prophylactic and bilateral tissue expanders .  She has since had the expanders removed  and bilateral subtotal capsulotomies on Jan. 12,2024. She had 90 cc  of fluid aspirated on 01/28/2023,and 50 cc of  fluid aspirated 02/08/2023 from the left breast area  PATIENT GOALS:  Reassess how my recovery is going related to arm function, pain, and swelling since expander removal and capsulectomies  PAIN:  Are you having pain?  PAIN:  Are you having pain? Yes mostly soreness NPRS scale: 1-2/10 discomfort. Pain location: both lateral trunk, Pain orientation: Right and left PAIN TYPE: tight and bruising sort of, heavy at end of day Pain description: constant  Aggravating factors: Relieving factors: compression, MLD, tumeric  PRECAUTIONS: Recent Surgery, right UE Lymphedema risk, DDD, LBP  ACTIVITY LEVEL / LEISURE: ,  not returned to work, goes back March 4   OBJECTIVE:   PATIENT SURVEYS:  QUICK DASH: Initial eval 61.36,  Re-eval  39%  OBSERVATIONS: Pt with larger right breast  than secondary to expander filled more. Mastectomy incisions are healed with glue still present. Left axillary region with area of swelling/tissue greater than right. Pt will be having an [place] today on the  left. Having expanders removed in early  January.  02/17/2023 Ballotable fluid noted left chest region, Right incision area irregular and folding inward. Left incision healed but also irregular without folding noted  POSTURE:  Forward head, rounded shoulders  LYMPHEDEMA ASSESSMENT:     UPPER EXTREMITY AROM/PROM:   A/PROM RIGHT   eval   Right 12/02/2022 RIGHT 12/29/2022 RIGHT 01/05/2023 Right Re-eval  Shoulder extension 60 49 63   57 tight axilla  Flexion      128 axilla  Shoulder abduction 168 68 145 149 tight axilla 130  Shoulder internal rotation 55    60  Shoulder external rotation 112    98                          (Blank rows = not tested)   A/PROM LEFT   eval LEFT 2023 LEFT 12/29/2022 LEFT Re-eval  Shoulder extension 58 46 65 52 tight  Shoulder flexion 159 120 160 155 tight chest  Shoulder abduction 165 90 156 123 tight axilla  Shoulder internal rotation 65   55  Shoulder external rotation 112   88                          (Blank rows = not tested)     CERVICAL AROM: All within functional  limits: Decreased 20 % Rotation and 50% SB         UPPER EXTREMITY STRENGTH: WNL     LYMPHEDEMA ASSESSMENTS:    LANDMARK RIGHT   eval Right 12/02/2022 RIGHT 02/17/2023  10 cm proximal to olecranon process 28.5 28.7 28.6  Olecranon process 24.6 24.6 24.6  10 cm proximal to ulnar styloid process 21.8 21.7 21.4  Just proximal to ulnar styloid process 16.4 16.3 16.4  Across hand at thumb web space 18.3 18.2 18.4  At base of 2nd digit 6.5 6.6 6.4  (Blank rows = not tested)   LANDMARK LEFT   eval LEFT 12/02/2022  10 cm proximal  29.3 29.7  Olecranon process  24.9 24.7  10 cm proximal to ulnar styloid process 21.0 21.4  Just proximal to ulnar styloid process 16.3 16.3  Across hand at thumb web space 18.0 17.9  At base of 2nd digit 6.45 6.4  (Blank rows = not tested)      Surgery type/Date: 10/20/2022 bilateral Mastectomy with right SLNB, 01/08/2023 Removal of bilateral  tissue expanders and bilateral capsulectomies Number of lymph nodes removed: 0/2 right Current/past treatment (chemo, radiation, hormone therapy): No chemo required, will start anti-estrogen therapy Other symptoms:  Heaviness/tightness Yes Pain Yes Pitting edema No Infections No Decreased scar mobility Yes Stemmer sign No  TREATMENT TODAY  02/17/2023 Large piece of foam cut to cover the full width of her chest over incisions to help flatten incisions and decrease swelling and placed in large TG soft. Assisted pt with placement and fastening bra and patient reported that it felt much better with the foam. Advised to remove if she experiences any discomfort. Discussed resuming 4 post op exercises for ROM   01/05/2022 Pulleys x 2 min flexion and abduction, 1 min scaption Soft Tissue mobilization to bilateralUT, pectorals and Lats with cocoa butter in supine PROM bilateral shoulder flexion, scaption, abd, IR and ER with only occasional VC's to relax AROM bilateral flexion, scaption, horizontal abduction x 5 4 D ball rolls bilaterally x 10 ea 12/29/2022 Pulleys x 2 min ea for shoulder Flexion and abduction Soft Tissue mobilization to right UT, pectorals  and Lats with cocoa butter in supine PROM bilateral shoulder flexion, scaption, abd, IR and ER with only occasional VC's to relax In supine: Short neck, 5 diaphragmatic breaths, L axillary nodes and establishment of interaxillary pathway, R inguinal nodes and establishment of axilloinguinal pathway, then R breast moving fluid towards pathways  then retracing all steps and ending with LN's Measured ROM  12/17/2022 Soft Tissue mobilization to bilateral UT, pectorals and Lats with cocoa butter in supine PROM bilateral shoulder flexion, scaption, abd, IR and ER with only occasional VC's to relax Supine AROM bilateral shoulder flexion,scaption, horizontal abd in comfortable ROM x 5 ea Gentle MFR right lower ribs during AROM for scation to ease  discomfort Supine wand flexion and scaption x4 ea In supine: Short neck, 5 diaphragmatic breaths, L axillary nodes and establishment of interaxillary pathway, R inguinal nodes and establishment of axilloinguinal pathway, then R breast moving fluid towards pathways  then retracing all steps and ending with LN's Assisted pt up from table. Updated HEP with wand exercises   12/15/2022  Soft Tissue mobilization to bilateral UT, pectorals and Lats with cocoa butter in supine PROM bilateral shoulder flexion, scaption, abd, IR and ER with fewer VC's to relax Supine AROM bilateral shoulder flexion,scaption, horizontal abd in comfortable ROM x 5 ea In supine: Short neck, 5 diaphragmatic breaths, L axillary nodes and establishment of interaxillary pathway, R inguinal nodes and establishment of axilloinguinal pathway, then R breast moving fluid towards pathways  then retracing all steps and ending with LN's Assisted pt up from table.    12/10/2022 Soft Tissue mobilization to bilateral UT, pectorals and Lats with cocoa butter in supine PROM bilateral shoulder flexion, scaption, abd, IR and ER with multiple VC's to relax Supine AROM bilateral shoulder flexion,scaption, horizontal abd in comfortable ROM x 5 ea  12/08/2022 Soft Tissue mobilization to bilateral UT, pectorals and Lats with cocoa butter in supine AAROM clasped hands flexion and stargazer x 5, wall slides x 5 PROM bilateral shoulder flexion, scaption, abd, IR and ER with multiple VC's to relax Tried supine wand x 2 but pt had trouble relaxing. Lower trunk rotation to the left x 4 to stretch lats/pecs gently   PATIENT EDUCATION:   Education details: Reviewed 4 post op exercises. Did better with abduction sliding hand on wall Person educated: Patient Education method: Explanation and Demonstration Education comprehension: returned demonstration    HOME EXERCISE PROGRAM: Reviewed previously given post op HEP. Changed wall walk to  wall slide and pt demonstrated good improvement  ASSESSMENT:  CLINICAL IMPRESSION:  Pt returns after having her tissue expamders removed on 01/08/2023. She has had problems with fluid collection and has had the left side drained 2x's already. There is ballotable fluid noted in left chest area today. Incisions are healed but tissue is irregular with some areas that appear to have swelling. Large foam pad was made and placed inside TG soft to place over incision area inside her compression bra to try and decrease swelling and improve incision areas. This was very comfortable for pt.  Pt does have some limitations in bilateral shoulder ROM and tightness throughout chest and anterior rib cage . There are no concerns for UE lymphedema today. She will benefit from skilled PT to address deficits and return to PLOF  Pt will benefit from skilled therapeutic intervention to improve on the following deficits: Decreased knowledge of precautions, impaired UE functional use, pain, decreased ROM, postural dysfunction., edema   PT treatment/interventions: ADL/Self care home management, Therapeutic exercises,  Therapeutic activity, Neuromuscular re-education, Patient/Family education, Self Care, Joint mobilization, Orthotic/Fit training, Dry Needling, Manual lymph drainage, scar mobilization, Manual therapy, and Re-evaluation   GOALS: Goals reviewed with patient? Yes  LONG TERM GOALS:  (STG=LTG)  GOALS Name Target Date  Goal status  1 Pt will demonstrate she has regained full shoulder ROM and function post operatively compared to baselines.  Baseline: 01/13/2023 03/31/2023 In progress  2 Pts quick dash will be improved to pre-surgery level of 36% 03/31/2023 In progress  3 Pt will be able to dress and bathe and perform light household chores independently 01/13/2023 MET 12/29/2022  4 Pt will have decreased pain by 50% overall 01/13/2023 03/31/2023 MET 12/29/2022 Not met since new surgery IN PROGRESS  5. Pt will resume  usual home and work activities with minimal complaint 03/31/2023      PLAN:  PT FREQUENCY/DURATION: 2x/week x 6 weeks  PLAN FOR NEXT SESSION: How was foam in bra, STM bilateral UQ, AAROM, PROM bilateral shoulders, MLD prn especially to left  chest axillary region   [facility]  [address], [address] [place] [address]  [phone]  After Breast Cancer Class It is recommended you attend the ABC class to be educated on lymphedema risk reduction. This class is free of charge and lasts for 1 hour. It is a 1-time class. You will need to download the Webex app either on your phone or computer. We will send you a link the night before or the morning of the class. You should be able to click on that link to join the class. This is not a confidential class. You don't have to turn your camera on, but other participants may be able to see your email address.  Scar massage You can begin gentle scar massage to you incision sites. Gently place one hand on the incision and move the skin (without sliding on the skin) in various directions. Do this for a few minutes and then you can gently massage either coconut oil or vitamin E cream into the scars.  Compression garment You should continue wearing your compression bra until you feel like you no longer have swelling.  Home exercise Program Continue doing the exercises you were given until you feel like you can do them without feeling any tightness at the end.   Walking Program Studies show that 30 minutes of walking per day (fast enough to elevate your heart rate) can significantly reduce the risk of a cancer recurrence. If you can't walk due to other medical reasons, we encourage you to find another activity you could do (like a stationary bike or water exercise).  Posture After breast cancer surgery, people frequently sit with rounded shoulders posture because it puts their incisions on slack and feels better. If you sit  like this and scar tissue forms in that position, you can become very tight and have pain sitting or standing with good posture. Try to be aware of your posture and sit and stand up tall to heal properly.  Follow up PT: It is recommended you return every 3 months for the first 3 years following surgery to be assessed on the SOZO machine for an L-Dex score. This helps prevent clinically significant lymphedema in 95% of patients. These follow up screens are 10 minute appointments that you are not billed for.  Claris Pong, PT 02/17/2023, 11:00 AM

## 2023-02-17 ENCOUNTER — Ambulatory Visit: Payer: 59 | Attending: Hematology and Oncology

## 2023-02-17 DIAGNOSIS — Z170 Estrogen receptor positive status [ER+]: Secondary | ICD-10-CM | POA: Insufficient documentation

## 2023-02-17 DIAGNOSIS — R293 Abnormal posture: Secondary | ICD-10-CM | POA: Diagnosis present

## 2023-02-17 DIAGNOSIS — R222 Localized swelling, mass and lump, trunk: Secondary | ICD-10-CM | POA: Diagnosis present

## 2023-02-17 DIAGNOSIS — C50411 Malignant neoplasm of upper-outer quadrant of right female breast: Secondary | ICD-10-CM | POA: Insufficient documentation

## 2023-02-17 DIAGNOSIS — M25612 Stiffness of left shoulder, not elsewhere classified: Secondary | ICD-10-CM | POA: Insufficient documentation

## 2023-02-17 DIAGNOSIS — M25611 Stiffness of right shoulder, not elsewhere classified: Secondary | ICD-10-CM | POA: Insufficient documentation

## 2023-02-19 ENCOUNTER — Ambulatory Visit: Payer: 59

## 2023-02-19 DIAGNOSIS — R222 Localized swelling, mass and lump, trunk: Secondary | ICD-10-CM

## 2023-02-19 DIAGNOSIS — M25612 Stiffness of left shoulder, not elsewhere classified: Secondary | ICD-10-CM

## 2023-02-19 DIAGNOSIS — C50411 Malignant neoplasm of upper-outer quadrant of right female breast: Secondary | ICD-10-CM

## 2023-02-19 DIAGNOSIS — R293 Abnormal posture: Secondary | ICD-10-CM

## 2023-02-19 DIAGNOSIS — M25611 Stiffness of right shoulder, not elsewhere classified: Secondary | ICD-10-CM

## 2023-02-19 NOTE — Therapy (Signed)
OUTPATIENT PHYSICAL THERAPY BREAST CANCER TREATMENT   Patient Name: Elizabeth Benton MRN: [id number] DOB:1946/07/25, 77 y.o., female Today's Date: 02/19/2023  END OF SESSION:  PT End of Session - 02/19/23 0951     Visit Number 10    Number of Visits 21    Date for PT Re-Evaluation 03/31/23    PT Start Time 0955    PT Stop Time 61    PT Time Calculation (min) 57 min    Activity Tolerance Patient tolerated treatment well    Behavior During Therapy [facility] for tasks assessed/performed              Past Medical History:  Diagnosis Date   Arthritis    Breast cancer ([place])    Depression    H/O migraine    HSV-2 (herpes simplex virus 2) infection    Hyperlipidemia    Hypertension    Lichen sclerosus    Migraine    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   Vaginal atrophy 12/29/2007   Vaginitis and vulvovaginitis 12/28/2004   Past Surgical History:  Procedure Laterality Date   ABDOMINAL HYSTERECTOMY  1987   BREAST RECONSTRUCTION WITH PLACEMENT OF TISSUE EXPANDER AND FLEX HD (ACELLULAR HYDRATED DERMIS) Bilateral 10/20/2022   Procedure: BREAST RECONSTRUCTION WITH PLACEMENT OF TISSUE EXPANDER AND FLEX HD (ACELLULAR HYDRATED DERMIS);  Surgeon: Lennice Sites, MD;  Location: [place];  Service: Plastics;  Laterality: Bilateral;   DILATION AND CURETTAGE OF UTERUS     MASTECTOMY W/ SENTINEL NODE BIOPSY Right 10/20/2022   Procedure: RIGHT MASTECTOMY WITH RIGHT SENTINEL LYMPH NODE BIOPSY;  Surgeon: Stark Klein, MD;  Location: [place];  Service: General;  Laterality: Right;   REMOVAL OF BILATERAL TISSUE EXPANDERS WITH PLACEMENT OF BILATERAL BREAST IMPLANTS Bilateral 01/08/2023   Procedure: Removal of bilateral tissue expanders;  Surgeon: [name], MD;  Location: [place];  Service: Plastics;  Laterality: Bilateral;   TONSILLECTOMY     TOTAL MASTECTOMY Left 10/20/2022   Procedure: LEFT TOTAL MASTECTOMY;  Surgeon: Stark Klein, MD;  Location: [place];  Service: General;   Laterality: Left;   TUBAL LIGATION     WISDOM TOOTH EXTRACTION     Patient Active Problem List   Diagnosis Date Noted   Breast cancer ([place]) 10/20/2022   S/P breast reconstruction 10/20/2022   Breast cancer of upper-outer quadrant of right female breast ([place]) 10/20/2022   Genetic testing 08/24/2022   Family history of breast cancer 08/06/2022   Family history of prostate cancer 08/06/2022   Malignant neoplasm of upper-outer quadrant of right breast in female, estrogen receptor positive ([place]) 07/31/2022   Abnormal glucose level 12/05/2020   Constipation 12/05/2020   Lumbago with sciatica 12/05/2020   Osteopenia 12/05/2020   Overweight 12/05/2020   Screening for malignant neoplasm of colon 12/05/2020   Vitamin D deficiency 12/05/2020   Essential hypertension 11/30/2019   LBBB (left bundle branch block) 11/30/2019   Exertional dyspnea 99991111   Lichen sclerosus 99991111   Mixed hyperlipidemia    HSV-2 (herpes simplex virus 2) infection    Depression    H/O migraine    Monilia infection    Vaginal atrophy    Wears glasses 06/23/2011   Sinus problem/runny nose 06/23/2011   Mass on back 06/23/2011    PCP: Willey Blade, MD  REFERRING PROVIDER: Nicholas Lose, MD  REFERRING DIAG: Right Breast Cancer  THERAPY DIAG:  Malignant neoplasm of upper-outer quadrant of right breast in female, estrogen receptor positive ([place])  Abnormal posture  Stiffness  of left shoulder, not elsewhere classified  Stiffness of right shoulder, not elsewhere classified  Localized swelling of chest wall  Rationale for Evaluation and Treatment: Rehabilitation  ONSET DATE: 07/24/2022  SUBJECTIVE:                                                                                                                                                                                           SUBJECTIVE STATEMENT  The foam pad did OK for a while, but after a while it didn't feel good.  I have the smaller  pads in that you first made. I didn't see any difference in the sweling with the pads.  PERTINENT HISTORY:  Patient was diagnosed on 07/24/2022 with right grade 2 Invasive Lobular Carcinoma. It measures 1.2 cm and is located in the upper-outer quadrant. It is ER 95%, PR 80% and HER 2 negative with a Ki67 of 40%. She is s/p a Bilateral Mastectomy on 10/20/2022  for Right Breast Cancer and with left prophylactic and bilateral tissue expanders .  She has since had the expanders removed  and bilateral subtotal capsulotomies on Jan. 12,2024. She had 90 cc  of fluid aspirated on 01/28/2023,and 50 cc of  fluid aspirated 02/08/2023 from the left breast area  PATIENT GOALS:  Reassess how my recovery is going related to arm function, pain, and swelling since expander removal and capsulectomies  PAIN:  Are you having pain?  PAIN:  Are you having pain? Yes mostly soreness NPRS scale: 1-2/10 discomfort. Pain location: both lateral trunk, Pain orientation: Right and left PAIN TYPE: tight and bruising sort of, heavy at end of day Pain description: constant  Aggravating factors: Relieving factors: compression, MLD, tumeric  PRECAUTIONS: Recent Surgery, right UE Lymphedema risk, DDD, LBP  ACTIVITY LEVEL / LEISURE: ,  not returned to work, goes back March 4   OBJECTIVE:   PATIENT SURVEYS:  QUICK DASH: Initial eval 61.36,  Re-eval  39%  OBSERVATIONS: Pt with larger right breast  than secondary to expander filled more. Mastectomy incisions are healed with glue still present. Left axillary region with area of swelling/tissue greater than right. Pt will be having an [place] today on the left. Having expanders removed in early January.  02/17/2023 Ballotable fluid noted left chest region, Right incision area irregular and folding inward. Left incision healed but also irregular without folding noted  POSTURE:  Forward head, rounded shoulders  LYMPHEDEMA ASSESSMENT:     UPPER EXTREMITY AROM/PROM:   A/PROM RIGHT    eval   Right 12/02/2022 RIGHT 12/29/2022 RIGHT 01/05/2023 Right Re-eval  Shoulder extension 60 49 63   57 tight axilla  Flexion      128 axilla  Shoulder abduction 168 68 145 149 tight axilla 130  Shoulder internal rotation 55    60  Shoulder external rotation 112    98                          (Blank rows = not tested)   A/PROM LEFT   eval LEFT 2023 LEFT 12/29/2022 LEFT Re-eval  Shoulder extension 58 46 65 52 tight  Shoulder flexion 159 120 160 155 tight chest  Shoulder abduction 165 90 156 123 tight axilla  Shoulder internal rotation 65   55  Shoulder external rotation 112   88                          (Blank rows = not tested)     CERVICAL AROM: All within functional  limits: Decreased 20 % Rotation and 50% SB         UPPER EXTREMITY STRENGTH: WNL     LYMPHEDEMA ASSESSMENTS:    LANDMARK RIGHT   eval Right 12/02/2022 RIGHT 02/17/2023  10 cm proximal to olecranon process 28.5 28.7 28.6  Olecranon process 24.6 24.6 24.6  10 cm proximal to ulnar styloid process 21.8 21.7 21.4  Just proximal to ulnar styloid process 16.4 16.3 16.4  Across hand at thumb web space 18.3 18.2 18.4  At base of 2nd digit 6.5 6.6 6.4  (Blank rows = not tested)   LANDMARK LEFT   eval LEFT 12/02/2022  10 cm proximal  29.3 29.7  Olecranon process  24.9 24.7  10 cm proximal to ulnar styloid process 21.0 21.4  Just proximal to ulnar styloid process 16.3 16.3  Across hand at thumb web space 18.0 17.9  At base of 2nd digit 6.45 6.4  (Blank rows = not tested)      Surgery type/Date: 10/20/2022 bilateral Mastectomy with right SLNB, 01/08/2023 Removal of bilateral tissue expanders and bilateral capsulectomies Number of lymph nodes removed: 0/2 right Current/past treatment (chemo, radiation, hormone therapy): No chemo required, will start anti-estrogen therapy Other symptoms:  Heaviness/tightness Yes Pain Yes Pitting edema No Infections No Decreased scar mobility Yes Stemmer sign  No  TREATMENT TODAY  02/19/2023 Overhead pulleys x 1:30 flexion, scaption, abduction, ball rolls on wall x 8 flexion x 5 bilateral abduction Supine wand flexion and scaption 5 x 5 sec  PROM bilateral shoulder flexion, scaption, abduction, ER with intermittent MFR at axilla Lower trunk rotation x 5 ea direction with arms outstretched MLD To short neck, left axillary and inguinal LN's and left axillo-inguinal pathway to decrease swelling, Repeated on right side to decrease edema at axillary region/trunk 02/17/2023 Large piece of foam cut to cover the full width of her chest over incisions to help flatten incisions and decrease swelling and placed in large TG soft. Assisted pt with placement and fastening bra and patient reported that it felt much better with the foam. Advised to remove if she experiences any discomfort. Discussed resuming 4 post op exercises for ROM   01/05/2022 Pulleys x 2 min flexion and abduction, 1 min scaption Soft Tissue mobilization to bilateralUT, pectorals and Lats with cocoa butter in supine PROM bilateral shoulder flexion, scaption, abd, IR and ER with only occasional VC's to relax AROM bilateral flexion, scaption, horizontal abduction x 5 4 D ball rolls bilaterally x 10 ea 12/29/2022 Pulleys x 2 min ea for shoulder Flexion and abduction Soft  Tissue mobilization to right UT, pectorals and Lats with cocoa butter in supine PROM bilateral shoulder flexion, scaption, abd, IR and ER with only occasional VC's to relax In supine: Short neck, 5 diaphragmatic breaths, L axillary nodes and establishment of interaxillary pathway, R inguinal nodes and establishment of axilloinguinal pathway, then R breast moving fluid towards pathways  then retracing all steps and ending with LN's Measured ROM  12/17/2022 Soft Tissue mobilization to bilateral UT, pectorals and Lats with cocoa butter in supine PROM bilateral shoulder flexion, scaption, abd, IR and ER with only occasional VC's to  relax Supine AROM bilateral shoulder flexion,scaption, horizontal abd in comfortable ROM x 5 ea Gentle MFR right lower ribs during AROM for scation to ease discomfort Supine wand flexion and scaption x4 ea In supine: Short neck, 5 diaphragmatic breaths, L axillary nodes and establishment of interaxillary pathway, R inguinal nodes and establishment of axilloinguinal pathway, then R breast moving fluid towards pathways  then retracing all steps and ending with LN's Assisted pt up from table. Updated HEP with wand exercises   12/15/2022  Soft Tissue mobilization to bilateral UT, pectorals and Lats with cocoa butter in supine PROM bilateral shoulder flexion, scaption, abd, IR and ER with fewer VC's to relax Supine AROM bilateral shoulder flexion,scaption, horizontal abd in comfortable ROM x 5 ea In supine: Short neck, 5 diaphragmatic breaths, L axillary nodes and establishment of interaxillary pathway, R inguinal nodes and establishment of axilloinguinal pathway, then R breast moving fluid towards pathways  then retracing all steps and ending with LN's Assisted pt up from table.    12/10/2022 Soft Tissue mobilization to bilateral UT, pectorals and Lats with cocoa butter in supine PROM bilateral shoulder flexion, scaption, abd, IR and ER with multiple VC's to relax Supine AROM bilateral shoulder flexion,scaption, horizontal abd in comfortable ROM x 5 ea  12/08/2022 Soft Tissue mobilization to bilateral UT, pectorals and Lats with cocoa butter in supine AAROM clasped hands flexion and stargazer x 5, wall slides x 5 PROM bilateral shoulder flexion, scaption, abd, IR and ER with multiple VC's to relax Tried supine wand x 2 but pt had trouble relaxing. Lower trunk rotation to the left x 4 to stretch lats/pecs gently   PATIENT EDUCATION:   Education details: Reviewed 4 post op exercises. Did better with abduction sliding hand on wall Person educated: Patient Education method: Explanation and  Demonstration Education comprehension: returned demonstration    HOME EXERCISE PROGRAM: Reviewed previously given post op HEP. Changed wall walk to wall slide and pt demonstrated good improvement  ASSESSMENT:  CLINICAL IMPRESSION: Pt did very well with exercises performed in clinic and demonstrated improved shoulder ROM bilaterally. Pt felt good stretch with LTR . She continues with ballotable fluid at left chest region and bilateral axillary swelling.  Pt will benefit from skilled therapeutic intervention to improve on the following deficits: Decreased knowledge of precautions, impaired UE functional use, pain, decreased ROM, postural dysfunction., edema   PT treatment/interventions: ADL/Self care home management, Therapeutic exercises, Therapeutic activity, Neuromuscular re-education, Patient/Family education, Self Care, Joint mobilization, Orthotic/Fit training, Dry Needling, Manual lymph drainage, scar mobilization, Manual therapy, and Re-evaluation   GOALS: Goals reviewed with patient? Yes  LONG TERM GOALS:  (STG=LTG)  GOALS Name Target Date  Goal status  1 Pt will demonstrate she has regained full shoulder ROM and function post operatively compared to baselines.  Baseline: 01/13/2023 03/31/2023 In progress  2 Pts quick dash will be improved to pre-surgery level of 36% 03/31/2023 In progress  3 Pt will be able to dress and bathe and perform light household chores independently 01/13/2023 MET 12/29/2022  4 Pt will have decreased pain by 50% overall 01/13/2023 03/31/2023 MET 12/29/2022 Not met since new surgery IN PROGRESS  5. Pt will resume usual home and work activities with minimal complaint 03/31/2023      PLAN:  PT FREQUENCY/DURATION: 2x/week x 6 weeks  PLAN FOR NEXT SESSION: How was foam in bra, pulleys,STM bilateral UQ, AAROM, PROM bilateral shoulders, MLD prn especially to left  chest axillary region,   [facility]  [address]  [place] [address]  [phone]  After Breast Cancer Class It is recommended you attend the ABC class to be educated on lymphedema risk reduction. This class is free of charge and lasts for 1 hour. It is a 1-time class. You will need to download the Webex app either on your phone or computer. We will send you a link the night before or the morning of the class. You should be able to click on that link to join the class. This is not a confidential class. You don't have to turn your camera on, but other participants may be able to see your email address.  Scar massage You can begin gentle scar massage to you incision sites. Gently place one hand on the incision and move the skin (without sliding on the skin) in various directions. Do this for a few minutes and then you can gently massage either coconut oil or vitamin E cream into the scars.  Compression garment You should continue wearing your compression bra until you feel like you no longer have swelling.  Home exercise Program Continue doing the exercises you were given until you feel like you can do them without feeling any tightness at the end.   Walking Program Studies show that 30 minutes of walking per day (fast enough to elevate your heart rate) can significantly reduce the risk of a cancer recurrence. If you can't walk due to other medical reasons, we encourage you to find another activity you could do (like a stationary bike or water exercise).  Posture After breast cancer surgery, people frequently sit with rounded shoulders posture because it puts their incisions on slack and feels better. If you sit like this and scar tissue forms in that position, you can become very tight and have pain sitting or standing with good posture. Try to be aware of your posture and sit and stand up tall to heal properly.  Follow up PT: It is recommended you return every 3 months for the first 3 years following surgery to be assessed on the SOZO machine for an  L-Dex score. This helps prevent clinically significant lymphedema in 95% of patients. These follow up screens are 10 minute appointments that you are not billed for.  Claris Pong, PT 02/19/2023, 10:55 AM

## 2023-02-23 ENCOUNTER — Ambulatory Visit (INDEPENDENT_AMBULATORY_CARE_PROVIDER_SITE_OTHER): Payer: 59 | Admitting: Plastic Surgery

## 2023-02-23 ENCOUNTER — Other Ambulatory Visit: Payer: Self-pay

## 2023-02-23 ENCOUNTER — Encounter: Payer: Self-pay | Admitting: Plastic Surgery

## 2023-02-23 ENCOUNTER — Inpatient Hospital Stay: Payer: 59 | Attending: Hematology and Oncology | Admitting: Adult Health

## 2023-02-23 ENCOUNTER — Encounter: Payer: Self-pay | Admitting: Adult Health

## 2023-02-23 VITALS — HR 88

## 2023-02-23 VITALS — BP 126/66 | HR 83 | Temp 97.9°F | Resp 18 | Ht 60.0 in | Wt 134.7 lb

## 2023-02-23 DIAGNOSIS — Z9889 Other specified postprocedural states: Secondary | ICD-10-CM

## 2023-02-23 DIAGNOSIS — Z87891 Personal history of nicotine dependence: Secondary | ICD-10-CM | POA: Insufficient documentation

## 2023-02-23 DIAGNOSIS — Z170 Estrogen receptor positive status [ER+]: Secondary | ICD-10-CM | POA: Diagnosis not present

## 2023-02-23 DIAGNOSIS — C50411 Malignant neoplasm of upper-outer quadrant of right female breast: Secondary | ICD-10-CM

## 2023-02-23 DIAGNOSIS — Z9013 Acquired absence of bilateral breasts and nipples: Secondary | ICD-10-CM | POA: Insufficient documentation

## 2023-02-23 DIAGNOSIS — Z9011 Acquired absence of right breast and nipple: Secondary | ICD-10-CM | POA: Insufficient documentation

## 2023-02-23 NOTE — Progress Notes (Signed)
SURVIVORSHIP VISIT:    BRIEF ONCOLOGIC HISTORY:  Oncology History  Malignant neoplasm of upper-outer quadrant of right breast in female, estrogen receptor positive ([place])  07/24/2022 Initial Diagnosis   Screening mammogram detected right breast asymmetry by ultrasound measured 1.2 cm at 7 o'clock position.  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  Left breast biopsy came back benign.   08/05/2022 Cancer Staging   Staging form: Breast, AJCC 8th Edition - Clinical stage from 08/05/2022: Stage IA (cT1c, cN0, cM0, G2, ER+, PR+, HER2-) - Signed by Nicholas Lose, MD on 08/05/2022 Stage prefix: Initial diagnosis Histologic grading system: 3 grade system    Genetic Testing   [facility] CancerNext-Expanded Panel was Negative. Report date is 08/20/2022.  The CancerNext-Expanded gene panel offered by [facility] and includes sequencing, rearrangement, and RNA analysis for the following 77 genes: AIP, ALK, APC, ATM, AXIN2, BAP1, BARD1, BLM, BMPR1A, BRCA1, BRCA2, BRIP1, CDC73, CDH1, CDK4, CDKN1B, CDKN2A, CHEK2, CTNNA1, DICER1, FANCC, FH, FLCN, GALNT12, KIF1B, LZTR1, MAX, MEN1, MET, MLH1, MSH2, MSH3, MSH6, MUTYH, NBN, NF1, NF2, NTHL1, PALB2, PHOX2B, PMS2, POT1, PRKAR1A, PTCH1, PTEN, RAD51C, RAD51D, RB1, RECQL, RET, SDHA, SDHAF2, SDHB, SDHC, SDHD, SMAD4, SMARCA4, SMARCB1, SMARCE1, STK11, SUFU, TMEM127, TP53, TSC1, TSC2, VHL and XRCC2 (sequencing and deletion/duplication); EGFR, EGLN1, HOXB13, KIT, MITF, PDGFRA, POLD1, and POLE (sequencing only); EPCAM and GREM1 (deletion/duplication only).    10/20/2022 Surgery   Left mastectomy: Benign, 2 benign lymph nodes Right mastectomy: Grade 3 ILC 2.1 cm, LCIS, margins negative, 0/4 lymph nodes, ER 95%, PR 80%, HER2 negative 1+, Ki-67 40%   10/2022 -  Anti-estrogen oral therapy   Letrozole x 5-7 years     INTERVAL HISTORY:  Elizabeth Benton to review her survivorship care plan detailing her treatment course for breast  cancer, as well as monitoring long-term side effects of that treatment, education regarding health maintenance, screening, and overall wellness and health promotion.     Overall, Elizabeth Benton reports feeling quite well.  Elizabeth Benton was initially on anastrozole and experienced itching.  She was then changed to letrozole in December 2023 and has been tolerating it moderately well.  She continues on this daily and does endorse manageable hot flashes and occasional hair thinning.  She has also been seeing physical therapy due to some axillary soreness following her bilateral mastectomies but she also follows up with plastic surgery and is healing and recovering well.  She denies any other side effects and her last bone density testing occurred in September 2020.   REVIEW OF SYSTEMS:  Review of Systems  Constitutional:  Negative for appetite change, chills, fatigue, fever and unexpected weight change.  HENT:   Negative for hearing loss, lump/mass, mouth sores, sore throat and trouble swallowing.   Eyes:  Negative for eye problems and icterus.  Respiratory:  Negative for chest tightness, cough and shortness of breath.   Cardiovascular:  Negative for chest pain, leg swelling and palpitations.  Gastrointestinal:  Negative for abdominal distention, abdominal pain, constipation, diarrhea, nausea and vomiting.  Endocrine: Positive for hot flashes.  Genitourinary:  Negative for difficulty urinating.   Musculoskeletal:  Negative for arthralgias.  Skin:  Negative for itching and rash.  Neurological:  Negative for dizziness, extremity weakness, headaches and numbness.  Hematological:  Negative for adenopathy. Does not bruise/bleed easily.  Psychiatric/Behavioral:  Negative for depression. The patient is not nervous/anxious.           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   Vaginal atrophy 12/29/2007   Vaginitis and vulvovaginitis 12/28/2004   Past Surgical History:  Procedure Laterality Date   ABDOMINAL HYSTERECTOMY  1987   BREAST RECONSTRUCTION WITH PLACEMENT OF TISSUE EXPANDER AND FLEX HD (ACELLULAR HYDRATED DERMIS) Bilateral 10/20/2022   Procedure: BREAST RECONSTRUCTION WITH PLACEMENT OF TISSUE EXPANDER AND FLEX HD (ACELLULAR HYDRATED DERMIS);  Surgeon: Lennice Sites, MD;  Location: [facility];  Service: Plastics;  Laterality: Bilateral;   DILATION AND CURETTAGE OF UTERUS     MASTECTOMY W/ SENTINEL NODE BIOPSY Right 10/20/2022   Procedure: RIGHT MASTECTOMY WITH RIGHT SENTINEL LYMPH NODE BIOPSY;  Surgeon: Stark Klein, MD;  Location: [facility];  Service: General;  Laterality: Right;   REMOVAL OF BILATERAL TISSUE EXPANDERS WITH PLACEMENT OF BILATERAL BREAST IMPLANTS Bilateral 01/08/2023   Procedure: Removal of bilateral tissue expanders;  Surgeon: [name], MD;  Location: [facility];  Service: Plastics;  Laterality: Bilateral;   TONSILLECTOMY     TOTAL MASTECTOMY Left 10/20/2022   Procedure: LEFT TOTAL MASTECTOMY;  Surgeon: Stark Klein, MD;  Location: [facility];  Service: General;  Laterality: Left;   TUBAL LIGATION     WISDOM TOOTH EXTRACTION       ALLERGIES:  Allergies  Allergen Reactions   Contrast Media [Iodinated Contrast Media] Anaphylaxis   Demerol Other (See Comments)    Patient stated she will pass out, unaware of surroundings. Last time she received it (for surgery) her stomach had to be pumped.   Iodine Anaphylaxis   Anastrozole Itching     CURRENT MEDICATIONS:  Outpatient Encounter Medications as of 02/23/2023  Medication Sig   acetaminophen (TYLENOL) 650 MG CR tablet Take 650 mg by mouth every 8 (eight) hours as needed for pain.   ALPRAZolam (XANAX) 0.25 MG tablet Take 0.25-0.5 mg by mouth every 8 (eight)  hours as needed for anxiety.   amLODipine (NORVASC) 5 MG tablet Take 1.5 tablets (7.5 mg total) by mouth daily. (Patient taking differently: Take 5 mg by mouth daily.)   betamethasone dipropionate 0.05 % cream Apply 1 Application topically 2 (two) times daily as needed for rash.   Carboxymethylcellul-Glycerin (LUBRICATING EYE DROPS OP) Place 1 drop into both eyes daily as needed (dry eyes).   gabapentin (NEURONTIN) 100 MG capsule Take 1 capsule (100 mg total) by mouth 2 (two) times daily.   letrozole (FEMARA) 2.5 MG tablet TAKE 1 TABLET BY MOUTH EVERY DAY   methocarbamol (ROBAXIN) 500 MG tablet Take 1 tablet (500 mg total) by mouth every 8 (eight) hours as needed for up to 15 doses for muscle spasms.   rosuvastatin (CRESTOR) 20 MG tablet TAKE 1 TABLET BY MOUTH EVERY DAY   senna (SENOKOT) 8.6 MG TABS tablet Take 1 tablet (8.6 mg total) by mouth 2 (two) times daily.   TURMERIC PO Take by mouth.   [DISCONTINUED] HYDROcodone-acetaminophen (NORCO) 5-325 MG tablet Take 1 tablet by mouth every 8 (eight) hours as needed for up to 20 doses for moderate pain.   [DISCONTINUED] ketorolac (TORADOL) 10 MG tablet Take 1 tablet (10 mg total) by mouth every 8 (eight) hours as needed for up to 10 doses.   [DISCONTINUED] ondansetron (ZOFRAN) 4 MG tablet Take  1 tablet (4 mg total) by mouth every 8 (eight) hours as needed for up to 15 doses for nausea or vomiting.   [DISCONTINUED] ondansetron (ZOFRAN) 8 MG tablet Take 1 tablet (8 mg total) by mouth every 8 (eight) hours as needed for up to 40 doses for nausea or vomiting.   No facility-administered encounter medications on file as of 02/23/2023.     ONCOLOGIC FAMILY HISTORY:  Family History  Problem Relation Age of Onset   Stroke Mother    Throat cancer Father 2   Prostate cancer Brother 64   Breast cancer Maternal Aunt 65   Prostate cancer Maternal Uncle    Prostate cancer Maternal Uncle    Prostate cancer Maternal Uncle    Pancreatic cancer Paternal Uncle     Breast cancer Cousin        4 maternal first cousins   Prostate cancer Cousin    Breast cancer Other        maternal first cousin once removed   Other Other        BRCA+     SOCIAL HISTORY:  Social History   Socioeconomic History   Marital status: Married    Spouse name: Kenyarda Cardillo   Number of children: 2   Years of education: Not on file   Highest education level: Not on file  Occupational History   Not on file  Tobacco Use   Smoking status: Former    Packs/day: 0.50    Years: 40.00    Total pack years: 20.00    Types: Cigarettes    Quit date: 01/22/1997    Years since quitting: 26.1   Smokeless tobacco: Never  Vaping Use   Vaping Use: Never used  Substance and Sexual Activity   Alcohol use: Yes    Alcohol/week: 1.0 standard drink of alcohol    Types: 1 Standard drinks or equivalent per week    Comment: occ   Drug use: No   Sexual activity: Not Currently    Birth control/protection: Post-menopausal    Comment: hyst  Other Topics Concern   Not on file  Social History Narrative   Not on file   Social Determinants of Health   Financial Resource Strain: Not on file  Food Insecurity: Not on file  Transportation Needs: Not on file  Physical Activity: Not on file  Stress: Not on file  Social Connections: Not on file  Intimate Partner Violence: Not on file     OBSERVATIONS/OBJECTIVE:  BP 126/66 (BP Location: Left Arm, Patient Position: Sitting)   Pulse 83   Temp 97.9 F (36.6 C) (Temporal)   Resp 18   Ht 5' (1.524 m)   Wt 134 lb 11.2 oz (61.1 kg)   SpO2 98%   BMI 26.31 kg/m  GENERAL: Patient is a well appearing female in no acute distress HEENT:  Sclerae anicteric.  Oropharynx clear and moist. No ulcerations or evidence of oropharyngeal candidiasis. Neck is supple.  NODES:  No cervical, supraclavicular, or axillary lymphadenopathy palpated.  BREAST EXAM:  s/p bilateral mastectomies, was examined by plastic surgery today so breast exam was  deferred. LUNGS:  Clear to auscultation bilaterally.  No wheezes or rhonchi. HEART:  Regular rate and rhythm. No murmur appreciated. ABDOMEN:  Soft, nontender.  Positive, normoactive bowel sounds. No organomegaly palpated. MSK:  No focal spinal tenderness to palpation. Full range of motion bilaterally in the upper extremities. EXTREMITIES:  No peripheral edema.   SKIN:  Clear with no obvious rashes or  skin changes. No nail dyscrasia. NEURO:  Nonfocal. Well oriented.  Appropriate affect.  LABORATORY DATA:  None for this visit.  DIAGNOSTIC IMAGING:  None for this visit.      ASSESSMENT AND PLAN:  Ms.. Benton is a pleasant 77 y.o. 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  She presents to the Survivorship Clinic for our initial meeting and routine follow-up post-completion of treatment for breast cancer.    1. Stage 1A right breast cancer:  Elizabeth Benton is continuing to recover from definitive treatment for breast cancer. She will follow-up with her medical oncologist, Dr. Lindi Adie in 6 months with history and physical exam per surveillance protocol.  She will continue her anti-estrogen therapy with letrozole. Thus far, she is tolerating the letrozole well, with minimal side effects. She was instructed to make Dr. Lindi Adie or myself aware if she begins to experience any worsening side effects of the medication and I could see her back in clinic to help manage those side effects, as needed.  We did supply her with noted knockers from a light today which she very much appreciated.    Today, a comprehensive survivorship care plan and treatment summary was reviewed with the patient today detailing her breast cancer diagnosis, treatment course, potential late/long-term effects of treatment, appropriate follow-up care with recommendations for the  future, and patient education resources.  A copy of this summary, along with a letter will be sent to the patient's primary care provider via mail/fax/In Basket message after today's visit.    2. Bone health:  Given Elizabeth Benton's age/history of breast cancer and her current treatment regimen including anti-estrogen therapy with letrozole, she is at risk for bone demineralization.  Her last DEXA scan was December 05, 2019 demonstrating osteopenia with a T-score of -1.8 in the right femur.  Repeat bone density testing was ordered and I recommended that she undergo bone density testing every 2 years while taking aromatase inhibitor therapy.  She was given education on specific activities to promote bone health.  3. Cancer screening:  Due to Elizabeth Benton's history and her age, she should receive screening for skin cancers, colon cancer.  The information and recommendations are listed on the patient's comprehensive care plan/treatment summary and were reviewed in detail with the patient.    4. Health maintenance and wellness promotion: Elizabeth Benton was encouraged to consume 5-7 servings of fruits and vegetables per day. We reviewed the "Nutrition Rainbow" handout.  She was also encouraged to engage in moderate to vigorous exercise for 30 minutes per day most days of the week.  She was instructed to limit her alcohol consumption and continue to abstain from tobacco use.     5. Support services/counseling: It is not uncommon for this period of the patient's cancer care trajectory to be one of many emotions and stressors. She was given information regarding our available services and encouraged to contact me with any questions or for help enrolling in any of our support group/programs.    Follow up instructions:    -Return to cancer center in 6 months for follow-up with Dr. Lindi Adie -Density testing recommended to occur in the next 6 months -She is welcome to return back to the Survivorship Clinic at any time; no  additional follow-up needed at this time.  -Consider referral back to survivorship as a long-term survivor for continued surveillance  The patient was provided  an opportunity to ask questions and all were answered. The patient agreed with the plan and demonstrated an understanding of the instructions.   Total encounter time:45 minutes*in face-to-face visit time, chart review, lab review, care coordination, order entry, and documentation of the encounter time.    Wilber Bihari, NP 02/23/23 10:57 AM Medical Oncology and Hematology [facility] [place], [place] [address] Tel. [phone]    Fax. [phone]  *Total Encounter Time as defined by the Centers for Medicare and Medicaid Services includes, in addition to the face-to-face time of a patient visit (documented in the note above) non-face-to-face time: obtaining and reviewing outside history, ordering and reviewing medications, tests or procedures, care coordination (communications with other health care professionals or caregivers) and documentation in the medical record.

## 2023-02-23 NOTE — Progress Notes (Signed)
Ms. Penix returns today for postoperative evaluation after removal of bilateral tissue expanders.  Her only complaints at this time are some discomfort in the left axilla.  Otherwise she is doing well.  On physical exam all incisions are clean dry and intact.  I do not appreciate any masses or other etiology for the pain in her left axilla.  Status post removal of bilateral tissue expanders: I have encouraged the patient to decrease the amount of pressure that she has with her compressive garment and to go to something that just gives her gentle support.  She is also able to return to work on March 4.  She has no restrictions on activity.  She will return to see me in 4 to 6 weeks unless she needs to be seen sooner.

## 2023-02-24 ENCOUNTER — Ambulatory Visit: Payer: 59

## 2023-02-24 DIAGNOSIS — M25611 Stiffness of right shoulder, not elsewhere classified: Secondary | ICD-10-CM

## 2023-02-24 DIAGNOSIS — C50411 Malignant neoplasm of upper-outer quadrant of right female breast: Secondary | ICD-10-CM

## 2023-02-24 DIAGNOSIS — M25612 Stiffness of left shoulder, not elsewhere classified: Secondary | ICD-10-CM

## 2023-02-24 DIAGNOSIS — R293 Abnormal posture: Secondary | ICD-10-CM

## 2023-02-24 DIAGNOSIS — R222 Localized swelling, mass and lump, trunk: Secondary | ICD-10-CM

## 2023-02-24 NOTE — Therapy (Signed)
OUTPATIENT PHYSICAL THERAPY BREAST CANCER TREATMENT   Patient Name: Elizabeth Benton MRN: [id number] DOB:[birth year], 77 y.o., female Today's Date: 02/24/2023  END OF SESSION:  PT End of Session - 02/24/23 0956     Visit Number 11    Number of Visits 21    Date for PT Re-Evaluation 03/31/23    PT Start Time 1000    PT Stop Time 1055    PT Time Calculation (min) 55 min    Activity Tolerance Patient tolerated treatment well    Behavior During Therapy [facility] for tasks assessed/performed              Past Medical History:  Diagnosis Date   Arthritis    Breast cancer ([place])    Depression    H/O migraine    HSV-2 (herpes simplex virus 2) infection    Hyperlipidemia    Hypertension    Lichen sclerosus    Migraine    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   Vaginal atrophy 12/29/2007   Vaginitis and vulvovaginitis 12/28/2004   Past Surgical History:  Procedure Laterality Date   ABDOMINAL HYSTERECTOMY  1987   BREAST RECONSTRUCTION WITH PLACEMENT OF TISSUE EXPANDER AND FLEX HD (ACELLULAR HYDRATED DERMIS) Bilateral 10/20/2022   Procedure: BREAST RECONSTRUCTION WITH PLACEMENT OF TISSUE EXPANDER AND FLEX HD (ACELLULAR HYDRATED DERMIS);  Surgeon: Lennice Sites, MD;  Location: [place];  Service: Plastics;  Laterality: Bilateral;   DILATION AND CURETTAGE OF UTERUS     MASTECTOMY W/ SENTINEL NODE BIOPSY Right 10/20/2022   Procedure: RIGHT MASTECTOMY WITH RIGHT SENTINEL LYMPH NODE BIOPSY;  Surgeon: Stark Klein, MD;  Location: [place];  Service: General;  Laterality: Right;   REMOVAL OF BILATERAL TISSUE EXPANDERS WITH PLACEMENT OF BILATERAL BREAST IMPLANTS Bilateral 01/08/2023   Procedure: Removal of bilateral tissue expanders;  Surgeon: [name], MD;  Location: [place];  Service: Plastics;  Laterality: Bilateral;   TONSILLECTOMY     TOTAL MASTECTOMY Left 10/20/2022   Procedure: LEFT TOTAL MASTECTOMY;  Surgeon: Stark Klein, MD;  Location: [place];  Service: General;   Laterality: Left;   TUBAL LIGATION     WISDOM TOOTH EXTRACTION     Patient Active Problem List   Diagnosis Date Noted   S/P breast reconstruction 10/20/2022   Genetic testing 08/24/2022   Family history of breast cancer 08/06/2022   Family history of prostate cancer 08/06/2022   Malignant neoplasm of upper-outer quadrant of right breast in female, estrogen receptor positive ([place]) 07/31/2022   Abnormal glucose level 12/05/2020   Constipation 12/05/2020   Lumbago with sciatica 12/05/2020   Osteopenia 12/05/2020   Overweight 12/05/2020   Screening for malignant neoplasm of colon 12/05/2020   Vitamin D deficiency 12/05/2020   Essential hypertension 11/30/2019   LBBB (left bundle branch block) 11/30/2019   Exertional dyspnea 99991111   Lichen sclerosus 99991111   Mixed hyperlipidemia    HSV-2 (herpes simplex virus 2) infection    Depression    H/O migraine    Monilia infection    Vaginal atrophy    Wears glasses 06/23/2011   Sinus problem/runny nose 06/23/2011   Mass on back 06/23/2011    PCP: Willey Blade, MD  REFERRING PROVIDER: Nicholas Lose, MD  REFERRING DIAG: Right Breast Cancer  THERAPY DIAG:  Malignant neoplasm of upper-outer quadrant of right breast in female, estrogen receptor positive ([place])  Abnormal posture  Stiffness of left shoulder, not elsewhere classified  Stiffness of right shoulder, not elsewhere classified  Localized swelling of chest  wall  Rationale for Evaluation and Treatment: Rehabilitation  ONSET DATE: 07/24/2022  SUBJECTIVE:                                                                                                                                                                                           SUBJECTIVE STATEMENT  I am not as sore.. The Dr. Rockey Situ me not to use compression any more because it was making me really sore.  I was still sore after last visit and felt restricted, but now I feel like I can move a little  more.  PERTINENT HISTORY:  Patient was diagnosed on 07/24/2022 with right grade 2 Invasive Lobular Carcinoma. It measures 1.2 cm and is located in the upper-outer quadrant. It is ER 95%, PR 80% and HER 2 negative with a Ki67 of 40%. She is s/p a Bilateral Mastectomy on 10/20/2022  for Right Breast Cancer and with left prophylactic and bilateral tissue expanders .  She has since had the expanders removed  and bilateral subtotal capsulotomies on Jan. 12,2024. She had 90 cc  of fluid aspirated on 01/28/2023,and 50 cc of  fluid aspirated 02/08/2023 from the left breast area  PATIENT GOALS:  Reassess how my recovery is going related to arm function, pain, and swelling since expander removal and capsulectomies  PAIN:  Are you having pain?  PAIN:  Are you having pain? Yes mostly soreness NPRS scale: 1-2/10 discomfort. Pain location: both lateral trunk,  under arm pits Pain orientation: Right and left PAIN TYPE: tight and bruising sort of, heavy at end of day Pain description: constant  Aggravating factors: Relieving factors: compression, MLD, tumeric  PRECAUTIONS: Recent Surgery, right UE Lymphedema risk, DDD, LBP  ACTIVITY LEVEL / LEISURE: ,  not returned to work, goes back March 4   OBJECTIVE:   PATIENT SURVEYS:  QUICK DASH: Initial eval 61.36,  Re-eval  39%  OBSERVATIONS: Pt with larger right breast  than secondary to expander filled more. Mastectomy incisions are healed with glue still present. Left axillary region with area of swelling/tissue greater than right. Pt will be having an [place] today on the left. Having expanders removed in early January.  02/17/2023 Ballotable fluid noted left chest region, Right incision area irregular and folding inward. Left incision healed but also irregular without folding noted  POSTURE:  Forward head, rounded shoulders  LYMPHEDEMA ASSESSMENT:     UPPER EXTREMITY AROM/PROM:   A/PROM RIGHT   eval   Right 12/02/2022 RIGHT 12/29/2022 RIGHT 01/05/2023  Right Re-eval  Shoulder extension 60 49 63   57 tight axilla  Flexion      128 axilla  Shoulder abduction 168 68 145 149 tight axilla 130  Shoulder internal rotation 55    60  Shoulder external rotation 112    98                          (Blank rows = not tested)   A/PROM LEFT   eval LEFT 2023 LEFT 12/29/2022 LEFT Re-eval  Shoulder extension 58 46 65 52 tight  Shoulder flexion 159 120 160 155 tight chest  Shoulder abduction 165 90 156 123 tight axilla  Shoulder internal rotation 65   55  Shoulder external rotation 112   88                          (Blank rows = not tested)     CERVICAL AROM: All within functional  limits: Decreased 20 % Rotation and 50% SB         UPPER EXTREMITY STRENGTH: WNL     LYMPHEDEMA ASSESSMENTS:    LANDMARK RIGHT   eval Right 12/02/2022 RIGHT 02/17/2023  10 cm proximal to olecranon process 28.5 28.7 28.6  Olecranon process 24.6 24.6 24.6  10 cm proximal to ulnar styloid process 21.8 21.7 21.4  Just proximal to ulnar styloid process 16.4 16.3 16.4  Across hand at thumb web space 18.3 18.2 18.4  At base of 2nd digit 6.5 6.6 6.4  (Blank rows = not tested)   LANDMARK LEFT   eval LEFT 12/02/2022  10 cm proximal  29.3 29.7  Olecranon process  24.9 24.7  10 cm proximal to ulnar styloid process 21.0 21.4  Just proximal to ulnar styloid process 16.3 16.3  Across hand at thumb web space 18.0 17.9  At base of 2nd digit 6.45 6.4  (Blank rows = not tested)      Surgery type/Date: 10/20/2022 bilateral Mastectomy with right SLNB, 01/08/2023 Removal of bilateral tissue expanders and bilateral capsulectomies Number of lymph nodes removed: 0/2 right Current/past treatment (chemo, radiation, hormone therapy): No chemo required, will start anti-estrogen therapy Other symptoms:  Heaviness/tightness Yes Pain Yes Pitting edema No Infections No Decreased scar mobility Yes Stemmer sign No  TREATMENT TODAY  02/24/2023  Overhead pulleys x 1:30  flexion, scaption, abduction, ball rolls on wall x 5 flexion x 5 bilateral abduction STM bilateral UT, pectorals, lats with cocoa butter Supine wand flexion and scaption 5 x 5 sec  PROM bilateral shoulder flexion, scaption, abduction, ER with intermittent MFR at axilla MFR bilateral chest region Lower trunk rotation x 5 ea direction with arms outstretched   02/19/2023  Overhead pulleys x 1:30 flexion, scaption, abduction, ball rolls on wall x 8 flexion x 5 bilateral abduction Supine wand flexion and scaption 5 x 5 sec  PROM bilateral shoulder flexion, scaption, abduction, ER with intermittent MFR at axilla Lower trunk rotation x 5 ea direction with arms outstretched MLD To short neck, left axillary and inguinal LN's and left axillo-inguinal pathway to decrease swelling, Repeated on right side to decrease edema at axillary region/trunk  02/17/2023 Large piece of foam cut to cover the full width of her chest over incisions to help flatten incisions and decrease swelling and placed in large TG soft. Assisted pt with placement and fastening bra and patient reported that it felt much better with the foam. Advised to remove if she experiences any discomfort. Discussed resuming 4 post op exercises for ROM   01/05/2022 Pulleys x 2 min flexion and abduction,  1 min scaption Soft Tissue mobilization to bilateralUT, pectorals and Lats with cocoa butter in supine PROM bilateral shoulder flexion, scaption, abd, IR and ER with only occasional VC's to relax AROM bilateral flexion, scaption, horizontal abduction x 5 4 D ball rolls bilaterally x 10 ea 12/29/2022 Pulleys x 2 min ea for shoulder Flexion and abduction Soft Tissue mobilization to right UT, pectorals and Lats with cocoa butter in supine PROM bilateral shoulder flexion, scaption, abd, IR and ER with only occasional VC's to relax In supine: Short neck, 5 diaphragmatic breaths, L axillary nodes and establishment of interaxillary pathway, R inguinal  nodes and establishment of axilloinguinal pathway, then R breast moving fluid towards pathways  then retracing all steps and ending with LN's Measured ROM  12/17/2022 Soft Tissue mobilization to bilateral UT, pectorals and Lats with cocoa butter in supine PROM bilateral shoulder flexion, scaption, abd, IR and ER with only occasional VC's to relax Supine AROM bilateral shoulder flexion,scaption, horizontal abd in comfortable ROM x 5 ea Gentle MFR right lower ribs during AROM for scation to ease discomfort Supine wand flexion and scaption x4 ea In supine: Short neck, 5 diaphragmatic breaths, L axillary nodes and establishment of interaxillary pathway, R inguinal nodes and establishment of axilloinguinal pathway, then R breast moving fluid towards pathways  then retracing all steps and ending with LN's Assisted pt up from table. Updated HEP with wand exercises   12/15/2022  Soft Tissue mobilization to bilateral UT, pectorals and Lats with cocoa butter in supine PROM bilateral shoulder flexion, scaption, abd, IR and ER with fewer VC's to relax Supine AROM bilateral shoulder flexion,scaption, horizontal abd in comfortable ROM x 5 ea In supine: Short neck, 5 diaphragmatic breaths, L axillary nodes and establishment of interaxillary pathway, R inguinal nodes and establishment of axilloinguinal pathway, then R breast moving fluid towards pathways  then retracing all steps and ending with LN's Assisted pt up from table.    12/10/2022 Soft Tissue mobilization to bilateral UT, pectorals and Lats with cocoa butter in supine PROM bilateral shoulder flexion, scaption, abd, IR and ER with multiple VC's to relax Supine AROM bilateral shoulder flexion,scaption, horizontal abd in comfortable ROM x 5 ea  12/08/2022 Soft Tissue mobilization to bilateral UT, pectorals and Lats with cocoa butter in supine AAROM clasped hands flexion and stargazer x 5, wall slides x 5 PROM bilateral shoulder flexion,  scaption, abd, IR and ER with multiple VC's to relax Tried supine wand x 2 but pt had trouble relaxing. Lower trunk rotation to the left x 4 to stretch lats/pecs gently   PATIENT EDUCATION:   Education details: Reviewed 4 post op exercises. Did better with abduction sliding hand on wall Person educated: Patient Education method: Explanation and Demonstration Education comprehension: returned demonstration    HOME EXERCISE PROGRAM: Reviewed previously given post op HEP. Changed wall walk to wall slide and pt demonstrated good improvement  ASSESSMENT:  CLINICAL IMPRESSION: Pts pain levels/soreness improved since not wearing compression per MD orders.  Shoulder ROM with good improvement also. Pt still feels tight at right ribcage with shoulder ROM activities  Pt will benefit from skilled therapeutic intervention to improve on the following deficits: Decreased knowledge of precautions, impaired UE functional use, pain, decreased ROM, postural dysfunction., edema   PT treatment/interventions: ADL/Self care home management, Therapeutic exercises, Therapeutic activity, Neuromuscular re-education, Patient/Family education, Self Care, Joint mobilization, Orthotic/Fit training, Dry Needling, Manual lymph drainage, scar mobilization, Manual therapy, and Re-evaluation   GOALS: Goals reviewed with patient? Yes  LONG TERM GOALS:  (STG=LTG)  GOALS Name Target Date  Goal status  1 Pt will demonstrate she has regained full shoulder ROM and function post operatively compared to baselines.  Baseline: 01/13/2023 03/31/2023 In progress  2 Pts quick dash will be improved to pre-surgery level of 36% 03/31/2023 In progress  3 Pt will be able to dress and bathe and perform light household chores independently 01/13/2023 MET 12/29/2022  4 Pt will have decreased pain by 50% overall 01/13/2023 03/31/2023 MET 12/29/2022 Not met since new surgery IN PROGRESS  5. Pt will resume usual home and work activities with  minimal complaint 03/31/2023      PLAN:  PT FREQUENCY/DURATION: 2x/week x 6 weeks  PLAN FOR NEXT SESSION: Check swelling at chest, pulleys,STM bilateral UQ, AAROM, PROM bilateral shoulders,  ABC strength handout,MLD prn especially to left  chest axillary region,   [facility]  [address]  [place] [address]  [phone]  After Breast Cancer Class It is recommended you attend the ABC class to be educated on lymphedema risk reduction. This class is free of charge and lasts for 1 hour. It is a 1-time class. You will need to download the Webex app either on your phone or computer. We will send you a link the night before or the morning of the class. You should be able to click on that link to join the class. This is not a confidential class. You don't have to turn your camera on, but other participants may be able to see your email address.  Scar massage You can begin gentle scar massage to you incision sites. Gently place one hand on the incision and move the skin (without sliding on the skin) in various directions. Do this for a few minutes and then you can gently massage either coconut oil or vitamin E cream into the scars.  Compression garment You should continue wearing your compression bra until you feel like you no longer have swelling.  Home exercise Program Continue doing the exercises you were given until you feel like you can do them without feeling any tightness at the end.   Walking Program Studies show that 30 minutes of walking per day (fast enough to elevate your heart rate) can significantly reduce the risk of a cancer recurrence. If you can't walk due to other medical reasons, we encourage you to find another activity you could do (like a stationary bike or water exercise).  Posture After breast cancer surgery, people frequently sit with rounded shoulders posture because it puts their incisions on slack and feels better. If you sit  like this and scar tissue forms in that position, you can become very tight and have pain sitting or standing with good posture. Try to be aware of your posture and sit and stand up tall to heal properly.  Follow up PT: It is recommended you return every 3 months for the first 3 years following surgery to be assessed on the SOZO machine for an L-Dex score. This helps prevent clinically significant lymphedema in 95% of patients. These follow up screens are 10 minute appointments that you are not billed for.  Claris Pong, PT 02/24/2023, 11:03 AM

## 2023-02-26 ENCOUNTER — Ambulatory Visit: Payer: 59 | Attending: Hematology and Oncology

## 2023-02-26 ENCOUNTER — Encounter: Payer: Self-pay | Admitting: Hematology and Oncology

## 2023-02-26 DIAGNOSIS — R293 Abnormal posture: Secondary | ICD-10-CM | POA: Diagnosis present

## 2023-02-26 DIAGNOSIS — M25612 Stiffness of left shoulder, not elsewhere classified: Secondary | ICD-10-CM | POA: Diagnosis present

## 2023-02-26 DIAGNOSIS — Z170 Estrogen receptor positive status [ER+]: Secondary | ICD-10-CM | POA: Insufficient documentation

## 2023-02-26 DIAGNOSIS — R222 Localized swelling, mass and lump, trunk: Secondary | ICD-10-CM | POA: Insufficient documentation

## 2023-02-26 DIAGNOSIS — C50411 Malignant neoplasm of upper-outer quadrant of right female breast: Secondary | ICD-10-CM | POA: Insufficient documentation

## 2023-02-26 DIAGNOSIS — M25611 Stiffness of right shoulder, not elsewhere classified: Secondary | ICD-10-CM | POA: Diagnosis present

## 2023-02-26 NOTE — Therapy (Signed)
OUTPATIENT PHYSICAL THERAPY BREAST CANCER TREATMENT   Patient Name: Elizabeth Benton MRN: [id number] DOB:1946-10-12, 77 y.o., female Today's Date: 02/26/2023  END OF SESSION:  PT End of Session - 02/26/23 1101     Visit Number 12    Number of Visits 21    Date for PT Re-Evaluation 03/31/23    PT Start Time 1102    PT Stop Time 1200    PT Time Calculation (min) 58 min    Activity Tolerance Patient tolerated treatment well    Behavior During Therapy WFL for tasks assessed/performed              Past Medical History:  Diagnosis Date   Arthritis    Breast cancer ([place])    Depression    H/O migraine    HSV-2 (herpes simplex virus 2) infection    Hyperlipidemia    Hypertension    Lichen sclerosus    Migraine    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   Vaginal atrophy 12/29/2007   Vaginitis and vulvovaginitis 12/28/2004   Past Surgical History:  Procedure Laterality Date   ABDOMINAL HYSTERECTOMY  1987   BREAST RECONSTRUCTION WITH PLACEMENT OF TISSUE EXPANDER AND FLEX HD (ACELLULAR HYDRATED DERMIS) Bilateral 10/20/2022   Procedure: BREAST RECONSTRUCTION WITH PLACEMENT OF TISSUE EXPANDER AND FLEX HD (ACELLULAR HYDRATED DERMIS);  Surgeon: Lennice Sites, MD;  Location: [facility];  Service: Plastics;  Laterality: Bilateral;   DILATION AND CURETTAGE OF UTERUS     MASTECTOMY W/ SENTINEL NODE BIOPSY Right 10/20/2022   Procedure: RIGHT MASTECTOMY WITH RIGHT SENTINEL LYMPH NODE BIOPSY;  Surgeon: Stark Klein, MD;  Location: [facility];  Service: General;  Laterality: Right;   REMOVAL OF BILATERAL TISSUE EXPANDERS WITH PLACEMENT OF BILATERAL BREAST IMPLANTS Bilateral 01/08/2023   Procedure: Removal of bilateral tissue expanders;  Surgeon: [name], MD;  Location: [facility];  Service: Plastics;  Laterality: Bilateral;   TONSILLECTOMY     TOTAL MASTECTOMY Left 10/20/2022   Procedure: LEFT TOTAL MASTECTOMY;  Surgeon: Stark Klein, MD;  Location: [facility];  Service: General;   Laterality: Left;   TUBAL LIGATION     WISDOM TOOTH EXTRACTION     Patient Active Problem List   Diagnosis Date Noted   S/P breast reconstruction 10/20/2022   Genetic testing 08/24/2022   Family history of breast cancer 08/06/2022   Family history of prostate cancer 08/06/2022   Malignant neoplasm of upper-outer quadrant of right breast in female, estrogen receptor positive ([place]) 07/31/2022   Abnormal glucose level 12/05/2020   Constipation 12/05/2020   Lumbago with sciatica 12/05/2020   Osteopenia 12/05/2020   Overweight 12/05/2020   Screening for malignant neoplasm of colon 12/05/2020   Vitamin D deficiency 12/05/2020   Essential hypertension 11/30/2019   LBBB (left bundle branch block) 11/30/2019   Exertional dyspnea 99991111   Lichen sclerosus 99991111   Mixed hyperlipidemia    HSV-2 (herpes simplex virus 2) infection    Depression    H/O migraine    Monilia infection    Vaginal atrophy    Wears glasses 06/23/2011   Sinus problem/runny nose 06/23/2011   Mass on back 06/23/2011    PCP: Willey Blade, MD  REFERRING PROVIDER: Nicholas Lose, MD  REFERRING DIAG: Right Breast Cancer  THERAPY DIAG:  Malignant neoplasm of upper-outer quadrant of right breast in female, estrogen receptor positive ([place])  Abnormal posture  Stiffness of left shoulder, not elsewhere classified  Stiffness of right shoulder, not elsewhere classified  Localized swelling of chest  wall  Rationale for Evaluation and Treatment: Rehabilitation  ONSET DATE: 07/24/2022  SUBJECTIVE:                                                                                                                                                                                           SUBJECTIVE STATEMENT Very little fluid in left chest so he didn't do anything else. I see him again in 4 weeks or so. I got my prosthesis and they feel really good. Still get sore at lateral trunk area under armpits, and in chest  area. Still have some discomfort with turning to drive  PERTINENT HISTORY:  Patient was diagnosed on 07/24/2022 with right grade 2 Invasive Lobular Carcinoma. It measures 1.2 cm and is located in the upper-outer quadrant. It is ER 95%, PR 80% and HER 2 negative with a Ki67 of 40%. She is s/p a Bilateral Mastectomy on 10/20/2022  for Right Breast Cancer and with left prophylactic and bilateral tissue expanders .  She has since had the expanders removed  and bilateral subtotal capsulotomies on Jan. 12,2024. She had 90 cc  of fluid aspirated on 01/28/2023,and 50 cc of  fluid aspirated 02/08/2023 from the left breast area  PATIENT GOALS:  Reassess how my recovery is going related to arm function, pain, and swelling since expander removal and capsulectomies  PAIN:  Are you having pain?  PAIN:  Are you having pain? Yes mostly soreness NPRS scale: 1-2/10 discomfort bilateral trunk region under armpits, and lower rib cage. Pain location: both lateral trunk,  under arm pits Pain orientation: Right and left PAIN TYPE: tight and bruising sort of, heavy at end of day Pain description: constant  Aggravating factors: Relieving factors: compression, MLD, tumeric  PRECAUTIONS: Recent Surgery, right UE Lymphedema risk, DDD, LBP  ACTIVITY LEVEL / LEISURE: ,  not returned to work, goes back March 4   OBJECTIVE:   PATIENT SURVEYS:  QUICK DASH: Initial eval 61.36,  Re-eval  39%  OBSERVATIONS: Pt with larger right breast  than secondary to expander filled more. Mastectomy incisions are healed with glue still present. Left axillary region with area of swelling/tissue greater than right. Pt will be having an [place] today on the left. Having expanders removed in early January.  02/17/2023 Ballotable fluid noted left chest region, Right incision area irregular and folding inward. Left incision healed but also irregular without folding noted  POSTURE:  Forward head, rounded shoulders  LYMPHEDEMA ASSESSMENT:      UPPER EXTREMITY AROM/PROM:   A/PROM RIGHT   eval   Right 12/02/2022 RIGHT 12/29/2022 RIGHT 01/05/2023 Right Re-eval  Shoulder extension 60 49 63  57 tight axilla  Flexion      128 axilla  Shoulder abduction 168 68 145 149 tight axilla 130  Shoulder internal rotation 55    60  Shoulder external rotation 112    98                          (Blank rows = not tested)   A/PROM LEFT   eval LEFT 2023 LEFT 12/29/2022 LEFT Re-eval  Shoulder extension 58 46 65 52 tight  Shoulder flexion 159 120 160 155 tight chest  Shoulder abduction 165 90 156 123 tight axilla  Shoulder internal rotation 65   55  Shoulder external rotation 112   88                          (Blank rows = not tested)     CERVICAL AROM: All within functional  limits: Decreased 20 % Rotation and 50% SB         UPPER EXTREMITY STRENGTH: WNL     LYMPHEDEMA ASSESSMENTS:    LANDMARK RIGHT   eval Right 12/02/2022 RIGHT 02/17/2023  10 cm proximal to olecranon process 28.5 28.7 28.6  Olecranon process 24.6 24.6 24.6  10 cm proximal to ulnar styloid process 21.8 21.7 21.4  Just proximal to ulnar styloid process 16.4 16.3 16.4  Across hand at thumb web space 18.3 18.2 18.4  At base of 2nd digit 6.5 6.6 6.4  (Blank rows = not tested)   LANDMARK LEFT   eval LEFT 12/02/2022  10 cm proximal  29.3 29.7  Olecranon process  24.9 24.7  10 cm proximal to ulnar styloid process 21.0 21.4  Just proximal to ulnar styloid process 16.3 16.3  Across hand at thumb web space 18.0 17.9  At base of 2nd digit 6.45 6.4  (Blank rows = not tested)      Surgery type/Date: 10/20/2022 bilateral Mastectomy with right SLNB, 01/08/2023 Removal of bilateral tissue expanders and bilateral capsulectomies Number of lymph nodes removed: 0/2 right Current/past treatment (chemo, radiation, hormone therapy): No chemo required, will start anti-estrogen therapy Other symptoms:  Heaviness/tightness Yes Pain Yes Pitting edema No Infections  No Decreased scar mobility Yes Stemmer sign No  TREATMENT TODAY  02/26/2023  STM bilateral UT, pectorals, lats with cocoa butter PROM bilateral shoulder flexion, scaption, abduction, ER with intermittent MFR at axilla for cording MFR bilateral chest region AROM supine bilateral Flexion, scaption, horizontal abduction x 5 Corner pec stretch and doorway pecs stretch x 3-4 ea, SB stretch x 2 ea Updated HEP with stretches  02/24/2023  Overhead pulleys x 1:30 flexion, scaption, abduction, ball rolls on wall x 5 flexion x 5 bilateral abduction STM bilateral UT, pectorals, lats with cocoa butter Supine wand flexion and scaption x 5  PROM bilateral shoulder flexion, scaption, abduction, ER with intermittent MFR at axilla MFR bilateral chest region Lower trunk rotation x 5 ea direction with arms outstretched   02/19/2023  Overhead pulleys x 1:30 flexion, scaption, abduction, ball rolls on wall x 8 flexion x 5 bilateral abduction Supine wand flexion and scaption 5 x 5 sec  PROM bilateral shoulder flexion, scaption, abduction, ER with intermittent MFR at axilla Lower trunk rotation x 5 ea direction with arms outstretched MLD To short neck, left axillary and inguinal LN's and left axillo-inguinal pathway to decrease swelling, Repeated on right side to decrease edema at axillary region/trunk  02/17/2023 Large piece of foam  cut to cover the full width of her chest over incisions to help flatten incisions and decrease swelling and placed in large TG soft. Assisted pt with placement and fastening bra and patient reported that it felt much better with the foam. Advised to remove if she experiences any discomfort. Discussed resuming 4 post op exercises for ROM     12/10/2022 Soft Tissue mobilization to bilateral UT, pectorals and Lats with cocoa butter in supine PROM bilateral shoulder flexion, scaption, abd, IR and ER with multiple VC's to relax Supine AROM bilateral shoulder flexion,scaption,  horizontal abd in comfortable ROM x 5 ea  12/08/2022 Soft Tissue mobilization to bilateral UT, pectorals and Lats with cocoa butter in supine AAROM clasped hands flexion and stargazer x 5, wall slides x 5 PROM bilateral shoulder flexion, scaption, abd, IR and ER with multiple VC's to relax Tried supine wand x 2 but pt had trouble relaxing. Lower trunk rotation to the left x 4 to stretch lats/pecs gently   PATIENT EDUCATION:   Access Code: [email or web address] URL: [email or web address] Date: 02/26/2023 Prepared by: Cheral Almas  Exercises - Supine Lower Trunk Rotation  - 2 x daily - 7 x weekly - 1 sets - 3-4 reps - 20 hold - Corner Pec Major Stretch  - 2 x daily - 7 x weekly - 1 sets - 3-4 reps - 20 hold - Single Arm Doorway Pec Stretch at 90 Degrees Abduction  - 2 x daily - 7 x weekly - 1 sets - 3-4 reps - 20 hold - TL Sidebending Stretch - Single Arm Overhead  - 2 x daily - 7 x weekly - 1 sets - 3-4 reps Education details: Reviewed 4 post op exercises. Did better with abduction sliding hand on wall Person educated: Patient Education method: Explanation and Demonstration Education comprehension: returned demonstration    HOME EXERCISE PROGRAM: Reviewed previously given post op HEP. Changed wall walk to wall slide and pt demonstrated good improvement  ASSESSMENT:  CLINICAL IMPRESSION:  Pain levels remain low. Continued soft tissue mobilization which helps discomfort at lateral trunk. Bilateral pectorals tight so updated HEP with chest stretches, SB stretches and LTR.  Right shoulder ROM slightly tighter than left  Pt will benefit from skilled therapeutic intervention to improve on the following deficits: Decreased knowledge of precautions, impaired UE functional use, pain, decreased ROM, postural dysfunction., edema   PT treatment/interventions: ADL/Self care home management, Therapeutic exercises, Therapeutic activity, Neuromuscular re-education, Patient/Family education,  Self Care, Joint mobilization, Orthotic/Fit training, Dry Needling, Manual lymph drainage, scar mobilization, Manual therapy, and Re-evaluation   GOALS: Goals reviewed with patient? Yes  LONG TERM GOALS:  (STG=LTG)  GOALS Name Target Date  Goal status  1 Pt will demonstrate she has regained full shoulder ROM and function post operatively compared to baselines.  Baseline: 01/13/2023 03/31/2023 In progress  2 Pts quick dash will be improved to pre-surgery level of 36% 03/31/2023 In progress  3 Pt will be able to dress and bathe and perform light household chores independently 01/13/2023 MET 12/29/2022  4 Pt will have decreased pain by 50% overall 01/13/2023 03/31/2023 MET 12/29/2022 Not met since new surgery IN PROGRESS  5. Pt will resume usual home and work activities with minimal complaint 03/31/2023      PLAN:  PT FREQUENCY/DURATION: 2x/week x 6 weeks  PLAN FOR NEXT SESSION: Check swelling at chest, pulleys,STM bilateral UQ, AAROM, PROM bilateral shoulders,  ABC strength handout,MLD prn especially to left  chest axillary region,   [facility]  [address], [address]  [place]  [address]  [phone]  After Breast Cancer Class It is recommended you attend the ABC class to be educated on lymphedema risk reduction. This class is free of charge and lasts for 1 hour. It is a 1-time class. You will need to download the Webex app either on your phone or computer. We will send you a link the night before or the morning of the class. You should be able to click on that link to join the class. This is not a confidential class. You don't have to turn your camera on, but other participants may be able to see your email address.  Scar massage You can begin gentle scar massage to you incision sites. Gently place one hand on the incision and move the skin (without sliding on the skin) in various directions. Do this for a few minutes and then you can gently massage either coconut  oil or vitamin E cream into the scars.  Compression garment You should continue wearing your compression bra until you feel like you no longer have swelling.  Home exercise Program Continue doing the exercises you were given until you feel like you can do them without feeling any tightness at the end.   Walking Program Studies show that 30 minutes of walking per day (fast enough to elevate your heart rate) can significantly reduce the risk of a cancer recurrence. If you can't walk due to other medical reasons, we encourage you to find another activity you could do (like a stationary bike or water exercise).  Posture After breast cancer surgery, people frequently sit with rounded shoulders posture because it puts their incisions on slack and feels better. If you sit like this and scar tissue forms in that position, you can become very tight and have pain sitting or standing with good posture. Try to be aware of your posture and sit and stand up tall to heal properly.  Follow up PT: It is recommended you return every 3 months for the first 3 years following surgery to be assessed on the SOZO machine for an L-Dex score. This helps prevent clinically significant lymphedema in 95% of patients. These follow up screens are 10 minute appointments that you are not billed for.  Claris Pong, PT 02/26/2023, 12:11 PM

## 2023-03-02 ENCOUNTER — Ambulatory Visit: Payer: 59

## 2023-03-02 DIAGNOSIS — Z170 Estrogen receptor positive status [ER+]: Secondary | ICD-10-CM

## 2023-03-02 DIAGNOSIS — R222 Localized swelling, mass and lump, trunk: Secondary | ICD-10-CM

## 2023-03-02 DIAGNOSIS — M25612 Stiffness of left shoulder, not elsewhere classified: Secondary | ICD-10-CM

## 2023-03-02 DIAGNOSIS — M25611 Stiffness of right shoulder, not elsewhere classified: Secondary | ICD-10-CM

## 2023-03-02 DIAGNOSIS — R293 Abnormal posture: Secondary | ICD-10-CM

## 2023-03-02 DIAGNOSIS — C50411 Malignant neoplasm of upper-outer quadrant of right female breast: Secondary | ICD-10-CM | POA: Diagnosis not present

## 2023-03-02 NOTE — Patient Instructions (Signed)

## 2023-03-02 NOTE — Therapy (Signed)
OUTPATIENT PHYSICAL THERAPY BREAST CANCER TREATMENT   Patient Name: Elizabeth Benton MRN: [id number] DOB:Mar 25, 1946, 77 y.o., female Today's Date: 03/02/2023  END OF SESSION:  PT End of Session - 03/02/23 1103     Visit Number 13    Number of Visits 21    Date for PT Re-Evaluation 03/31/23    PT Start Time 1104    PT Stop Time 1155    PT Time Calculation (min) 51 min    Activity Tolerance Patient tolerated treatment well    Behavior During Therapy [facility] for tasks assessed/performed              Past Medical History:  Diagnosis Date   Arthritis    Breast cancer ([place])    Depression    H/O migraine    HSV-2 (herpes simplex virus 2) infection    Hyperlipidemia    Hypertension    Lichen sclerosus    Migraine    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   Vaginal atrophy 12/29/2007   Vaginitis and vulvovaginitis 12/28/2004   Past Surgical History:  Procedure Laterality Date   ABDOMINAL HYSTERECTOMY  1987   BREAST RECONSTRUCTION WITH PLACEMENT OF TISSUE EXPANDER AND FLEX HD (ACELLULAR HYDRATED DERMIS) Bilateral 10/20/2022   Procedure: BREAST RECONSTRUCTION WITH PLACEMENT OF TISSUE EXPANDER AND FLEX HD (ACELLULAR HYDRATED DERMIS);  Surgeon: Lennice Sites, MD;  Location: [place];  Service: Plastics;  Laterality: Bilateral;   DILATION AND CURETTAGE OF UTERUS     MASTECTOMY W/ SENTINEL NODE BIOPSY Right 10/20/2022   Procedure: RIGHT MASTECTOMY WITH RIGHT SENTINEL LYMPH NODE BIOPSY;  Surgeon: Stark Klein, MD;  Location: [place];  Service: General;  Laterality: Right;   REMOVAL OF BILATERAL TISSUE EXPANDERS WITH PLACEMENT OF BILATERAL BREAST IMPLANTS Bilateral 01/08/2023   Procedure: Removal of bilateral tissue expanders;  Surgeon: [name], MD;  Location: [place];  Service: Plastics;  Laterality: Bilateral;   TONSILLECTOMY     TOTAL MASTECTOMY Left 10/20/2022   Procedure: LEFT TOTAL MASTECTOMY;  Surgeon: Stark Klein, MD;  Location: [place];  Service: General;   Laterality: Left;   TUBAL LIGATION     WISDOM TOOTH EXTRACTION     Patient Active Problem List   Diagnosis Date Noted   S/P breast reconstruction 10/20/2022   Genetic testing 08/24/2022   Family history of breast cancer 08/06/2022   Family history of prostate cancer 08/06/2022   Malignant neoplasm of upper-outer quadrant of right breast in female, estrogen receptor positive ([place]) 07/31/2022   Abnormal glucose level 12/05/2020   Constipation 12/05/2020   Lumbago with sciatica 12/05/2020   Osteopenia 12/05/2020   Overweight 12/05/2020   Screening for malignant neoplasm of colon 12/05/2020   Vitamin D deficiency 12/05/2020   Essential hypertension 11/30/2019   LBBB (left bundle branch block) 11/30/2019   Exertional dyspnea 99991111   Lichen sclerosus 99991111   Mixed hyperlipidemia    HSV-2 (herpes simplex virus 2) infection    Depression    H/O migraine    Monilia infection    Vaginal atrophy    Wears glasses 06/23/2011   Sinus problem/runny nose 06/23/2011   Mass on back 06/23/2011    PCP: Willey Blade, MD  REFERRING PROVIDER: Nicholas Lose, MD  REFERRING DIAG: Right Breast Cancer  THERAPY DIAG:  Malignant neoplasm of upper-outer quadrant of right breast in female, estrogen receptor positive ([place])  Abnormal posture  Stiffness of left shoulder, not elsewhere classified  Stiffness of right shoulder, not elsewhere classified  Localized swelling of chest  wall  Rationale for Evaluation and Treatment: Rehabilitation  ONSET DATE: 07/24/2022  SUBJECTIVE:                                                                                                                                                                                           SUBJECTIVE STATEMENT I have been doing the stretches at home and I can tell a difference, but it is still tight. Turning my head to drive I still feel like I have to turn my body.  My upper chest feels "yucky" when I am in the  bed, but I don't notice it when I am busy in the daytime.  PERTINENT HISTORY:  Patient was diagnosed on 07/24/2022 with right grade 2 Invasive Lobular Carcinoma. It measures 1.2 cm and is located in the upper-outer quadrant. It is ER 95%, PR 80% and HER 2 negative with a Ki67 of 40%. She is s/p a Bilateral Mastectomy on 10/20/2022  for Right Breast Cancer and with left prophylactic and bilateral tissue expanders .  She has since had the expanders removed  and bilateral subtotal capsulotomies on Jan. 12,2024. She had 90 cc  of fluid aspirated on 01/28/2023,and 50 cc of  fluid aspirated 02/08/2023 from the left breast area  PATIENT GOALS:  Reassess how my recovery is going related to arm function, pain, and swelling since expander removal and capsulectomies  PAIN:  Are you having pain?  PAIN:  Are you having pain? Yes mostly soreness NPRS scale: 1-2/10 discomfort bilateral trunk region under armpits, and lower rib cage. Pain location: both lateral trunk,  under arm pits Pain orientation: Right and left PAIN TYPE: tight and bruising sort of, heavy at end of day Pain description: constant  Aggravating factors: Relieving factors: compression, MLD, tumeric  PRECAUTIONS: Recent Surgery, right UE Lymphedema risk, DDD, LBP  ACTIVITY LEVEL / LEISURE: ,  not returned to work, goes back March 4   OBJECTIVE:   PATIENT SURVEYS:  QUICK DASH: Initial eval 61.36,  Re-eval  39%  OBSERVATIONS: Pt with larger right breast  than secondary to expander filled more. Mastectomy incisions are healed with glue still present. Left axillary region with area of swelling/tissue greater than right. Pt will be having an [place] today on the left. Having expanders removed in early January.  02/17/2023 Ballotable fluid noted left chest region, Right incision area irregular and folding inward. Left incision healed but also irregular without folding noted  POSTURE:  Forward head, rounded shoulders  LYMPHEDEMA ASSESSMENT:      UPPER EXTREMITY AROM/PROM:   A/PROM RIGHT   eval   Right 12/02/2022 RIGHT 12/29/2022 RIGHT 01/05/2023 Right  Re-eval RIGHT 03/02/2023  Shoulder extension 60 49 63   57 tight axilla 60  Flexion      128 axilla 143  Shoulder abduction 168 68 145 149 tight axilla 130 143  Shoulder internal rotation 55    60 70  Shoulder external rotation 112    98 109                          (Blank rows = not tested)   A/PROM LEFT   eval LEFT 2023 LEFT 12/29/2022 LEFT Re-eval LEFT 03/02/2023  Shoulder extension 58 46 65 52 tight 60  Shoulder flexion 159 120 160 155 tight chest 160  Shoulder abduction 165 90 156 123 tight axilla 158  Shoulder internal rotation 65   55 65  Shoulder external rotation 112   88 107                          (Blank rows = not tested)     CERVICAL AROM: All within functional  limits: Decreased 20 % Rotation and 50% SB         UPPER EXTREMITY STRENGTH: WNL     LYMPHEDEMA ASSESSMENTS:    LANDMARK RIGHT   eval Right 12/02/2022 RIGHT 02/17/2023  10 cm proximal to olecranon process 28.5 28.7 28.6  Olecranon process 24.6 24.6 24.6  10 cm proximal to ulnar styloid process 21.8 21.7 21.4  Just proximal to ulnar styloid process 16.4 16.3 16.4  Across hand at thumb web space 18.3 18.2 18.4  At base of 2nd digit 6.5 6.6 6.4  (Blank rows = not tested)   LANDMARK LEFT   eval LEFT 12/02/2022  10 cm proximal  29.3 29.7  Olecranon process  24.9 24.7  10 cm proximal to ulnar styloid process 21.0 21.4  Just proximal to ulnar styloid process 16.3 16.3  Across hand at thumb web space 18.0 17.9  At base of 2nd digit 6.45 6.4  (Blank rows = not tested)      Surgery type/Date: 10/20/2022 bilateral Mastectomy with right SLNB, 01/08/2023 Removal of bilateral tissue expanders and bilateral capsulectomies Number of lymph nodes removed: 0/2 right Current/past treatment (chemo, radiation, hormone therapy): No chemo required, will start anti-estrogen therapy Other symptoms:   Heaviness/tightness Yes Pain Yes Pitting edema No Infections No Decreased scar mobility Yes Stemmer sign No  TREATMENT TODAY  03/02/2023 STM bilateral UT, pectorals, lats with cocoa butter PROM bilateral shoulder flexion, scaption, abduction, ER with intermittent MFR at axilla for cording MFR bilateral chest region bilaterally Supine scapular series x 10 except sword with yellow band Updated HEP with band exercises Measured ROM 02/26/2023  STM bilateral UT, pectorals, lats with cocoa butter PROM bilateral shoulder flexion, scaption, abduction, ER with intermittent MFR at axilla for cording MFR bilateral chest region AROM supine bilateral Flexion, scaption, horizontal abduction x 5 Corner pec stretch and doorway pecs stretch x 3-4 ea, SB stretch x 2 ea Updated HEP with stretches  02/24/2023  Overhead pulleys x 1:30 flexion, scaption, abduction, ball rolls on wall x 5 flexion x 5 bilateral abduction STM bilateral UT, pectorals, lats with cocoa butter Supine wand flexion and scaption x 5  PROM bilateral shoulder flexion, scaption, abduction, ER with intermittent MFR at axilla MFR bilateral chest region Lower trunk rotation x 5 ea direction with arms outstretched   02/19/2023  Overhead pulleys x 1:30 flexion, scaption, abduction, ball rolls on wall x 8 flexion  x 5 bilateral abduction Supine wand flexion and scaption 5 x 5 sec  PROM bilateral shoulder flexion, scaption, abduction, ER with intermittent MFR at axilla Lower trunk rotation x 5 ea direction with arms outstretched MLD To short neck, left axillary and inguinal LN's and left axillo-inguinal pathway to decrease swelling, Repeated on right side to decrease edema at axillary region/trunk  02/17/2023 Large piece of foam cut to cover the full width of her chest over incisions to help flatten incisions and decrease swelling and placed in large TG soft. Assisted pt with placement and fastening bra and patient reported that it felt  much better with the foam. Advised to remove if she experiences any discomfort. Discussed resuming 4 post op exercises for ROM     12/10/2022 Soft Tissue mobilization to bilateral UT, pectorals and Lats with cocoa butter in supine PROM bilateral shoulder flexion, scaption, abd, IR and ER with multiple VC's to relax Supine AROM bilateral shoulder flexion,scaption, horizontal abd in comfortable ROM x 5 ea  12/08/2022 Soft Tissue mobilization to bilateral UT, pectorals and Lats with cocoa butter in supine AAROM clasped hands flexion and stargazer x 5, wall slides x 5 PROM bilateral shoulder flexion, scaption, abd, IR and ER with multiple VC's to relax Tried supine wand x 2 but pt had trouble relaxing. Lower trunk rotation to the left x 4 to stretch lats/pecs gently   PATIENT EDUCATION:   Access Code: [email or web address] URL: [email or web address] Date: 02/26/2023 Prepared by: Cheral Almas  Exercises - Supine Lower Trunk Rotation  - 2 x daily - 7 x weekly - 1 sets - 3-4 reps - 20 hold - Corner Pec Major Stretch  - 2 x daily - 7 x weekly - 1 sets - 3-4 reps - 20 hold - Single Arm Doorway Pec Stretch at 90 Degrees Abduction  - 2 x daily - 7 x weekly - 1 sets - 3-4 reps - 20 hold - TL Sidebending Stretch - Single Arm Overhead  - 2 x daily - 7 x weekly - 1 sets - 3-4 reps Education details: Reviewed 4 post op exercises. Did better with abduction sliding hand on wall Person educated: Patient Education method: Explanation and Demonstration Education comprehension: returned demonstration    HOME EXERCISE PROGRAM: Reviewed previously given post op HEP. Changed wall walk to wall slide and pt demonstrated good improvement Supine scapular series except sword x 10 with yellow ASSESSMENT:  CLINICAL IMPRESSION:  ROM progressing well with left shoulder nearly WNL and right shoulder improved but still limited. Pt did well with band exercises instructed and HEP was updated.Pt continues to  feel tightness with turning to drive  Pt will benefit from skilled therapeutic intervention to improve on the following deficits: Decreased knowledge of precautions, impaired UE functional use, pain, decreased ROM, postural dysfunction., edema   PT treatment/interventions: ADL/Self care home management, Therapeutic exercises, Therapeutic activity, Neuromuscular re-education, Patient/Family education, Self Care, Joint mobilization, Orthotic/Fit training, Dry Needling, Manual lymph drainage, scar mobilization, Manual therapy, and Re-evaluation   GOALS: Goals reviewed with patient? Yes  LONG TERM GOALS:  (STG=LTG)  GOALS Name Target Date  Goal status  1 Pt will demonstrate she has regained full shoulder ROM and function post operatively compared to baselines.  Baseline: 01/13/2023 03/31/2023 In progress  2 Pts quick dash will be improved to pre-surgery level of 36% 03/31/2023 In progress  3 Pt will be able to dress and bathe and perform light household chores independently 01/13/2023 MET 12/29/2022  4 Pt will have decreased  pain by 50% overall 01/13/2023 03/31/2023 MET 12/29/2022 Not met since new surgery IN PROGRESS  5. Pt will resume usual home and work activities with minimal complaint 03/31/2023      PLAN:  PT FREQUENCY/DURATION: 2x/week x 6 weeks  PLAN FOR NEXT SESSION: Check swelling at chest, pulleys,STM bilateral UQ, AAROM, PROM bilateral shoulders,  ABC strength handout,MLD prn especially to left  chest axillary region,   [facility]  [address]  [place] [address]  [phone]  After Breast Cancer Class It is recommended you attend the ABC class to be educated on lymphedema risk reduction. This class is free of charge and lasts for 1 hour. It is a 1-time class. You will need to download the Webex app either on your phone or computer. We will send you a link the night before or the morning of the class. You should be able to click on that link to  join the class. This is not a confidential class. You don't have to turn your camera on, but other participants may be able to see your email address.  Scar massage You can begin gentle scar massage to you incision sites. Gently place one hand on the incision and move the skin (without sliding on the skin) in various directions. Do this for a few minutes and then you can gently massage either coconut oil or vitamin E cream into the scars.  Compression garment You should continue wearing your compression bra until you feel like you no longer have swelling.  Home exercise Program Continue doing the exercises you were given until you feel like you can do them without feeling any tightness at the end.   Walking Program Studies show that 30 minutes of walking per day (fast enough to elevate your heart rate) can significantly reduce the risk of a cancer recurrence. If you can't walk due to other medical reasons, we encourage you to find another activity you could do (like a stationary bike or water exercise).  Posture After breast cancer surgery, people frequently sit with rounded shoulders posture because it puts their incisions on slack and feels better. If you sit like this and scar tissue forms in that position, you can become very tight and have pain sitting or standing with good posture. Try to be aware of your posture and sit and stand up tall to heal properly.  Follow up PT: It is recommended you return every 3 months for the first 3 years following surgery to be assessed on the SOZO machine for an L-Dex score. This helps prevent clinically significant lymphedema in 95% of patients. These follow up screens are 10 minute appointments that you are not billed for.  Claris Pong, PT 03/02/2023, 12:00 PM

## 2023-03-04 ENCOUNTER — Ambulatory Visit: Payer: 59

## 2023-03-04 DIAGNOSIS — M25612 Stiffness of left shoulder, not elsewhere classified: Secondary | ICD-10-CM

## 2023-03-04 DIAGNOSIS — M25611 Stiffness of right shoulder, not elsewhere classified: Secondary | ICD-10-CM

## 2023-03-04 DIAGNOSIS — R293 Abnormal posture: Secondary | ICD-10-CM

## 2023-03-04 DIAGNOSIS — Z170 Estrogen receptor positive status [ER+]: Secondary | ICD-10-CM

## 2023-03-04 DIAGNOSIS — R222 Localized swelling, mass and lump, trunk: Secondary | ICD-10-CM

## 2023-03-04 DIAGNOSIS — C50411 Malignant neoplasm of upper-outer quadrant of right female breast: Secondary | ICD-10-CM | POA: Diagnosis not present

## 2023-03-04 NOTE — Therapy (Signed)
OUTPATIENT PHYSICAL THERAPY BREAST CANCER TREATMENT   Patient Name: Elizabeth Benton MRN: [id number] DOB:17-Dec-1946, 77 y.o., female Today's Date: 03/04/2023  END OF SESSION:  PT End of Session - 03/04/23 1054     Visit Number 14    Number of Visits 21    Date for PT Re-Evaluation 03/31/23    PT Start Time 1055    PT Stop Time 1150    PT Time Calculation (min) 55 min    Activity Tolerance Patient tolerated treatment well    Behavior During Therapy [facility] for tasks assessed/performed              Past Medical History:  Diagnosis Date   Arthritis    Breast cancer ([place])    Depression    H/O migraine    HSV-2 (herpes simplex virus 2) infection    Hyperlipidemia    Hypertension    Lichen sclerosus    Migraine    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   Vaginal atrophy 12/29/2007   Vaginitis and vulvovaginitis 12/28/2004   Past Surgical History:  Procedure Laterality Date   ABDOMINAL HYSTERECTOMY  1987   BREAST RECONSTRUCTION WITH PLACEMENT OF TISSUE EXPANDER AND FLEX HD (ACELLULAR HYDRATED DERMIS) Bilateral 10/20/2022   Procedure: BREAST RECONSTRUCTION WITH PLACEMENT OF TISSUE EXPANDER AND FLEX HD (ACELLULAR HYDRATED DERMIS);  Surgeon: Lennice Sites, MD;  Location: [place];  Service: Plastics;  Laterality: Bilateral;   DILATION AND CURETTAGE OF UTERUS     MASTECTOMY W/ SENTINEL NODE BIOPSY Right 10/20/2022   Procedure: RIGHT MASTECTOMY WITH RIGHT SENTINEL LYMPH NODE BIOPSY;  Surgeon: Stark Klein, MD;  Location: [place];  Service: General;  Laterality: Right;   REMOVAL OF BILATERAL TISSUE EXPANDERS WITH PLACEMENT OF BILATERAL BREAST IMPLANTS Bilateral 01/08/2023   Procedure: Removal of bilateral tissue expanders;  Surgeon: [name], MD;  Location: [place];  Service: Plastics;  Laterality: Bilateral;   TONSILLECTOMY     TOTAL MASTECTOMY Left 10/20/2022   Procedure: LEFT TOTAL MASTECTOMY;  Surgeon: Stark Klein, MD;  Location: [place];  Service: General;   Laterality: Left;   TUBAL LIGATION     WISDOM TOOTH EXTRACTION     Patient Active Problem List   Diagnosis Date Noted   S/P breast reconstruction 10/20/2022   Genetic testing 08/24/2022   Family history of breast cancer 08/06/2022   Family history of prostate cancer 08/06/2022   Malignant neoplasm of upper-outer quadrant of right breast in female, estrogen receptor positive ([place]) 07/31/2022   Abnormal glucose level 12/05/2020   Constipation 12/05/2020   Lumbago with sciatica 12/05/2020   Osteopenia 12/05/2020   Overweight 12/05/2020   Screening for malignant neoplasm of colon 12/05/2020   Vitamin D deficiency 12/05/2020   Essential hypertension 11/30/2019   LBBB (left bundle branch block) 11/30/2019   Exertional dyspnea 99991111   Lichen sclerosus 99991111   Mixed hyperlipidemia    HSV-2 (herpes simplex virus 2) infection    Depression    H/O migraine    Monilia infection    Vaginal atrophy    Wears glasses 06/23/2011   Sinus problem/runny nose 06/23/2011   Mass on back 06/23/2011    PCP: Willey Blade, MD  REFERRING PROVIDER: Nicholas Lose, MD  REFERRING DIAG: Right Breast Cancer  THERAPY DIAG:  Malignant neoplasm of upper-outer quadrant of right breast in female, estrogen receptor positive ([place])  Abnormal posture  Stiffness of left shoulder, not elsewhere classified  Stiffness of right shoulder, not elsewhere classified  Localized swelling of chest  wall  Rationale for Evaluation and Treatment: Rehabilitation  ONSET DATE: 07/24/2022  SUBJECTIVE:                                                                                                                                                                                           SUBJECTIVE STATEMENT I love the bands. They are making a difference. Rib cage is still sore but getting better.  PERTINENT HISTORY:  Patient was diagnosed on 07/24/2022 with right grade 2 Invasive Lobular Carcinoma. It measures  1.2 cm and is located in the upper-outer quadrant. It is ER 95%, PR 80% and HER 2 negative with a Ki67 of 40%. She is s/p a Bilateral Mastectomy on 10/20/2022  for Right Breast Cancer and with left prophylactic and bilateral tissue expanders .  She has since had the expanders removed  and bilateral subtotal capsulotomies on Jan. 12,2024. She had 90 cc  of fluid aspirated on 01/28/2023,and 50 cc of  fluid aspirated 02/08/2023 from the left breast area  PATIENT GOALS:  Reassess how my recovery is going related to arm function, pain, and swelling since expander removal and capsulectomies  PAIN:  Are you having pain?  PAIN:  Are you having pain? Yes mostly soreness NPRS scale: 1/10 discomfort bilateral trunk region under armpits, and lower rib cage. Pain location: both lateral trunk,  under arm pits Pain orientation: Right and left PAIN TYPE: tight and bruising sort of, heavy at end of day Pain description: constant  Aggravating factors: Relieving factors: compression, MLD, tumeric  PRECAUTIONS: Recent Surgery, right UE Lymphedema risk, DDD, LBP  ACTIVITY LEVEL / LEISURE: ,  not returned to work, goes back March 4   OBJECTIVE:   PATIENT SURVEYS:  QUICK DASH: Initial eval 61.36,  Re-eval  39%  OBSERVATIONS: Pt with larger right breast  than secondary to expander filled more. Mastectomy incisions are healed with glue still present. Left axillary region with area of swelling/tissue greater than right. Pt will be having an [place] today on the left. Having expanders removed in [place] January.  02/17/2023 Ballotable fluid noted left chest region, Right incision area irregular and folding inward. Left incision healed but also irregular without folding noted  POSTURE:  Forward head, rounded shoulders  LYMPHEDEMA ASSESSMENT:     UPPER EXTREMITY AROM/PROM:   A/PROM RIGHT   eval   Right 12/02/2022 RIGHT 12/29/2022 RIGHT 01/05/2023 Right Re-eval RIGHT 03/02/2023  Shoulder extension 60 49 63   57 tight  axilla 60  Flexion      128 axilla 143  Shoulder abduction 168 68 145 149 tight axilla 130 143  Shoulder internal rotation 55  60 70  Shoulder external rotation 112    98 109                          (Blank rows = not tested)   A/PROM LEFT   eval LEFT 2023 LEFT 12/29/2022 LEFT Re-eval LEFT 03/02/2023  Shoulder extension 58 46 65 52 tight 60  Shoulder flexion 159 120 160 155 tight chest 160  Shoulder abduction 165 90 156 123 tight axilla 158  Shoulder internal rotation 65   55 65  Shoulder external rotation 112   88 107                          (Blank rows = not tested)     CERVICAL AROM: All within functional  limits: Decreased 20 % Rotation and 50% SB         UPPER EXTREMITY STRENGTH: WNL     LYMPHEDEMA ASSESSMENTS:    LANDMARK RIGHT   eval Right 12/02/2022 RIGHT 02/17/2023  10 cm proximal to olecranon process 28.5 28.7 28.6  Olecranon process 24.6 24.6 24.6  10 cm proximal to ulnar styloid process 21.8 21.7 21.4  Just proximal to ulnar styloid process 16.4 16.3 16.4  Across hand at thumb web space 18.3 18.2 18.4  At base of 2nd digit 6.5 6.6 6.4  (Blank rows = not tested)   LANDMARK LEFT   eval LEFT 12/02/2022  10 cm proximal  29.3 29.7  Olecranon process  24.9 24.7  10 cm proximal to ulnar styloid process 21.0 21.4  Just proximal to ulnar styloid process 16.3 16.3  Across hand at thumb web space 18.0 17.9  At base of 2nd digit 6.45 6.4  (Blank rows = not tested)      Surgery type/Date: 10/20/2022 bilateral Mastectomy with right SLNB, 01/08/2023 Removal of bilateral tissue expanders and bilateral capsulectomies Number of lymph nodes removed: 0/2 right Current/past treatment (chemo, radiation, hormone therapy): No chemo required, will start anti-estrogen therapy Other symptoms:  Heaviness/tightness Yes Pain Yes Pitting edema No Infections No Decreased scar mobility Yes Stemmer sign No  TREATMENT TODAY   03/04/2023 Pulleys x 1:30 flex, scaption, Abd,  Ball rolls flexion x 10, bilateral abd x 5 STM bilateral UT, pectorals, lats with cocoa butter PROM bilateral shoulder flexion, scaption, abduction, ER with intermittent MFR at axilla for cording MFR bilateral chest region bilaterally Supine scapular series x 10 including sword x 5 with yellow band   03/02/2023 STM bilateral UT, pectorals, lats with cocoa butter PROM bilateral shoulder flexion, scaption, abduction, ER with intermittent MFR at axilla for cording MFR bilateral chest region bilaterally Supine scapular series x 10 except sword with yellow band Updated HEP with band exercises Measured ROM 02/26/2023  STM bilateral UT, pectorals, lats with cocoa butter PROM bilateral shoulder flexion, scaption, abduction, ER with intermittent MFR at axilla for cording MFR bilateral chest region AROM supine bilateral Flexion, scaption, horizontal abduction x 5 Corner pec stretch and doorway pecs stretch x 3-4 ea, SB stretch x 2 ea Updated HEP with stretches  02/24/2023  Overhead pulleys x 1:30 flexion, scaption, abduction, ball rolls on wall x 5 flexion x 5 bilateral abduction STM bilateral UT, pectorals, lats with cocoa butter Supine wand flexion and scaption x 5  PROM bilateral shoulder flexion, scaption, abduction, ER with intermittent MFR at axilla MFR bilateral chest region Lower trunk rotation x 5 ea direction with arms outstretched   02/19/2023  Overhead pulleys x 1:30 flexion, scaption, abduction, ball rolls on wall x 8 flexion x 5 bilateral abduction Supine wand flexion and scaption 5 x 5 sec  PROM bilateral shoulder flexion, scaption, abduction, ER with intermittent MFR at axilla Lower trunk rotation x 5 ea direction with arms outstretched MLD To short neck, left axillary and inguinal LN's and left axillo-inguinal pathway to decrease swelling, Repeated on right side to decrease edema at axillary region/trunk  02/17/2023 Large piece of foam cut to cover the full width of her  chest over incisions to help flatten incisions and decrease swelling and placed in large TG soft. Assisted pt with placement and fastening bra and patient reported that it felt much better with the foam. Advised to remove if she experiences any discomfort. Discussed resuming 4 post op exercises for ROM     12/10/2022 Soft Tissue mobilization to bilateral UT, pectorals and Lats with cocoa butter in supine PROM bilateral shoulder flexion, scaption, abd, IR and ER with multiple VC's to relax Supine AROM bilateral shoulder flexion,scaption, horizontal abd in comfortable ROM x 5 ea  12/08/2022 Soft Tissue mobilization to bilateral UT, pectorals and Lats with cocoa butter in supine AAROM clasped hands flexion and stargazer x 5, wall slides x 5 PROM bilateral shoulder flexion, scaption, abd, IR and ER with multiple VC's to relax Tried supine wand x 2 but pt had trouble relaxing. Lower trunk rotation to the left x 4 to stretch lats/pecs gently   PATIENT EDUCATION:   Access Code: [email or web address] URL: [email or web address] Date: 02/26/2023 Prepared by: Cheral Almas  Exercises - Supine Lower Trunk Rotation  - 2 x daily - 7 x weekly - 1 sets - 3-4 reps - 20 hold - Corner Pec Major Stretch  - 2 x daily - 7 x weekly - 1 sets - 3-4 reps - 20 hold - Single Arm Doorway Pec Stretch at 90 Degrees Abduction  - 2 x daily - 7 x weekly - 1 sets - 3-4 reps - 20 hold - TL Sidebending Stretch - Single Arm Overhead  - 2 x daily - 7 x weekly - 1 sets - 3-4 reps Education details: Reviewed 4 post op exercises. Did better with abduction sliding hand on wall Person educated: Patient Education method: Explanation and Demonstration Education comprehension: returned demonstration    HOME EXERCISE PROGRAM: Reviewed previously given post op HEP. Changed wall walk to wall slide and pt demonstrated good improvement Supine scapular series except sword x 10 with yellow ASSESSMENT:  CLINICAL  IMPRESSION: Tightness with driving is improving. Less tightness overall noted with STM. Marland Kitchen  Now mainly in pectorals. Pt used good form with theraband exercises  Pt will benefit from skilled therapeutic intervention to improve on the following deficits: Decreased knowledge of precautions, impaired UE functional use, pain, decreased ROM, postural dysfunction., edema   PT treatment/interventions: ADL/Self care home management, Therapeutic exercises, Therapeutic activity, Neuromuscular re-education, Patient/Family education, Self Care, Joint mobilization, Orthotic/Fit training, Dry Needling, Manual lymph drainage, scar mobilization, Manual therapy, and Re-evaluation   GOALS: Goals reviewed with patient? Yes  LONG TERM GOALS:  (STG=LTG)  GOALS Name Target Date  Goal status  1 Pt will demonstrate she has regained full shoulder ROM and function post operatively compared to baselines.  Baseline: 01/13/2023 03/31/2023 In progress  2 Pts quick dash will be improved to pre-surgery level of 36% 03/31/2023 In progress  3 Pt will be able to dress and bathe and perform light household chores independently 01/13/2023 MET 12/29/2022  4 Pt  will have decreased pain by 50% overall 01/13/2023 03/31/2023 MET 12/29/2022 Not met since new surgery IN PROGRESS  5. Pt will resume usual home and work activities with minimal complaint 03/31/2023      PLAN:  PT FREQUENCY/DURATION: 2x/week x 6 weeks  PLAN FOR NEXT SESSION: Check swelling at chest, pulleys,STM bilateral UQ, AAROM, PROM bilateral shoulders,  ABC strength handout,MLD prn especially to left  chest axillary region,   [facility]  [address]  [place] [address]  [phone]  After Breast Cancer Class It is recommended you attend the ABC class to be educated on lymphedema risk reduction. This class is free of charge and lasts for 1 hour. It is a 1-time class. You will need to download the Webex app either on your phone or  computer. We will send you a link the night before or the morning of the class. You should be able to click on that link to join the class. This is not a confidential class. You don't have to turn your camera on, but other participants may be able to see your email address.  Scar massage You can begin gentle scar massage to you incision sites. Gently place one hand on the incision and move the skin (without sliding on the skin) in various directions. Do this for a few minutes and then you can gently massage either coconut oil or vitamin E cream into the scars.  Compression garment You should continue wearing your compression bra until you feel like you no longer have swelling.  Home exercise Program Continue doing the exercises you were given until you feel like you can do them without feeling any tightness at the end.   Walking Program Studies show that 30 minutes of walking per day (fast enough to elevate your heart rate) can significantly reduce the risk of a cancer recurrence. If you can't walk due to other medical reasons, we encourage you to find another activity you could do (like a stationary bike or water exercise).  Posture After breast cancer surgery, people frequently sit with rounded shoulders posture because it puts their incisions on slack and feels better. If you sit like this and scar tissue forms in that position, you can become very tight and have pain sitting or standing with good posture. Try to be aware of your posture and sit and stand up tall to heal properly.  Follow up PT: It is recommended you return every 3 months for the first 3 years following surgery to be assessed on the SOZO machine for an L-Dex score. This helps prevent clinically significant lymphedema in 95% of patients. These follow up screens are 10 minute appointments that you are not billed for.  Claris Pong, PT 03/04/2023, 11:53 AM

## 2023-03-09 ENCOUNTER — Ambulatory Visit: Payer: 59

## 2023-03-09 DIAGNOSIS — R293 Abnormal posture: Secondary | ICD-10-CM

## 2023-03-09 DIAGNOSIS — M25612 Stiffness of left shoulder, not elsewhere classified: Secondary | ICD-10-CM

## 2023-03-09 DIAGNOSIS — C50411 Malignant neoplasm of upper-outer quadrant of right female breast: Secondary | ICD-10-CM

## 2023-03-09 DIAGNOSIS — R222 Localized swelling, mass and lump, trunk: Secondary | ICD-10-CM

## 2023-03-09 DIAGNOSIS — M25611 Stiffness of right shoulder, not elsewhere classified: Secondary | ICD-10-CM

## 2023-03-09 NOTE — Therapy (Signed)
OUTPATIENT PHYSICAL THERAPY BREAST CANCER TREATMENT   Patient Name: Elizabeth Benton MRN: [id number] DOB:09-26-1946, 77 y.o., female Today's Date: 03/09/2023  END OF SESSION:  PT End of Session - 03/09/23 0959     Visit Number 15    Number of Visits 21    Date for PT Re-Evaluation 03/31/23    PT Start Time 1001    PT Stop Time 1056    PT Time Calculation (min) 55 min    Activity Tolerance Patient tolerated treatment well    Behavior During Therapy [facility] for tasks assessed/performed              Past Medical History:  Diagnosis Date   Arthritis    Breast cancer ([place])    Depression    H/O migraine    HSV-2 (herpes simplex virus 2) infection    Hyperlipidemia    Hypertension    Lichen sclerosus    Migraine    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   Vaginal atrophy 12/29/2007   Vaginitis and vulvovaginitis 12/28/2004   Past Surgical History:  Procedure Laterality Date   ABDOMINAL HYSTERECTOMY  1987   BREAST RECONSTRUCTION WITH PLACEMENT OF TISSUE EXPANDER AND FLEX HD (ACELLULAR HYDRATED DERMIS) Bilateral 10/20/2022   Procedure: BREAST RECONSTRUCTION WITH PLACEMENT OF TISSUE EXPANDER AND FLEX HD (ACELLULAR HYDRATED DERMIS);  Surgeon: Lennice Sites, MD;  Location: [place];  Service: Plastics;  Laterality: Bilateral;   DILATION AND CURETTAGE OF UTERUS     MASTECTOMY W/ SENTINEL NODE BIOPSY Right 10/20/2022   Procedure: RIGHT MASTECTOMY WITH RIGHT SENTINEL LYMPH NODE BIOPSY;  Surgeon: Stark Klein, MD;  Location: [place];  Service: General;  Laterality: Right;   REMOVAL OF BILATERAL TISSUE EXPANDERS WITH PLACEMENT OF BILATERAL BREAST IMPLANTS Bilateral 01/08/2023   Procedure: Removal of bilateral tissue expanders;  Surgeon: [name], MD;  Location: [place];  Service: Plastics;  Laterality: Bilateral;   TONSILLECTOMY     TOTAL MASTECTOMY Left 10/20/2022   Procedure: LEFT TOTAL MASTECTOMY;  Surgeon: Stark Klein, MD;  Location: [place];  Service: General;   Laterality: Left;   TUBAL LIGATION     WISDOM TOOTH EXTRACTION     Patient Active Problem List   Diagnosis Date Noted   S/P breast reconstruction 10/20/2022   Genetic testing 08/24/2022   Family history of breast cancer 08/06/2022   Family history of prostate cancer 08/06/2022   Malignant neoplasm of upper-outer quadrant of right breast in female, estrogen receptor positive ([place]) 07/31/2022   Abnormal glucose level 12/05/2020   Constipation 12/05/2020   Lumbago with sciatica 12/05/2020   Osteopenia 12/05/2020   Overweight 12/05/2020   Screening for malignant neoplasm of colon 12/05/2020   Vitamin D deficiency 12/05/2020   Essential hypertension 11/30/2019   LBBB (left bundle branch block) 11/30/2019   Exertional dyspnea 99991111   Lichen sclerosus 99991111   Mixed hyperlipidemia    HSV-2 (herpes simplex virus 2) infection    Depression    H/O migraine    Monilia infection    Vaginal atrophy    Wears glasses 06/23/2011   Sinus problem/runny nose 06/23/2011   Mass on back 06/23/2011    PCP: Willey Blade, MD  REFERRING PROVIDER: Nicholas Lose, MD  REFERRING DIAG: Right Breast Cancer  THERAPY DIAG:  Malignant neoplasm of upper-outer quadrant of right breast in female, estrogen receptor positive ([place])  Abnormal posture  Stiffness of left shoulder, not elsewhere classified  Stiffness of right shoulder, not elsewhere classified  Localized swelling of chest  wall  Rationale for Evaluation and Treatment: Rehabilitation  ONSET DATE: 07/24/2022  SUBJECTIVE:                                                                                                                                                                                           SUBJECTIVE STATEMENT I am better today. I had one day where I was really sore, tight and heavy. It doesn't look swollen, but it doesn't feel good. After I massage them it feels better.  PERTINENT HISTORY:  Patient was  diagnosed on 07/24/2022 with right grade 2 Invasive Lobular Carcinoma. It measures 1.2 cm and is located in the upper-outer quadrant. It is ER 95%, PR 80% and HER 2 negative with a Ki67 of 40%. She is s/p a Bilateral Mastectomy on 10/20/2022  for Right Breast Cancer and with left prophylactic and bilateral tissue expanders .  She has since had the expanders removed  and bilateral subtotal capsulotomies on Jan. 12,2024. She had 90 cc  of fluid aspirated on 01/28/2023,and 50 cc of  fluid aspirated 02/08/2023 from the left breast area  PATIENT GOALS:  Reassess how my recovery is going related to arm function, pain, and swelling since expander removal and capsulectomies  PAIN:  Are you having pain?  PAIN:  Are you having pain? Yes mostly soreness NPRS scale: 0/10 discomfort bilateral trunk region under armpits, and lower rib cage. Pain location: both lateral trunk,  under arm pits Pain orientation: Right and left PAIN TYPE: tight and bruising sort of, heavy at end of day Pain description: constant  Aggravating factors: Relieving factors: compression, MLD, tumeric  PRECAUTIONS: Recent Surgery, right UE Lymphedema risk, DDD, LBP  ACTIVITY LEVEL / LEISURE: ,  not returned to work, goes back March 4   OBJECTIVE:   PATIENT SURVEYS:  QUICK DASH: Initial eval 61.36,  Re-eval  39%  OBSERVATIONS: Pt with larger right breast  than secondary to expander filled more. Mastectomy incisions are healed with glue still present. Left axillary region with area of swelling/tissue greater than right. Pt will be having an [place] today on the left. Having expanders removed in early January.  02/17/2023 Ballotable fluid noted left chest region, Right incision area irregular and folding inward. Left incision healed but also irregular without folding noted  POSTURE:  Forward head, rounded shoulders  LYMPHEDEMA ASSESSMENT:     UPPER EXTREMITY AROM/PROM:   A/PROM RIGHT   eval   Right 12/02/2022 RIGHT 12/29/2022  RIGHT 01/05/2023 Right Re-eval RIGHT 03/02/2023  Shoulder extension 60 49 63   57 tight axilla 60  Flexion      128 axilla 143  Shoulder abduction 168 68 145 149 tight axilla 130 143  Shoulder internal rotation 55    60 70  Shoulder external rotation 112    98 109                          (Blank rows = not tested)   A/PROM LEFT   eval LEFT 2023 LEFT 12/29/2022 LEFT Re-eval LEFT 03/02/2023  Shoulder extension 58 46 65 52 tight 60  Shoulder flexion 159 120 160 155 tight chest 160  Shoulder abduction 165 90 156 123 tight axilla 158  Shoulder internal rotation 65   55 65  Shoulder external rotation 112   88 107                          (Blank rows = not tested)     CERVICAL AROM: All within functional  limits: Decreased 20 % Rotation and 50% SB         UPPER EXTREMITY STRENGTH: WNL     LYMPHEDEMA ASSESSMENTS:    LANDMARK RIGHT   eval Right 12/02/2022 RIGHT 02/17/2023  10 cm proximal to olecranon process 28.5 28.7 28.6  Olecranon process 24.6 24.6 24.6  10 cm proximal to ulnar styloid process 21.8 21.7 21.4  Just proximal to ulnar styloid process 16.4 16.3 16.4  Across hand at thumb web space 18.3 18.2 18.4  At base of 2nd digit 6.5 6.6 6.4  (Blank rows = not tested)   LANDMARK LEFT   eval LEFT 12/02/2022  10 cm proximal  29.3 29.7  Olecranon process  24.9 24.7  10 cm proximal to ulnar styloid process 21.0 21.4  Just proximal to ulnar styloid process 16.3 16.3  Across hand at thumb web space 18.0 17.9  At base of 2nd digit 6.45 6.4  (Blank rows = not tested)      Surgery type/Date: 10/20/2022 bilateral Mastectomy with right SLNB, 01/08/2023 Removal of bilateral tissue expanders and bilateral capsulectomies Number of lymph nodes removed: 0/2 right Current/past treatment (chemo, radiation, hormone therapy): No chemo required, will start anti-estrogen therapy Other symptoms:  Heaviness/tightness Yes Pain Yes Pitting edema No Infections No Decreased scar mobility  Yes Stemmer sign No  TREATMENT TODAY   03/09/2023  Pulleys x 1:30 flex, scaption, Abd Standing jobes flexion  and scaption, abd x 10 STM bilateral UT, pectorals, lats with cocoa butter PROM bilateral shoulder flexion, scaption, abduction, ER with intermittent MFR at axilla for cording MFR bilateral axillary and upper arm cording with arm in D2 flexion  Supine scapular series x 10 with yellow except sword x 5  03/04/2023 Pulleys x 1:30 flex, scaption, Abd, Ball rolls flexion x 10, bilateral abd x 5 STM bilateral UT, pectorals, lats with cocoa butter PROM bilateral shoulder flexion, scaption, abduction, ER with intermittent MFR at axilla for cording MFR bilateral chest region bilaterally Supine scapular series x 10 including sword x 5 with yellow band   03/02/2023 STM bilateral UT, pectorals, lats with cocoa butter PROM bilateral shoulder flexion, scaption, abduction, ER with intermittent MFR at axilla for cording MFR bilateral chest region bilaterally Supine scapular series x 10 except sword with yellow band Updated HEP with band exercises Measured ROM 02/26/2023  STM bilateral UT, pectorals, lats with cocoa butter PROM bilateral shoulder flexion, scaption, abduction, ER with intermittent MFR at axilla for cording MFR bilateral chest region AROM supine bilateral Flexion, scaption, horizontal abduction x 5 Corner pec stretch and  doorway pecs stretch x 3-4 ea, SB stretch x 2 ea Updated HEP with stretches  02/24/2023  Overhead pulleys x 1:30 flexion, scaption, abduction, ball rolls on wall x 5 flexion x 5 bilateral abduction STM bilateral UT, pectorals, lats with cocoa butter Supine wand flexion and scaption x 5  PROM bilateral shoulder flexion, scaption, abduction, ER with intermittent MFR at axilla MFR bilateral chest region Lower trunk rotation x 5 ea direction with arms outstretched   02/19/2023  Overhead pulleys x 1:30 flexion, scaption, abduction, ball rolls on wall x 8  flexion x 5 bilateral abduction Supine wand flexion and scaption 5 x 5 sec  PROM bilateral shoulder flexion, scaption, abduction, ER with intermittent MFR at axilla Lower trunk rotation x 5 ea direction with arms outstretched MLD To short neck, left axillary and inguinal LN's and left axillo-inguinal pathway to decrease swelling, Repeated on right side to decrease edema at axillary region/trunk  02/17/2023 Large piece of foam cut to cover the full width of her chest over incisions to help flatten incisions and decrease swelling and placed in large TG soft. Assisted pt with placement and fastening bra and patient reported that it felt much better with the foam. Advised to remove if she experiences any discomfort. Discussed resuming 4 post op exercises for ROM     12/10/2022 Soft Tissue mobilization to bilateral UT, pectorals and Lats with cocoa butter in supine PROM bilateral shoulder flexion, scaption, abd, IR and ER with multiple VC's to relax Supine AROM bilateral shoulder flexion,scaption, horizontal abd in comfortable ROM x 5 ea  12/08/2022 Soft Tissue mobilization to bilateral UT, pectorals and Lats with cocoa butter in supine AAROM clasped hands flexion and stargazer x 5, wall slides x 5 PROM bilateral shoulder flexion, scaption, abd, IR and ER with multiple VC's to relax Tried supine wand x 2 but pt had trouble relaxing. Lower trunk rotation to the left x 4 to stretch lats/pecs gently   PATIENT EDUCATION:   Access Code: [email or web address] URL: [email or web address] Date: 02/26/2023 Prepared by: Cheral Almas  Exercises - Supine Lower Trunk Rotation  - 2 x daily - 7 x weekly - 1 sets - 3-4 reps - 20 hold - Corner Pec Major Stretch  - 2 x daily - 7 x weekly - 1 sets - 3-4 reps - 20 hold - Single Arm Doorway Pec Stretch at 90 Degrees Abduction  - 2 x daily - 7 x weekly - 1 sets - 3-4 reps - 20 hold - TL Sidebending Stretch - Single Arm Overhead  - 2 x daily - 7 x weekly - 1  sets - 3-4 reps Education details: Reviewed 4 post op exercises. Did better with abduction sliding hand on wall Person educated: Patient Education method: Explanation and Demonstration Education comprehension: returned demonstration    HOME EXERCISE PROGRAM: Reviewed previously given post op HEP. Changed wall walk to wall slide and pt demonstrated good improvement Supine scapular series except sword x 10 with yellow ASSESSMENT:  CLINICAL IMPRESSION: Pt used good form with Jobes exercise but was limited with left UE abd to about 90 degrees due to left chest discomfort. Tightness noted bilateral pectorals. Cording still present bilaterally but not limiting ROM too much. ROM doing very well after manual and PROM  Pt will benefit from skilled therapeutic intervention to improve on the following deficits: Decreased knowledge of precautions, impaired UE functional use, pain, decreased ROM, postural dysfunction., edema   PT treatment/interventions: ADL/Self care home management, Therapeutic exercises, Therapeutic activity, Neuromuscular re-education,  Patient/Family education, Self Care, Joint mobilization, Orthotic/Fit training, Dry Needling, Manual lymph drainage, scar mobilization, Manual therapy, and Re-evaluation   GOALS: Goals reviewed with patient? Yes  LONG TERM GOALS:  (STG=LTG)  GOALS Name Target Date  Goal status  1 Pt will demonstrate she has regained full shoulder ROM and function post operatively compared to baselines.  Baseline: 01/13/2023 03/31/2023 In progress  2 Pts quick dash will be improved to pre-surgery level of 36% 03/31/2023 In progress  3 Pt will be able to dress and bathe and perform light household chores independently 01/13/2023 MET 12/29/2022  4 Pt will have decreased pain by 50% overall 01/13/2023 03/31/2023 MET 12/29/2022 Not met since new surgery IN PROGRESS  5. Pt will resume usual home and work activities with minimal complaint 03/31/2023      PLAN:  PT  FREQUENCY/DURATION: 2x/week x 6 weeks  PLAN FOR NEXT SESSION: Check swelling at chest, pulleys,STM bilateral UQ, AAROM, PROM bilateral shoulders,  ABC strength handout,MLD prn especially to left  chest axillary region,   [facility]  [address]  [place] [address]  [phone]  After Breast Cancer Class It is recommended you attend the ABC class to be educated on lymphedema risk reduction. This class is free of charge and lasts for 1 hour. It is a 1-time class. You will need to download the Webex app either on your phone or computer. We will send you a link the night before or the morning of the class. You should be able to click on that link to join the class. This is not a confidential class. You don't have to turn your camera on, but other participants may be able to see your email address.  Scar massage You can begin gentle scar massage to you incision sites. Gently place one hand on the incision and move the skin (without sliding on the skin) in various directions. Do this for a few minutes and then you can gently massage either coconut oil or vitamin E cream into the scars.  Compression garment You should continue wearing your compression bra until you feel like you no longer have swelling.  Home exercise Program Continue doing the exercises you were given until you feel like you can do them without feeling any tightness at the end.   Walking Program Studies show that 30 minutes of walking per day (fast enough to elevate your heart rate) can significantly reduce the risk of a cancer recurrence. If you can't walk due to other medical reasons, we encourage you to find another activity you could do (like a stationary bike or water exercise).  Posture After breast cancer surgery, people frequently sit with rounded shoulders posture because it puts their incisions on slack and feels better. If you sit like this and scar tissue forms in that position,  you can become very tight and have pain sitting or standing with good posture. Try to be aware of your posture and sit and stand up tall to heal properly.  Follow up PT: It is recommended you return every 3 months for the first 3 years following surgery to be assessed on the SOZO machine for an L-Dex score. This helps prevent clinically significant lymphedema in 95% of patients. These follow up screens are 10 minute appointments that you are not billed for.  Claris Pong, PT 03/09/2023, 10:58 AM

## 2023-03-11 ENCOUNTER — Ambulatory Visit: Payer: 59

## 2023-03-11 DIAGNOSIS — M25611 Stiffness of right shoulder, not elsewhere classified: Secondary | ICD-10-CM

## 2023-03-11 DIAGNOSIS — R293 Abnormal posture: Secondary | ICD-10-CM

## 2023-03-11 DIAGNOSIS — M25612 Stiffness of left shoulder, not elsewhere classified: Secondary | ICD-10-CM

## 2023-03-11 DIAGNOSIS — C50411 Malignant neoplasm of upper-outer quadrant of right female breast: Secondary | ICD-10-CM

## 2023-03-11 DIAGNOSIS — R222 Localized swelling, mass and lump, trunk: Secondary | ICD-10-CM

## 2023-03-11 NOTE — Therapy (Signed)
OUTPATIENT PHYSICAL THERAPY BREAST CANCER TREATMENT   Patient Name: Elizabeth Benton MRN: [id number] DOB:1946/11/21, 77 y.o., female Today's Date: 03/11/2023  END OF SESSION:  PT End of Session - 03/11/23 0959     Visit Number 16    Number of Visits 21    Date for PT Re-Evaluation 03/31/23    PT Start Time 1000    PT Stop Time 1048    PT Time Calculation (min) 48 min    Activity Tolerance Patient tolerated treatment well    Behavior During Therapy [facility] for tasks assessed/performed              Past Medical History:  Diagnosis Date   Arthritis    Breast cancer ([place])    Depression    H/O migraine    HSV-2 (herpes simplex virus 2) infection    Hyperlipidemia    Hypertension    Lichen sclerosus    Migraine    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   Vaginal atrophy 12/29/2007   Vaginitis and vulvovaginitis 12/28/2004   Past Surgical History:  Procedure Laterality Date   ABDOMINAL HYSTERECTOMY  1987   BREAST RECONSTRUCTION WITH PLACEMENT OF TISSUE EXPANDER AND FLEX HD (ACELLULAR HYDRATED DERMIS) Bilateral 10/20/2022   Procedure: BREAST RECONSTRUCTION WITH PLACEMENT OF TISSUE EXPANDER AND FLEX HD (ACELLULAR HYDRATED DERMIS);  Surgeon: Lennice Sites, MD;  Location: [place];  Service: Plastics;  Laterality: Bilateral;   DILATION AND CURETTAGE OF UTERUS     MASTECTOMY W/ SENTINEL NODE BIOPSY Right 10/20/2022   Procedure: RIGHT MASTECTOMY WITH RIGHT SENTINEL LYMPH NODE BIOPSY;  Surgeon: Stark Klein, MD;  Location: [place];  Service: General;  Laterality: Right;   REMOVAL OF BILATERAL TISSUE EXPANDERS WITH PLACEMENT OF BILATERAL BREAST IMPLANTS Bilateral 01/08/2023   Procedure: Removal of bilateral tissue expanders;  Surgeon: [name], MD;  Location: [place];  Service: Plastics;  Laterality: Bilateral;   TONSILLECTOMY     TOTAL MASTECTOMY Left 10/20/2022   Procedure: LEFT TOTAL MASTECTOMY;  Surgeon: Stark Klein, MD;  Location: [place];  Service: General;   Laterality: Left;   TUBAL LIGATION     WISDOM TOOTH EXTRACTION     Patient Active Problem List   Diagnosis Date Noted   S/P breast reconstruction 10/20/2022   Genetic testing 08/24/2022   Family history of breast cancer 08/06/2022   Family history of prostate cancer 08/06/2022   Malignant neoplasm of upper-outer quadrant of right breast in female, estrogen receptor positive ([place]) 07/31/2022   Abnormal glucose level 12/05/2020   Constipation 12/05/2020   Lumbago with sciatica 12/05/2020   Osteopenia 12/05/2020   Overweight 12/05/2020   Screening for malignant neoplasm of colon 12/05/2020   Vitamin D deficiency 12/05/2020   Essential hypertension 11/30/2019   LBBB (left bundle branch block) 11/30/2019   Exertional dyspnea 99991111   Lichen sclerosus 99991111   Mixed hyperlipidemia    HSV-2 (herpes simplex virus 2) infection    Depression    H/O migraine    Monilia infection    Vaginal atrophy    Wears glasses 06/23/2011   Sinus problem/runny nose 06/23/2011   Mass on back 06/23/2011    PCP: Willey Blade, MD  REFERRING PROVIDER: Nicholas Lose, MD  REFERRING DIAG: Right Breast Cancer  THERAPY DIAG:  Malignant neoplasm of upper-outer quadrant of right breast in female, estrogen receptor positive ([place])  Abnormal posture  Stiffness of left shoulder, not elsewhere classified  Stiffness of right shoulder, not elsewhere classified  Localized swelling of chest  wall  Rationale for Evaluation and Treatment: Rehabilitation  ONSET DATE: 07/24/2022  SUBJECTIVE:                                                                                                                                                                                           SUBJECTIVE STATEMENT I am doing better overall than Monday. The right arm is doing better than the left. I have swiffered the floors and done some vacuuming. I can put up dishes with both hands  PERTINENT HISTORY:  Patient was  diagnosed on 07/24/2022 with right grade 2 Invasive Lobular Carcinoma. It measures 1.2 cm and is located in the upper-outer quadrant. It is ER 95%, PR 80% and HER 2 negative with a Ki67 of 40%. She is s/p a Bilateral Mastectomy on 10/20/2022  for Right Breast Cancer and with left prophylactic and bilateral tissue expanders .  She has since had the expanders removed  and bilateral subtotal capsulotomies on Jan. 12,2024. She had 90 cc  of fluid aspirated on 01/28/2023,and 50 cc of  fluid aspirated 02/08/2023 from the left breast area  PATIENT GOALS:  Reassess how my recovery is going related to arm function, pain, and swelling since expander removal and capsulectomies  PAIN:  Are you having pain?  PAIN:  Are you having pain? Yes mostly soreness NPRS scale: 0/10 discomfort bilateral trunk region under armpits, and lower rib cage. Pain location: both lateral trunk,  under arm pits Pain orientation: Right and left PAIN TYPE: tight and bruising sort of, heavy at end of day Pain description: constant  Aggravating factors: Relieving factors: compression, MLD, tumeric  PRECAUTIONS: Recent Surgery, right UE Lymphedema risk, DDD, LBP  ACTIVITY LEVEL / LEISURE: ,  not returned to work, goes back March 4   OBJECTIVE:   PATIENT SURVEYS:  QUICK DASH: Initial eval 61.36,  Re-eval  39%  OBSERVATIONS: Pt with larger right breast  than secondary to expander filled more. Mastectomy incisions are healed with glue still present. Left axillary region with area of swelling/tissue greater than right. Pt will be having an [place] today on the left. Having expanders removed in early January.  02/17/2023 Ballotable fluid noted left chest region, Right incision area irregular and folding inward. Left incision healed but also irregular without folding noted  POSTURE:  Forward head, rounded shoulders  LYMPHEDEMA ASSESSMENT:     UPPER EXTREMITY AROM/PROM:   A/PROM RIGHT   eval   Right 12/02/2022 RIGHT 12/29/2022  RIGHT 01/05/2023 Right Re-eval RIGHT 03/02/2023  Shoulder extension 60 49 63   57 tight axilla 60  Flexion      128 axilla 143  Shoulder abduction 168 68 145 149 tight axilla 130 143  Shoulder internal rotation 55    60 70  Shoulder external rotation 112    98 109                          (Blank rows = not tested)   A/PROM LEFT   eval LEFT 2023 LEFT 12/29/2022 LEFT Re-eval LEFT 03/02/2023  Shoulder extension 58 46 65 52 tight 60  Shoulder flexion 159 120 160 155 tight chest 160  Shoulder abduction 165 90 156 123 tight axilla 158  Shoulder internal rotation 65   55 65  Shoulder external rotation 112   88 107                          (Blank rows = not tested)     CERVICAL AROM: All within functional  limits: Decreased 20 % Rotation and 50% SB         UPPER EXTREMITY STRENGTH: WNL     LYMPHEDEMA ASSESSMENTS:    LANDMARK RIGHT   eval Right 12/02/2022 RIGHT 02/17/2023  10 cm proximal to olecranon process 28.5 28.7 28.6  Olecranon process 24.6 24.6 24.6  10 cm proximal to ulnar styloid process 21.8 21.7 21.4  Just proximal to ulnar styloid process 16.4 16.3 16.4  Across hand at thumb web space 18.3 18.2 18.4  At base of 2nd digit 6.5 6.6 6.4  (Blank rows = not tested)   LANDMARK LEFT   eval LEFT 12/02/2022  10 cm proximal  29.3 29.7  Olecranon process  24.9 24.7  10 cm proximal to ulnar styloid process 21.0 21.4  Just proximal to ulnar styloid process 16.3 16.3  Across hand at thumb web space 18.0 17.9  At base of 2nd digit 6.45 6.4  (Blank rows = not tested)      Surgery type/Date: 10/20/2022 bilateral Mastectomy with right SLNB, 01/08/2023 Removal of bilateral tissue expanders and bilateral capsulectomies Number of lymph nodes removed: 0/2 right Current/past treatment (chemo, radiation, hormone therapy): No chemo required, will start anti-estrogen therapy Other symptoms:  Heaviness/tightness Yes Pain Yes Pitting edema No Infections No Decreased scar mobility  Yes Stemmer sign No  TREATMENT TODAY   03/11/2023  Pulleys x 1:30 flexion, scaption, abd. To warm up Standing yellow theraband x 10 scapular retraction, extension, bilateral ER STM bilateral UT, pectorals, lats with cocoa butter PROM bilateral shoulder flexion, scaption, abduction, ER with intermittent MFR at axilla for cording Standing counter stretch x 4, standing single arm chest stretch bilaterally   03/09/2023  Pulleys x 1:30 flex, scaption, Abd Standing jobes flexion  and scaption, abd x 10 STM bilateral UT, pectorals, lats with cocoa butter PROM bilateral shoulder flexion, scaption, abduction, ER with intermittent MFR at axilla for cording MFR bilateral axillary and upper arm cording with arm in D2 flexion  Supine scapular series x 10 with yellow except sword x 5  03/04/2023 Pulleys x 1:30 flex, scaption, Abd, Ball rolls flexion x 10, bilateral abd x 5 STM bilateral UT, pectorals, lats with cocoa butter PROM bilateral shoulder flexion, scaption, abduction, ER with intermittent MFR at axilla for cording MFR bilateral chest region bilaterally Supine scapular series x 10 including sword x 5 with yellow band   03/02/2023 STM bilateral UT, pectorals, lats with cocoa butter PROM bilateral shoulder flexion, scaption, abduction, ER with intermittent MFR at axilla for cording MFR bilateral chest region bilaterally Supine scapular  series x 10 except sword with yellow band Updated HEP with band exercises Measured ROM 02/26/2023  STM bilateral UT, pectorals, lats with cocoa butter PROM bilateral shoulder flexion, scaption, abduction, ER with intermittent MFR at axilla for cording MFR bilateral chest region AROM supine bilateral Flexion, scaption, horizontal abduction x 5 Corner pec stretch and doorway pecs stretch x 3-4 ea, SB stretch x 2 ea Updated HEP with stretches  02/24/2023  Overhead pulleys x 1:30 flexion, scaption, abduction, ball rolls on wall x 5 flexion x 5 bilateral  abduction STM bilateral UT, pectorals, lats with cocoa butter Supine wand flexion and scaption x 5  PROM bilateral shoulder flexion, scaption, abduction, ER with intermittent MFR at axilla MFR bilateral chest region Lower trunk rotation x 5 ea direction with arms outstretched   02/19/2023  Overhead pulleys x 1:30 flexion, scaption, abduction, ball rolls on wall x 8 flexion x 5 bilateral abduction Supine wand flexion and scaption 5 x 5 sec  PROM bilateral shoulder flexion, scaption, abduction, ER with intermittent MFR at axilla Lower trunk rotation x 5 ea direction with arms outstretched MLD To short neck, left axillary and inguinal LN's and left axillo-inguinal pathway to decrease swelling, Repeated on right side to decrease edema at axillary region/trunk  02/17/2023 Large piece of foam cut to cover the full width of her chest over incisions to help flatten incisions and decrease swelling and placed in large TG soft. Assisted pt with placement and fastening bra and patient reported that it felt much better with the foam. Advised to remove if she experiences any discomfort. Discussed resuming 4 post op exercises for ROM     12/10/2022 Soft Tissue mobilization to bilateral UT, pectorals and Lats with cocoa butter in supine PROM bilateral shoulder flexion, scaption, abd, IR and ER with multiple VC's to relax Supine AROM bilateral shoulder flexion,scaption, horizontal abd in comfortable ROM x 5 ea  12/08/2022 Soft Tissue mobilization to bilateral UT, pectorals and Lats with cocoa butter in supine AAROM clasped hands flexion and stargazer x 5, wall slides x 5 PROM bilateral shoulder flexion, scaption, abd, IR and ER with multiple VC's to relax Tried supine wand x 2 but pt had trouble relaxing. Lower trunk rotation to the left x 4 to stretch lats/pecs gently   PATIENT EDUCATION:   Access Code: [email or web address] URL: [email or web address] Date: 02/26/2023 Prepared by: Cheral Almas  Exercises - Supine Lower Trunk Rotation  - 2 x daily - 7 x weekly - 1 sets - 3-4 reps - 20 hold - Corner Pec Major Stretch  - 2 x daily - 7 x weekly - 1 sets - 3-4 reps - 20 hold - Single Arm Doorway Pec Stretch at 90 Degrees Abduction  - 2 x daily - 7 x weekly - 1 sets - 3-4 reps - 20 hold - TL Sidebending Stretch - Single Arm Overhead  - 2 x daily - 7 x weekly - 1 sets - 3-4 reps Education details: Reviewed 4 post op exercises. Did better with abduction sliding hand on wall Person educated: Patient Education method: Explanation and Demonstration Education comprehension: returned demonstration    HOME EXERCISE PROGRAM: Reviewed previously given post op HEP. Changed wall walk to wall slide and pt demonstrated good improvement Supine scapular series except sword x 10 with yellow ASSESSMENT:  CLINICAL IMPRESSION:  Continued tightness noted bilateral pectorals, axillary and [place] border and lateral trunk greatest on the left. AROM for abduction on the left improved today with less discomfort. Pt improving overall  and doing much more at home.   Pt will benefit from skilled therapeutic intervention to improve on the following deficits: Decreased knowledge of precautions, impaired UE functional use, pain, decreased ROM, postural dysfunction., edema   PT treatment/interventions: ADL/Self care home management, Therapeutic exercises, Therapeutic activity, Neuromuscular re-education, Patient/Family education, Self Care, Joint mobilization, Orthotic/Fit training, Dry Needling, Manual lymph drainage, scar mobilization, Manual therapy, and Re-evaluation   GOALS: Goals reviewed with patient? Yes  LONG TERM GOALS:  (STG=LTG)  GOALS Name Target Date  Goal status  1 Pt will demonstrate she has regained full shoulder ROM and function post operatively compared to baselines.  Baseline: 01/13/2023 03/31/2023 In progress  2 Pts quick dash will be improved to pre-surgery level of 36% 03/31/2023 In  progress  3 Pt will be able to dress and bathe and perform light household chores independently 01/13/2023 MET 12/29/2022  4 Pt will have decreased pain by 50% overall 01/13/2023 03/31/2023 MET 12/29/2022 Not met since new surgery IN PROGRESS  5. Pt will resume usual home and work activities with minimal complaint 03/31/2023      PLAN:  PT FREQUENCY/DURATION: 2x/week x 6 weeks  PLAN FOR NEXT SESSION: Check swelling at chest, pulleys,STM bilateral UQ, AAROM, PROM bilateral shoulders,  ABC strength handout,MLD prn especially to left  chest axillary region,   [facility]  [address]  [place] [address]  [phone]  After Breast Cancer Class It is recommended you attend the ABC class to be educated on lymphedema risk reduction. This class is free of charge and lasts for 1 hour. It is a 1-time class. You will need to download the Webex app either on your phone or computer. We will send you a link the night before or the morning of the class. You should be able to click on that link to join the class. This is not a confidential class. You don't have to turn your camera on, but other participants may be able to see your email address.  Scar massage You can begin gentle scar massage to you incision sites. Gently place one hand on the incision and move the skin (without sliding on the skin) in various directions. Do this for a few minutes and then you can gently massage either coconut oil or vitamin E cream into the scars.  Compression garment You should continue wearing your compression bra until you feel like you no longer have swelling.  Home exercise Program Continue doing the exercises you were given until you feel like you can do them without feeling any tightness at the end.   Walking Program Studies show that 30 minutes of walking per day (fast enough to elevate your heart rate) can significantly reduce the risk of a cancer recurrence. If you can't  walk due to other medical reasons, we encourage you to find another activity you could do (like a stationary bike or water exercise).  Posture After breast cancer surgery, people frequently sit with rounded shoulders posture because it puts their incisions on slack and feels better. If you sit like this and scar tissue forms in that position, you can become very tight and have pain sitting or standing with good posture. Try to be aware of your posture and sit and stand up tall to heal properly.  Follow up PT: It is recommended you return every 3 months for the first 3 years following surgery to be assessed on the SOZO machine for an L-Dex score. This helps prevent  clinically significant lymphedema in 95% of patients. These follow up screens are 10 minute appointments that you are not billed for.  Claris Pong, PT 03/11/2023, 10:54 AM

## 2023-03-16 ENCOUNTER — Ambulatory Visit: Payer: 59

## 2023-03-16 DIAGNOSIS — M25612 Stiffness of left shoulder, not elsewhere classified: Secondary | ICD-10-CM

## 2023-03-16 DIAGNOSIS — R222 Localized swelling, mass and lump, trunk: Secondary | ICD-10-CM

## 2023-03-16 DIAGNOSIS — R293 Abnormal posture: Secondary | ICD-10-CM

## 2023-03-16 DIAGNOSIS — M25611 Stiffness of right shoulder, not elsewhere classified: Secondary | ICD-10-CM

## 2023-03-16 DIAGNOSIS — C50411 Malignant neoplasm of upper-outer quadrant of right female breast: Secondary | ICD-10-CM | POA: Diagnosis not present

## 2023-03-16 DIAGNOSIS — Z170 Estrogen receptor positive status [ER+]: Secondary | ICD-10-CM

## 2023-03-16 IMAGING — MG MM DIGITAL SCREENING BILAT W/ TOMO AND CAD
8 series · 8 of 24 positions shown · non-contrast
Comparison: Previous exam(s).

CLINICAL DATA: Screening.

EXAM:
DIGITAL SCREENING BILATERAL MAMMOGRAM WITH TOMOSYNTHESIS AND CAD
TECHNIQUE: Bilateral screening digital craniocaudal and mediolateral oblique
mammograms were obtained. Bilateral screening digital breast
tomosynthesis was performed. The images were evaluated with
computer-aided detection.

[R MLO synth-2D]
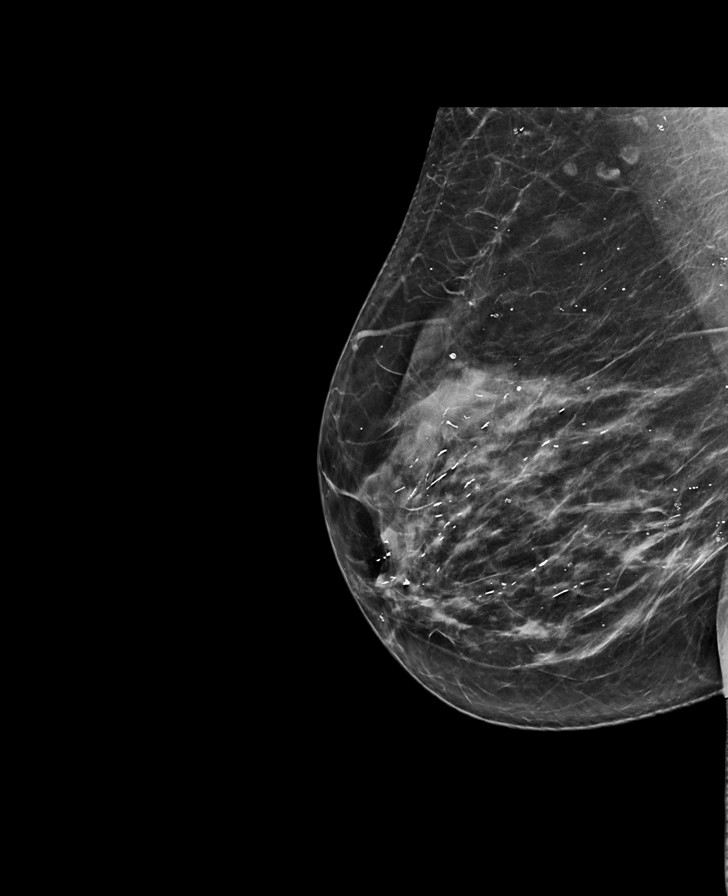

[L CC synth-2D]
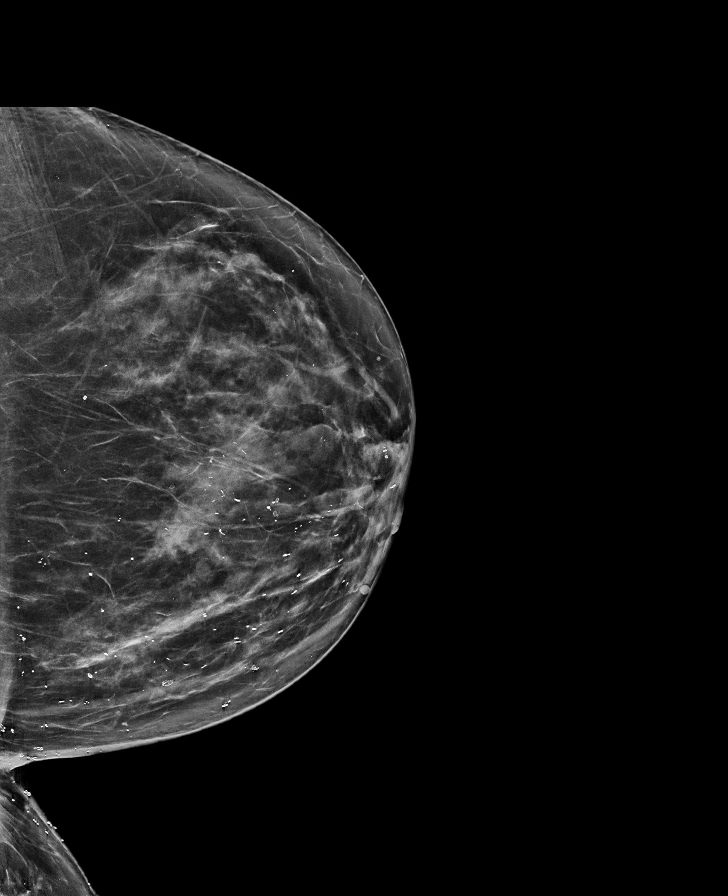

[L MLO synth-2D]
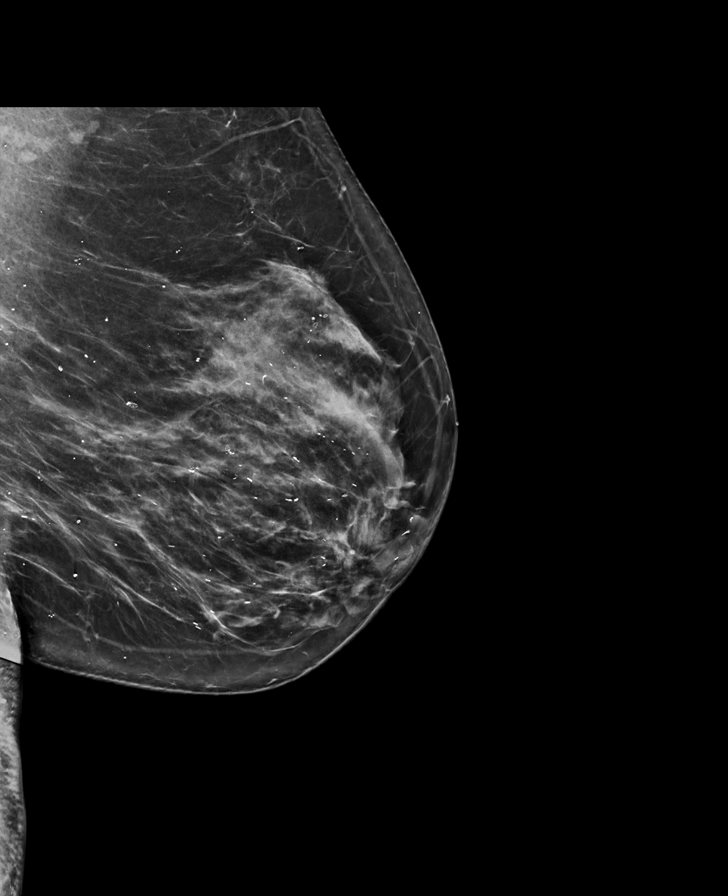

[R CC synth-2D]
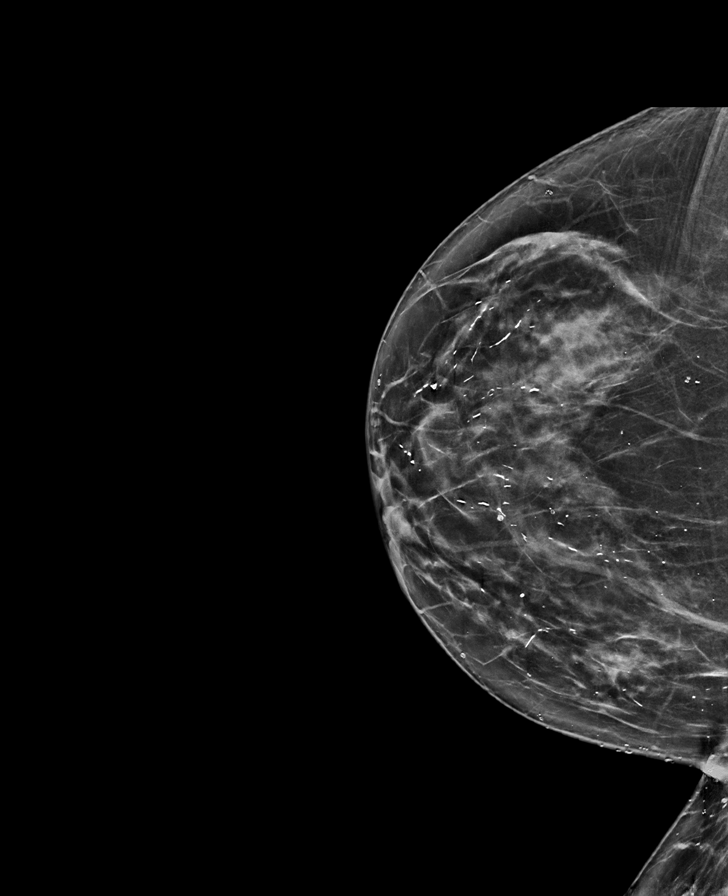

[L MLO tomo · tomo slice 39/76.0]
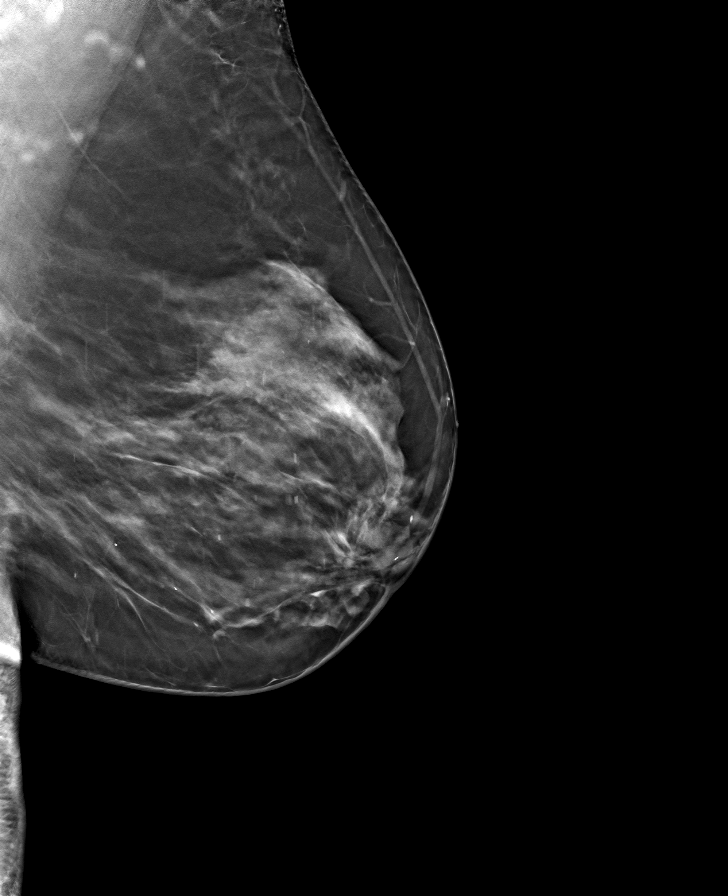

[R MLO tomo · tomo slice 38/75.0]
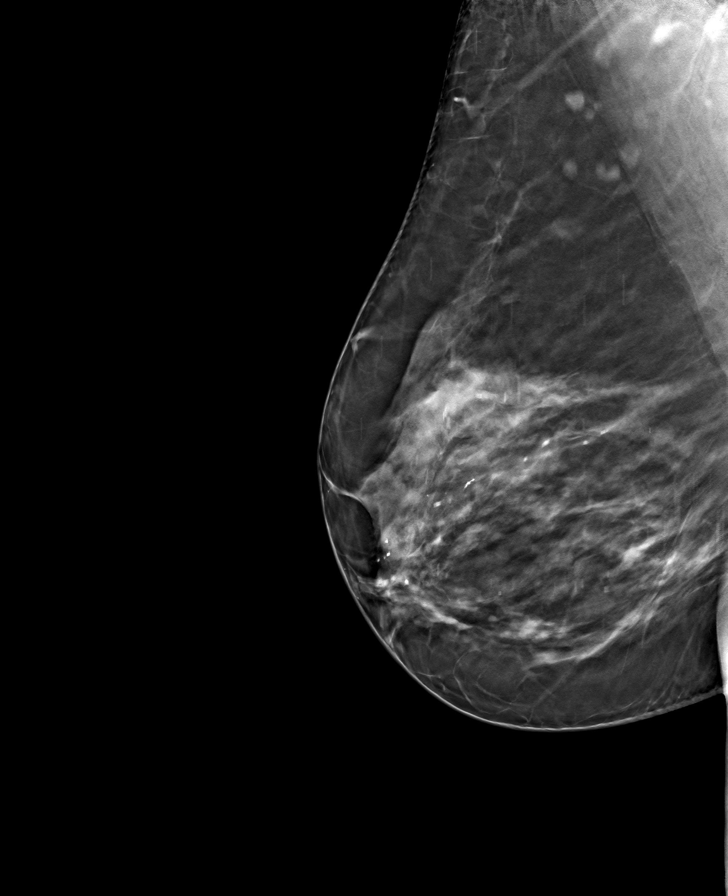

[R CC tomo · tomo slice 35/70.0]
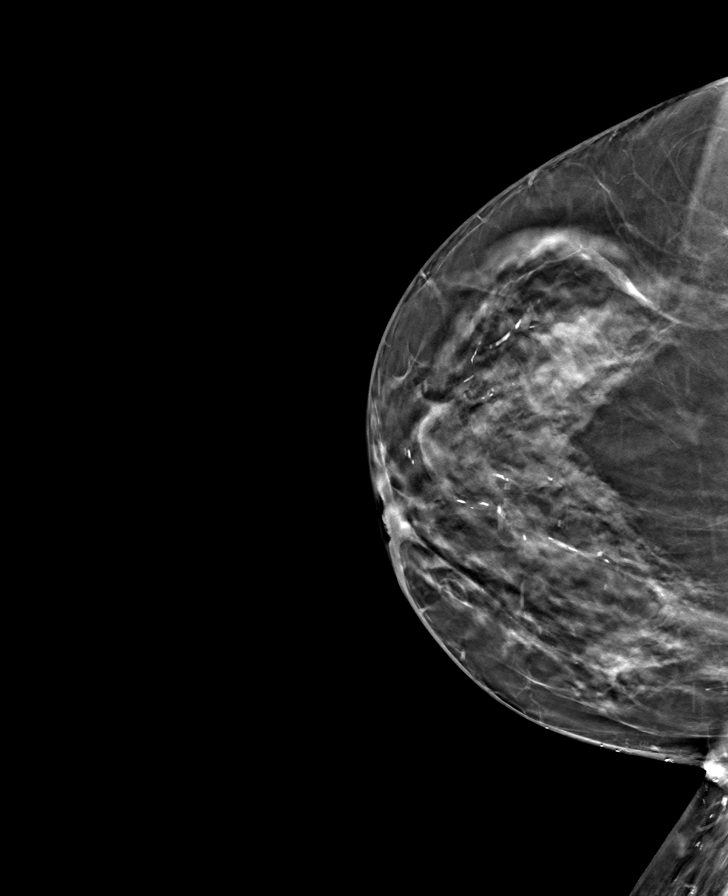

[L CC tomo · tomo slice 37/73.0]
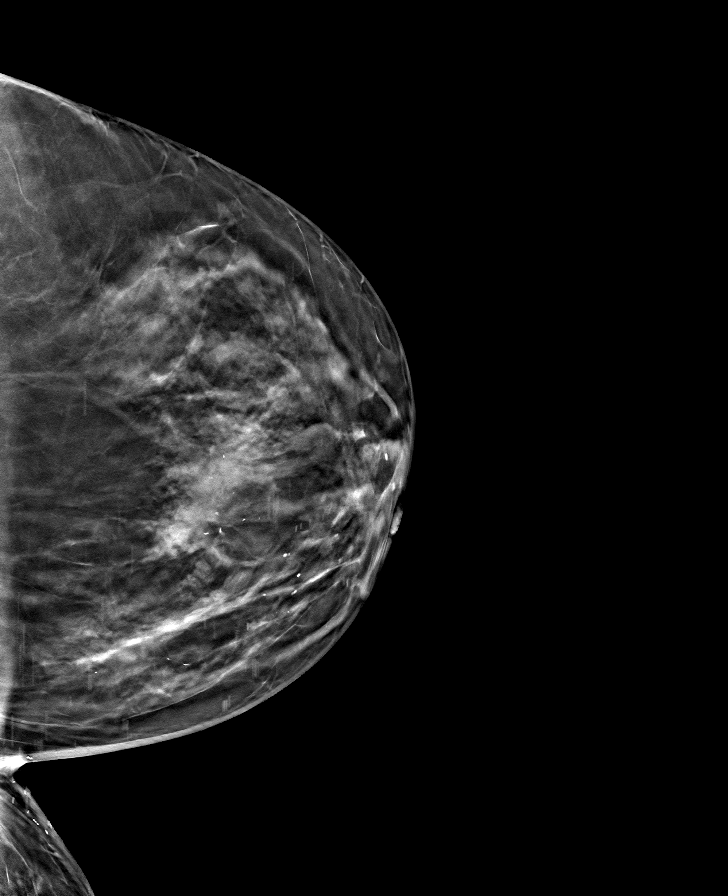

[8 of 24 positions shown; findings below may reference images not displayed]

ACR Breast Density Category c: The breast tissue is heterogeneously
dense, which may obscure small masses.
FINDINGS: There are no findings suspicious for malignancy. The images were
evaluated with computer-aided detection.
IMPRESSION: No mammographic evidence of malignancy. A result letter of this
screening mammogram will be mailed directly to the patient.

RECOMMENDATION:
Screening mammogram in one year. (Code:T4-5-GWO)

BI-RADS CATEGORY  1: Negative.

## 2023-03-16 NOTE — Therapy (Signed)
OUTPATIENT PHYSICAL THERAPY BREAST CANCER TREATMENT   Patient Name: Elizabeth Benton MRN: [id number] DOB:[birth year], 77 y.o., female Today's Date: 03/16/2023  END OF SESSION:  PT End of Session - 03/16/23 0957     Visit Number 17    Number of Visits 21    Date for PT Re-Evaluation 03/31/23    PT Start Time 1000    PT Stop Time 1100    PT Time Calculation (min) 60 min    Activity Tolerance Patient tolerated treatment well    Behavior During Therapy [facility] for tasks assessed/performed              Past Medical History:  Diagnosis Date   Arthritis    Breast cancer ([place])    Depression    H/O migraine    HSV-2 (herpes simplex virus 2) infection    Hyperlipidemia    Hypertension    Lichen sclerosus    Migraine    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   Vaginal atrophy 12/29/2007   Vaginitis and vulvovaginitis 12/28/2004   Past Surgical History:  Procedure Laterality Date   ABDOMINAL HYSTERECTOMY  1987   BREAST RECONSTRUCTION WITH PLACEMENT OF TISSUE EXPANDER AND FLEX HD (ACELLULAR HYDRATED DERMIS) Bilateral 10/20/2022   Procedure: BREAST RECONSTRUCTION WITH PLACEMENT OF TISSUE EXPANDER AND FLEX HD (ACELLULAR HYDRATED DERMIS);  Surgeon: Lennice Sites, MD;  Location: [place];  Service: Plastics;  Laterality: Bilateral;   DILATION AND CURETTAGE OF UTERUS     MASTECTOMY W/ SENTINEL NODE BIOPSY Right 10/20/2022   Procedure: RIGHT MASTECTOMY WITH RIGHT SENTINEL LYMPH NODE BIOPSY;  Surgeon: Stark Klein, MD;  Location: [place];  Service: General;  Laterality: Right;   REMOVAL OF BILATERAL TISSUE EXPANDERS WITH PLACEMENT OF BILATERAL BREAST IMPLANTS Bilateral 01/08/2023   Procedure: Removal of bilateral tissue expanders;  Surgeon: [name], MD;  Location: [place];  Service: Plastics;  Laterality: Bilateral;   TONSILLECTOMY     TOTAL MASTECTOMY Left 10/20/2022   Procedure: LEFT TOTAL MASTECTOMY;  Surgeon: Stark Klein, MD;  Location: [place];  Service: General;   Laterality: Left;   TUBAL LIGATION     WISDOM TOOTH EXTRACTION     Patient Active Problem List   Diagnosis Date Noted   S/P breast reconstruction 10/20/2022   Genetic testing 08/24/2022   Family history of breast cancer 08/06/2022   Family history of prostate cancer 08/06/2022   Malignant neoplasm of upper-outer quadrant of right breast in female, estrogen receptor positive ([place]) 07/31/2022   Abnormal glucose level 12/05/2020   Constipation 12/05/2020   Lumbago with sciatica 12/05/2020   Osteopenia 12/05/2020   Overweight 12/05/2020   Screening for malignant neoplasm of colon 12/05/2020   Vitamin D deficiency 12/05/2020   Essential hypertension 11/30/2019   LBBB (left bundle branch block) 11/30/2019   Exertional dyspnea 99991111   Lichen sclerosus 99991111   Mixed hyperlipidemia    HSV-2 (herpes simplex virus 2) infection    Depression    H/O migraine    Monilia infection    Vaginal atrophy    Wears glasses 06/23/2011   Sinus problem/runny nose 06/23/2011   Mass on back 06/23/2011    PCP: Willey Blade, MD  REFERRING PROVIDER: Nicholas Lose, MD  REFERRING DIAG: Right Breast Cancer  THERAPY DIAG:  Malignant neoplasm of upper-outer quadrant of right breast in female, estrogen receptor positive ([place])  Abnormal posture  Stiffness of left shoulder, not elsewhere classified  Stiffness of right shoulder, not elsewhere classified  Localized swelling of chest  wall  Rationale for Evaluation and Treatment: Rehabilitation  ONSET DATE: 07/24/2022  SUBJECTIVE:                                                                                                                                                                                           SUBJECTIVE STATEMENT I had a little bug over the weekend but I feel OK now. I was achy all over and had a fever. I feel stiff and really like my left chest is bigger. I am getting frustrated.  PERTINENT HISTORY:  Patient was  diagnosed on 07/24/2022 with right grade 2 Invasive Lobular Carcinoma. It measures 1.2 cm and is located in the upper-outer quadrant. It is ER 95%, PR 80% and HER 2 negative with a Ki67 of 40%. She is s/p a Bilateral Mastectomy on 10/20/2022  for Right Breast Cancer and with left prophylactic and bilateral tissue expanders .  She has since had the expanders removed  and bilateral subtotal capsulotomies on Jan. 12,2024. She had 90 cc  of fluid aspirated on 01/28/2023,and 50 cc of  fluid aspirated 02/08/2023 from the left breast area  PATIENT GOALS:  Reassess how my recovery is going related to arm function, pain, and swelling since expander removal and capsulectomies  PAIN:  Are you having pain?  PAIN:  Are you having pain? Yes mostly soreness NPRS scale: 2-3/10 discomfort bilateral trunk region under armpits, and lower rib cage. Pain location: both lateral trunk,  under arm pits Pain orientation: Right and left PAIN TYPE: tight and bruising sort of, heavy at end of day Pain description: constant  Aggravating factors: Relieving factors: compression, MLD, tumeric  PRECAUTIONS: Recent Surgery, right UE Lymphedema risk, DDD, LBP  ACTIVITY LEVEL / LEISURE: ,  not returned to work, goes back March 4   OBJECTIVE:   PATIENT SURVEYS:  QUICK DASH: Initial eval 61.36,  Re-eval  39%  OBSERVATIONS: Pt with larger right breast  than secondary to expander filled more. Mastectomy incisions are healed with glue still present. Left axillary region with area of swelling/tissue greater than right. Pt will be having an [place] today on the left. Having expanders removed in early January.  02/17/2023 Ballotable fluid noted left chest region, Right incision area irregular and folding inward. Left incision healed but also irregular without folding noted  POSTURE:  Forward head, rounded shoulders  LYMPHEDEMA ASSESSMENT:     UPPER EXTREMITY AROM/PROM:   A/PROM RIGHT   eval   Right 12/02/2022 RIGHT 12/29/2022  RIGHT 01/05/2023 Right Re-eval RIGHT 03/02/2023  Shoulder extension 60 49 63   57 tight axilla 60  Flexion  128 axilla 143  Shoulder abduction 168 68 145 149 tight axilla 130 143  Shoulder internal rotation 55    60 70  Shoulder external rotation 112    98 109                          (Blank rows = not tested)   A/PROM LEFT   eval LEFT 2023 LEFT 12/29/2022 LEFT Re-eval LEFT 03/02/2023  Shoulder extension 58 46 65 52 tight 60  Shoulder flexion 159 120 160 155 tight chest 160  Shoulder abduction 165 90 156 123 tight axilla 158  Shoulder internal rotation 65   55 65  Shoulder external rotation 112   88 107                          (Blank rows = not tested)     CERVICAL AROM: All within functional  limits: Decreased 20 % Rotation and 50% SB         UPPER EXTREMITY STRENGTH: WNL     LYMPHEDEMA ASSESSMENTS:    LANDMARK RIGHT   eval Right 12/02/2022 RIGHT 02/17/2023  10 cm proximal to olecranon process 28.5 28.7 28.6  Olecranon process 24.6 24.6 24.6  10 cm proximal to ulnar styloid process 21.8 21.7 21.4  Just proximal to ulnar styloid process 16.4 16.3 16.4  Across hand at thumb web space 18.3 18.2 18.4  At base of 2nd digit 6.5 6.6 6.4  (Blank rows = not tested)   LANDMARK LEFT   eval LEFT 12/02/2022  10 cm proximal  29.3 29.7  Olecranon process  24.9 24.7  10 cm proximal to ulnar styloid process 21.0 21.4  Just proximal to ulnar styloid process 16.3 16.3  Across hand at thumb web space 18.0 17.9  At base of 2nd digit 6.45 6.4  (Blank rows = not tested)      Surgery type/Date: 10/20/2022 bilateral Mastectomy with right SLNB, 01/08/2023 Removal of bilateral tissue expanders and bilateral capsulectomies Number of lymph nodes removed: 0/2 right Current/past treatment (chemo, radiation, hormone therapy): No chemo required, will start anti-estrogen therapy Other symptoms:  Heaviness/tightness Yes Pain Yes Pitting edema No Infections No Decreased scar mobility  Yes Stemmer sign No  TREATMENT TODAY 03/16/2023  Pulleys x 1:30 flexion, scaption, abduction Standing lat stretch x 4, Wall slides on left with pt shown how to apply pressure with right hand to rib cage area x 5 NTS for left UE with wrist oscillation Instructed pt while performing MLD to left chest area of swelling. Supraclavicular region, 5 diaphragmatic breaths, left inguinal LN's, left axillo inguinal pathway, left chest directing toward pathway multiple times avoiding darker tender region on left chest and ending with LN's. Had pt perform all steps and gave written instructions. Advised pt to keep an eye on darker area of left chest , check for heat, pain to be sure it doesn't get larger and to avoid that area with MLD. Contact MD if painful or enlarges. PROM left shoulder with MFR to left axillary area of cording for flexion, scaption, abduction.  03/11/2023  Pulleys x 1:30 flexion, scaption, abd. To warm up Standing yellow theraband x 10 scapular retraction, extension, bilateral ER STM bilateral UT, pectorals, lats with cocoa butter PROM bilateral shoulder flexion, scaption, abduction, ER with intermittent MFR at axilla for cording Standing counter stretch x 4, standing single arm chest stretch bilaterally   03/09/2023  Pulleys x 1:30 flex,  scaption, Abd Standing jobes flexion  and scaption, abd x 10 STM bilateral UT, pectorals, lats with cocoa butter PROM bilateral shoulder flexion, scaption, abduction, ER with intermittent MFR at axilla for cording MFR bilateral axillary and upper arm cording with arm in D2 flexion  Supine scapular series x 10 with yellow except sword x 5  03/04/2023 Pulleys x 1:30 flex, scaption, Abd, Ball rolls flexion x 10, bilateral abd x 5 STM bilateral UT, pectorals, lats with cocoa butter PROM bilateral shoulder flexion, scaption, abduction, ER with intermittent MFR at axilla for cording MFR bilateral chest region bilaterally Supine scapular series x 10  including sword x 5 with yellow band   03/02/2023 STM bilateral UT, pectorals, lats with cocoa butter PROM bilateral shoulder flexion, scaption, abduction, ER with intermittent MFR at axilla for cording MFR bilateral chest region bilaterally Supine scapular series x 10 except sword with yellow band Updated HEP with band exercises Measured ROM 02/26/2023  STM bilateral UT, pectorals, lats with cocoa butter PROM bilateral shoulder flexion, scaption, abduction, ER with intermittent MFR at axilla for cording MFR bilateral chest region AROM supine bilateral Flexion, scaption, horizontal abduction x 5 Corner pec stretch and doorway pecs stretch x 3-4 ea, SB stretch x 2 ea Updated HEP with stretches  02/24/2023  Overhead pulleys x 1:30 flexion, scaption, abduction, ball rolls on wall x 5 flexion x 5 bilateral abduction STM bilateral UT, pectorals, lats with cocoa butter Supine wand flexion and scaption x 5  PROM bilateral shoulder flexion, scaption, abduction, ER with intermittent MFR at axilla MFR bilateral chest region Lower trunk rotation x 5 ea direction with arms outstretched   02/19/2023  Overhead pulleys x 1:30 flexion, scaption, abduction, ball rolls on wall x 8 flexion x 5 bilateral abduction Supine wand flexion and scaption 5 x 5 sec  PROM bilateral shoulder flexion, scaption, abduction, ER with intermittent MFR at axilla Lower trunk rotation x 5 ea direction with arms outstretched MLD To short neck, left axillary and inguinal LN's and left axillo-inguinal pathway to decrease swelling, Repeated on right side to decrease edema at axillary region/trunk  02/17/2023 Large piece of foam cut to cover the full width of her chest over incisions to help flatten incisions and decrease swelling and placed in large TG soft. Assisted pt with placement and fastening bra and patient reported that it felt much better with the foam. Advised to remove if she experiences any discomfort. Discussed  resuming 4 post op exercises for ROM     12/10/2022 Soft Tissue mobilization to bilateral UT, pectorals and Lats with cocoa butter in supine PROM bilateral shoulder flexion, scaption, abd, IR and ER with multiple VC's to relax Supine AROM bilateral shoulder flexion,scaption, horizontal abd in comfortable ROM x 5 ea  12/08/2022 Soft Tissue mobilization to bilateral UT, pectorals and Lats with cocoa butter in supine AAROM clasped hands flexion and stargazer x 5, wall slides x 5 PROM bilateral shoulder flexion, scaption, abd, IR and ER with multiple VC's to relax Tried supine wand x 2 but pt had trouble relaxing. Lower trunk rotation to the left x 4 to stretch lats/pecs gently   PATIENT EDUCATION:   Access Code: [email or web address] URL: [email or web address] Date: 02/26/2023 Prepared by: Cheral Almas  Exercises - Supine Lower Trunk Rotation  - 2 x daily - 7 x weekly - 1 sets - 3-4 reps - 20 hold - Corner Pec Major Stretch  - 2 x daily - 7 x weekly - 1 sets - 3-4 reps - 20 hold -  Single Arm Doorway Pec Stretch at 90 Degrees Abduction  - 2 x daily - 7 x weekly - 1 sets - 3-4 reps - 20 hold - TL Sidebending Stretch - Single Arm Overhead  - 2 x daily - 7 x weekly - 1 sets - 3-4 reps Education details: Reviewed 4 post op exercises. Did better with abduction sliding hand on wall Person educated: Patient Education method: Explanation and Demonstration Education comprehension: returned demonstration    HOME EXERCISE PROGRAM: Reviewed previously given post op HEP. Changed wall walk to wall slide and pt demonstrated good improvement Supine scapular series except sword x 10 with yellow ASSESSMENT:  CLINICAL IMPRESSION:   Pts left shoulder tighter today after being sick this weekend. Left chest with increased edema noted  left chest and darkening of skin in one region. Not red but tender and feels boggy. Pt to watch and contact MD if it enlarges or gets more painful/red. Pt bothered  today by tighter cords in left axillary region. Showed NTS to assist at home. Pt will also consider purchasing pulleys. Instructed pt in MLD avoiding darker area and gave printed instructions.  Pt will benefit from skilled therapeutic intervention to improve on the following deficits: Decreased knowledge of precautions, impaired UE functional use, pain, decreased ROM, postural dysfunction., edema   PT treatment/interventions: ADL/Self care home management, Therapeutic exercises, Therapeutic activity, Neuromuscular re-education, Patient/Family education, Self Care, Joint mobilization, Orthotic/Fit training, Dry Needling, Manual lymph drainage, scar mobilization, Manual therapy, and Re-evaluation   GOALS: Goals reviewed with patient? Yes  LONG TERM GOALS:  (STG=LTG)  GOALS Name Target Date  Goal status  1 Pt will demonstrate she has regained full shoulder ROM and function post operatively compared to baselines.  Baseline: 01/13/2023 03/31/2023 In progress  2 Pts quick dash will be improved to pre-surgery level of 36% 03/31/2023 In progress  3 Pt will be able to dress and bathe and perform light household chores independently 01/13/2023 MET 12/29/2022  4 Pt will have decreased pain by 50% overall 01/13/2023 03/31/2023 MET 12/29/2022 Not met since new surgery IN PROGRESS  5. Pt will resume usual home and work activities with minimal complaint 03/31/2023      PLAN:  PT FREQUENCY/DURATION: 2x/week x 6 weeks  PLAN FOR NEXT SESSION: Left UE cording,Check swelling at  Left chest, Did she do MLD, get pulleys?,STM bilateral UQ, AAROM, PROM bilateral shoulders,  ABC strength handout,MLD prn especially to left  chest axillary region,   [facility]  [address], [address]  [place] [address]  [phone]  After Breast Cancer Class It is recommended you attend the ABC class to be educated on lymphedema risk reduction. This class is free of charge and lasts for 1 hour. It is a  1-time class. You will need to download the Webex app either on your phone or computer. We will send you a link the night before or the morning of the class. You should be able to click on that link to join the class. This is not a confidential class. You don't have to turn your camera on, but other participants may be able to see your email address.  Scar massage You can begin gentle scar massage to you incision sites. Gently place one hand on the incision and move the skin (without sliding on the skin) in various directions. Do this for a few minutes and then you can gently massage either coconut oil or vitamin E cream into the scars.  Compression  garment You should continue wearing your compression bra until you feel like you no longer have swelling.  Home exercise Program Continue doing the exercises you were given until you feel like you can do them without feeling any tightness at the end.   Walking Program Studies show that 30 minutes of walking per day (fast enough to elevate your heart rate) can significantly reduce the risk of a cancer recurrence. If you can't walk due to other medical reasons, we encourage you to find another activity you could do (like a stationary bike or water exercise).  Posture After breast cancer surgery, people frequently sit with rounded shoulders posture because it puts their incisions on slack and feels better. If you sit like this and scar tissue forms in that position, you can become very tight and have pain sitting or standing with good posture. Try to be aware of your posture and sit and stand up tall to heal properly.  Follow up PT: It is recommended you return every 3 months for the first 3 years following surgery to be assessed on the SOZO machine for an L-Dex score. This helps prevent clinically significant lymphedema in 95% of patients. These follow up screens are 10 minute appointments that you are not billed for.  Claris Pong, PT 03/16/2023,  11:14 AM

## 2023-03-16 NOTE — Patient Instructions (Addendum)
Manual Lymph Drainage for Left Breast.  Do daily.  Do slowly. Use flat hands with just enough pressure to stretch the skin. Do not slide over the skin, but move the skin with the hand you're using. Lie down or sit comfortably (in a recliner, for example) to do this.  Hug yourself:  cross arms and do circles at collar bones near neck 5-7 times (to "wake up" lots of lymph nodes in this area). Or use right hand on left side of neck.  Take slow deep breaths, allowing your belly to balloon out as your breathe in, 5x (to "wake up" abdominal lymph nodes to take on extra fluid).  Left groin area, at panty line--stretch skin in small circles to stimulate lymph nodes 5-7x.  Redirect fluid from left armpit toward left groin (cup your hand around the curve of your left side and do 3-4 "pumps" from armpit to groin) 3-4x down your side.  Redirect fluid from left chest toward  pathway  6) Then repeat #4 above.  7) End with repeating #3 above     [facility] [address] [place]   [address] [phone]

## 2023-03-18 ENCOUNTER — Ambulatory Visit: Payer: 59

## 2023-03-18 DIAGNOSIS — R222 Localized swelling, mass and lump, trunk: Secondary | ICD-10-CM

## 2023-03-18 DIAGNOSIS — R293 Abnormal posture: Secondary | ICD-10-CM

## 2023-03-18 DIAGNOSIS — M25611 Stiffness of right shoulder, not elsewhere classified: Secondary | ICD-10-CM

## 2023-03-18 DIAGNOSIS — C50411 Malignant neoplasm of upper-outer quadrant of right female breast: Secondary | ICD-10-CM | POA: Diagnosis not present

## 2023-03-18 DIAGNOSIS — M25612 Stiffness of left shoulder, not elsewhere classified: Secondary | ICD-10-CM

## 2023-03-18 DIAGNOSIS — Z170 Estrogen receptor positive status [ER+]: Secondary | ICD-10-CM

## 2023-03-18 NOTE — Therapy (Signed)
OUTPATIENT PHYSICAL THERAPY BREAST CANCER TREATMENT   Patient Name: Elizabeth Benton MRN: [id number] DOB:09-20-1946, 77 y.o., female Today's Date: 03/18/2023  END OF SESSION:  PT End of Session - 03/18/23 1003     Visit Number 18    Number of Visits 21    Date for PT Re-Evaluation 03/31/23    PT Start Time 1003    PT Stop Time 1053    PT Time Calculation (min) 50 min    Activity Tolerance Patient tolerated treatment well    Behavior During Therapy [facility] for tasks assessed/performed              Past Medical History:  Diagnosis Date   Arthritis    Breast cancer ([place])    Depression    H/O migraine    HSV-2 (herpes simplex virus 2) infection    Hyperlipidemia    Hypertension    Lichen sclerosus    Migraine    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   Vaginal atrophy 12/29/2007   Vaginitis and vulvovaginitis 12/28/2004   Past Surgical History:  Procedure Laterality Date   ABDOMINAL HYSTERECTOMY  1987   BREAST RECONSTRUCTION WITH PLACEMENT OF TISSUE EXPANDER AND FLEX HD (ACELLULAR HYDRATED DERMIS) Bilateral 10/20/2022   Procedure: BREAST RECONSTRUCTION WITH PLACEMENT OF TISSUE EXPANDER AND FLEX HD (ACELLULAR HYDRATED DERMIS);  Surgeon: Lennice Sites, MD;  Location: [place];  Service: Plastics;  Laterality: Bilateral;   DILATION AND CURETTAGE OF UTERUS     MASTECTOMY W/ SENTINEL NODE BIOPSY Right 10/20/2022   Procedure: RIGHT MASTECTOMY WITH RIGHT SENTINEL LYMPH NODE BIOPSY;  Surgeon: Stark Klein, MD;  Location: [place];  Service: General;  Laterality: Right;   REMOVAL OF BILATERAL TISSUE EXPANDERS WITH PLACEMENT OF BILATERAL BREAST IMPLANTS Bilateral 01/08/2023   Procedure: Removal of bilateral tissue expanders;  Surgeon: [name], MD;  Location: [place];  Service: Plastics;  Laterality: Bilateral;   TONSILLECTOMY     TOTAL MASTECTOMY Left 10/20/2022   Procedure: LEFT TOTAL MASTECTOMY;  Surgeon: Stark Klein, MD;  Location: [place];  Service: General;   Laterality: Left;   TUBAL LIGATION     WISDOM TOOTH EXTRACTION     Patient Active Problem List   Diagnosis Date Noted   S/P breast reconstruction 10/20/2022   Genetic testing 08/24/2022   Family history of breast cancer 08/06/2022   Family history of prostate cancer 08/06/2022   Malignant neoplasm of upper-outer quadrant of right breast in female, estrogen receptor positive ([place]) 07/31/2022   Abnormal glucose level 12/05/2020   Constipation 12/05/2020   Lumbago with sciatica 12/05/2020   Osteopenia 12/05/2020   Overweight 12/05/2020   Screening for malignant neoplasm of colon 12/05/2020   Vitamin D deficiency 12/05/2020   Essential hypertension 11/30/2019   LBBB (left bundle branch block) 11/30/2019   Exertional dyspnea 99991111   Lichen sclerosus 99991111   Mixed hyperlipidemia    HSV-2 (herpes simplex virus 2) infection    Depression    H/O migraine    Monilia infection    Vaginal atrophy    Wears glasses 06/23/2011   Sinus problem/runny nose 06/23/2011   Mass on back 06/23/2011    PCP: Willey Blade, MD  REFERRING PROVIDER: Nicholas Lose, MD  REFERRING DIAG: Right Breast Cancer  THERAPY DIAG:  Malignant neoplasm of upper-outer quadrant of right breast in female, estrogen receptor positive ([place])  Abnormal posture  Stiffness of left shoulder, not elsewhere classified  Stiffness of right shoulder, not elsewhere classified  Localized swelling of chest  wall  Rationale for Evaluation and Treatment: Rehabilitation  ONSET DATE: 07/24/2022  SUBJECTIVE:                                                                                                                                                                                           SUBJECTIVE STATEMENT  I got my pulleys yesterday and I used them this am. I am feeling a little better today. The cording on the left is still bothering me. I put the compression bra on with the pads and it is giving me some  relief. I keep it on until about 8 pm and I'm committed to doing that again.  PERTINENT HISTORY:  Patient was diagnosed on 07/24/2022 with right grade 2 Invasive Lobular Carcinoma. It measures 1.2 cm and is located in the upper-outer quadrant. It is ER 95%, PR 80% and HER 2 negative with a Ki67 of 40%. She is s/p a Bilateral Mastectomy on 10/20/2022  for Right Breast Cancer and with left prophylactic and bilateral tissue expanders .  She has since had the expanders removed  and bilateral subtotal capsulotomies on Jan. 12,2024. She had 90 cc  of fluid aspirated on 01/28/2023,and 50 cc of  fluid aspirated 02/08/2023 from the left breast area  PATIENT GOALS:  Reassess how my recovery is going related to arm function, pain, and swelling since expander removal and capsulectomies  PAIN:  Are you having pain?  PAIN:  Are you having pain? Yes mostly soreness NPRS scale: 2/10 discomfort left trunk region under armpit, and lower rib cage. Pain location: both lateral trunk,  under arm pits Pain orientation: Right left PAIN TYPE: tight and bruising sort of, heavy at end of day Pain description: intermittent Aggravating factors: Relieving factors: compression, MLD, tumeric  PRECAUTIONS: Recent Surgery, right UE Lymphedema risk, DDD, LBP  ACTIVITY LEVEL / LEISURE: ,  not returned to work, goes back March 4   OBJECTIVE:   PATIENT SURVEYS:  QUICK DASH: Initial eval 61.36,  Re-eval  39%  OBSERVATIONS: Pt with larger right breast  than secondary to expander filled more. Mastectomy incisions are healed with glue still present. Left axillary region with area of swelling/tissue greater than right. Pt will be having an [place] today on the left. Having expanders removed in early January.  02/17/2023 Ballotable fluid noted left chest region, Right incision area irregular and folding inward. Left incision healed but also irregular without folding noted  POSTURE:  Forward head, rounded shoulders  LYMPHEDEMA  ASSESSMENT:     UPPER EXTREMITY AROM/PROM:   A/PROM RIGHT   eval   Right 12/02/2022 RIGHT 12/29/2022 RIGHT 01/05/2023 Right Re-eval  RIGHT 03/02/2023  Shoulder extension 60 49 63   57 tight axilla 60  Flexion      128 axilla 143  Shoulder abduction 168 68 145 149 tight axilla 130 143  Shoulder internal rotation 55    60 70  Shoulder external rotation 112    98 109                          (Blank rows = not tested)   A/PROM LEFT   eval LEFT 2023 LEFT 12/29/2022 LEFT Re-eval LEFT 03/02/2023  Shoulder extension 58 46 65 52 tight 60  Shoulder flexion 159 120 160 155 tight chest 160  Shoulder abduction 165 90 156 123 tight axilla 158  Shoulder internal rotation 65   55 65  Shoulder external rotation 112   88 107                          (Blank rows = not tested)     CERVICAL AROM: All within functional  limits: Decreased 20 % Rotation and 50% SB         UPPER EXTREMITY STRENGTH: WNL     LYMPHEDEMA ASSESSMENTS:    LANDMARK RIGHT   eval Right 12/02/2022 RIGHT 02/17/2023  10 cm proximal to olecranon process 28.5 28.7 28.6  Olecranon process 24.6 24.6 24.6  10 cm proximal to ulnar styloid process 21.8 21.7 21.4  Just proximal to ulnar styloid process 16.4 16.3 16.4  Across hand at thumb web space 18.3 18.2 18.4  At base of 2nd digit 6.5 6.6 6.4  (Blank rows = not tested)   LANDMARK LEFT   eval LEFT 12/02/2022  10 cm proximal  29.3 29.7  Olecranon process  24.9 24.7  10 cm proximal to ulnar styloid process 21.0 21.4  Just proximal to ulnar styloid process 16.3 16.3  Across hand at thumb web space 18.0 17.9  At base of 2nd digit 6.45 6.4  (Blank rows = not tested)      Surgery type/Date: 10/20/2022 bilateral Mastectomy with right SLNB, 01/08/2023 Removal of bilateral tissue expanders and bilateral capsulectomies Number of lymph nodes removed: 0/2 right Current/past treatment (chemo, radiation, hormone therapy): No chemo required, will start anti-estrogen therapy Other  symptoms:  Heaviness/tightness Yes Pain Yes Pitting edema No Infections No Decreased scar mobility Yes Stemmer sign No  TREATMENT TODAY  03/18/2023 Did pulleys at home Scapular retraction yellow x 10, shoulder extension x 10 yellow, bilateral ER x 10 yellow Supine STM to left UT, Pecs, Lats with cocoa butter MFR to left axillary cording with arm resting on pillow PROM left shoulder going slowly into scaption and abduction with MFR at axilla to decrease soreness. MLD to left chest area of swelling. Supraclavicular region, 5 diaphragmatic breaths, left inguinal LN's, left axillo inguinal pathway, left chest directing toward pathway multiple times avoiding darker tender region on left chest and ending with LN's.   03/16/2023  Pulleys x 1:30 flexion, scaption, abduction Standing lat stretch x 4, Wall slides on left with pt shown how to apply pressure with right hand to rib cage area x 5 NTS for left UE with wrist oscillation Instructed pt while performing MLD to left chest area of swelling. Supraclavicular region, 5 diaphragmatic breaths, left inguinal LN's, left axillo inguinal pathway, left chest directing toward pathway multiple times avoiding darker tender region on left chest and ending with LN's. Had pt perform all steps and gave  written instructions. Advised pt to keep an eye on darker area of left chest , check for heat, pain to be sure it doesn't get larger and to avoid that area with MLD. Contact MD if painful or enlarges. PROM left shoulder with MFR to left axillary area of cording for flexion, scaption, abduction.  03/11/2023  Pulleys x 1:30 flexion, scaption, abd. To warm up Standing yellow theraband x 10 scapular retraction, extension, bilateral ER STM bilateral UT, pectorals, lats with cocoa butter PROM bilateral shoulder flexion, scaption, abduction, ER with intermittent MFR at axilla for cording Standing counter stretch x 4, standing single arm chest stretch bilaterally    03/09/2023  Pulleys x 1:30 flex, scaption, Abd Standing jobes flexion  and scaption, abd x 10 STM bilateral UT, pectorals, lats with cocoa butter PROM bilateral shoulder flexion, scaption, abduction, ER with intermittent MFR at axilla for cording MFR bilateral axillary and upper arm cording with arm in D2 flexion  Supine scapular series x 10 with yellow except sword x 5  03/04/2023 Pulleys x 1:30 flex, scaption, Abd, Ball rolls flexion x 10, bilateral abd x 5 STM bilateral UT, pectorals, lats with cocoa butter PROM bilateral shoulder flexion, scaption, abduction, ER with intermittent MFR at axilla for cording MFR bilateral chest region bilaterally Supine scapular series x 10 including sword x 5 with yellow band   03/02/2023 STM bilateral UT, pectorals, lats with cocoa butter PROM bilateral shoulder flexion, scaption, abduction, ER with intermittent MFR at axilla for cording MFR bilateral chest region bilaterally Supine scapular series x 10 except sword with yellow band Updated HEP with band exercises Measured ROM 02/26/2023  STM bilateral UT, pectorals, lats with cocoa butter PROM bilateral shoulder flexion, scaption, abduction, ER with intermittent MFR at axilla for cording MFR bilateral chest region AROM supine bilateral Flexion, scaption, horizontal abduction x 5 Corner pec stretch and doorway pecs stretch x 3-4 ea, SB stretch x 2 ea Updated HEP with stretches  02/24/2023  Overhead pulleys x 1:30 flexion, scaption, abduction, ball rolls on wall x 5 flexion x 5 bilateral abduction STM bilateral UT, pectorals, lats with cocoa butter Supine wand flexion and scaption x 5  PROM bilateral shoulder flexion, scaption, abduction, ER with intermittent MFR at axilla MFR bilateral chest region Lower trunk rotation x 5 ea direction with arms outstretched   02/19/2023  Overhead pulleys x 1:30 flexion, scaption, abduction, ball rolls on wall x 8 flexion x 5 bilateral abduction Supine  wand flexion and scaption 5 x 5 sec  PROM bilateral shoulder flexion, scaption, abduction, ER with intermittent MFR at axilla Lower trunk rotation x 5 ea direction with arms outstretched MLD To short neck, left axillary and inguinal LN's and left axillo-inguinal pathway to decrease swelling, Repeated on right side to decrease edema at axillary region/trunk  02/17/2023 Large piece of foam cut to cover the full width of her chest over incisions to help flatten incisions and decrease swelling and placed in large TG soft. Assisted pt with placement and fastening bra and patient reported that it felt much better with the foam. Advised to remove if she experiences any discomfort. Discussed resuming 4 post op exercises for ROM     12/10/2022 Soft Tissue mobilization to bilateral UT, pectorals and Lats with cocoa butter in supine PROM bilateral shoulder flexion, scaption, abd, IR and ER with multiple VC's to relax Supine AROM bilateral shoulder flexion,scaption, horizontal abd in comfortable ROM x 5 ea  12/08/2022 Soft Tissue mobilization to bilateral UT, pectorals and Lats with  cocoa butter in supine AAROM clasped hands flexion and stargazer x 5, wall slides x 5 PROM bilateral shoulder flexion, scaption, abd, IR and ER with multiple VC's to relax Tried supine wand x 2 but pt had trouble relaxing. Lower trunk rotation to the left x 4 to stretch lats/pecs gently   PATIENT EDUCATION:   Access Code: [email or web address] URL: [email or web address] Date: 02/26/2023 Prepared by: Cheral Almas  Exercises - Supine Lower Trunk Rotation  - 2 x daily - 7 x weekly - 1 sets - 3-4 reps - 20 hold - Corner Pec Major Stretch  - 2 x daily - 7 x weekly - 1 sets - 3-4 reps - 20 hold - Single Arm Doorway Pec Stretch at 90 Degrees Abduction  - 2 x daily - 7 x weekly - 1 sets - 3-4 reps - 20 hold - TL Sidebending Stretch - Single Arm Overhead  - 2 x daily - 7 x weekly - 1 sets - 3-4 reps Education details:  Reviewed 4 post op exercises. Did better with abduction sliding hand on wall Person educated: Patient Education method: Explanation and Demonstration Education comprehension: returned demonstration    HOME EXERCISE PROGRAM: Reviewed previously given post op HEP. Changed wall walk to wall slide and pt demonstrated good improvement Supine scapular series except sword x 10 with yellow ASSESSMENT:  CLINICAL IMPRESSION:   Pts left shoulder ROM very limited today with A/PROM but PROM improved to WNL after ROM and MFR techniques. AROM remained limited but improved. Left chest area is very swollen today and darkened area remains. She is tender and sore. Advised pt to contact MD office to be seen before the weekend  Pt will benefit from skilled therapeutic intervention to improve on the following deficits: Decreased knowledge of precautions, impaired UE functional use, pain, decreased ROM, postural dysfunction., edema   PT treatment/interventions: ADL/Self care home management, Therapeutic exercises, Therapeutic activity, Neuromuscular re-education, Patient/Family education, Self Care, Joint mobilization, Orthotic/Fit training, Dry Needling, Manual lymph drainage, scar mobilization, Manual therapy, and Re-evaluation   GOALS: Goals reviewed with patient? Yes  LONG TERM GOALS:  (STG=LTG)  GOALS Name Target Date  Goal status  1 Pt will demonstrate she has regained full shoulder ROM and function post operatively compared to baselines.  Baseline: 01/13/2023 03/31/2023 In progress  2 Pts quick dash will be improved to pre-surgery level of 36% 03/31/2023 In progress  3 Pt will be able to dress and bathe and perform light household chores independently 01/13/2023 MET 12/29/2022  4 Pt will have decreased pain by 50% overall 01/13/2023 03/31/2023 MET 12/29/2022 Not met since new surgery IN PROGRESS  5. Pt will resume usual home and work activities with minimal complaint 03/31/2023      PLAN:  PT  FREQUENCY/DURATION: 2x/week x 6 weeks  PLAN FOR NEXT SESSION: Left UE cording,Check swelling at  Left chest, Did she do MLD, get pulleys; YES,STM bilateral UQ, AAROM, PROM bilateral shoulders,  ABC strength handout,MLD prn especially to left  chest axillary region,   [facility]  [address], [address]  [place] [address]  [phone]  After Breast Cancer Class It is recommended you attend the ABC class to be educated on lymphedema risk reduction. This class is free of charge and lasts for 1 hour. It is a 1-time class. You will need to download the Webex app either on your phone or computer. We will send you a link the night before or the morning of the class. You should  be able to click on that link to join the class. This is not a confidential class. You don't have to turn your camera on, but other participants may be able to see your email address.  Scar massage You can begin gentle scar massage to you incision sites. Gently place one hand on the incision and move the skin (without sliding on the skin) in various directions. Do this for a few minutes and then you can gently massage either coconut oil or vitamin E cream into the scars.  Compression garment You should continue wearing your compression bra until you feel like you no longer have swelling.  Home exercise Program Continue doing the exercises you were given until you feel like you can do them without feeling any tightness at the end.   Walking Program Studies show that 30 minutes of walking per day (fast enough to elevate your heart rate) can significantly reduce the risk of a cancer recurrence. If you can't walk due to other medical reasons, we encourage you to find another activity you could do (like a stationary bike or water exercise).  Posture After breast cancer surgery, people frequently sit with rounded shoulders posture because it puts their incisions on slack and feels better. If you sit like  this and scar tissue forms in that position, you can become very tight and have pain sitting or standing with good posture. Try to be aware of your posture and sit and stand up tall to heal properly.  Follow up PT: It is recommended you return every 3 months for the first 3 years following surgery to be assessed on the SOZO machine for an L-Dex score. This helps prevent clinically significant lymphedema in 95% of patients. These follow up screens are 10 minute appointments that you are not billed for.  Claris Pong, PT 03/18/2023, 11:02 AM

## 2023-03-24 ENCOUNTER — Encounter: Payer: Self-pay | Admitting: Plastic Surgery

## 2023-03-24 ENCOUNTER — Ambulatory Visit (INDEPENDENT_AMBULATORY_CARE_PROVIDER_SITE_OTHER): Payer: 59 | Admitting: Plastic Surgery

## 2023-03-24 VITALS — BP 147/85 | HR 97 | Ht 60.0 in | Wt 127.8 lb

## 2023-03-24 DIAGNOSIS — Z9889 Other specified postprocedural states: Secondary | ICD-10-CM

## 2023-03-24 DIAGNOSIS — N6489 Other specified disorders of breast: Secondary | ICD-10-CM

## 2023-03-24 NOTE — Progress Notes (Signed)
Elizabeth Benton returns today after removal of her bilateral tissue expanders.  The right side is doing well and she has no complaints.  On the left side she is states that it feels tight and she has limitation in the range of motion in her arm.  On physical exam the skin is somewhat tented and there is a ballotable fluid palpable.  After obtaining her consent I aspirated the area of concern.  I removed 120 mL of serous appearing fluid.  After the aspiration the chest wall was again flat and the patient had much improved range of motion in the left arm.  I have asked her to refrain from any resistance training with her physical therapist over the next 2 weeks.  She may continue range of motion exercises.  She may continue her daily walking.  She will continue compression.  And she will follow-up with me in 1 week.  If she has accumulation of the fluid we will aspirated again if it accumulates a third time she will need to go back to the operating room for washout and drain placement.  Patient understands and agrees.  Follow-up in 1 week

## 2023-03-30 ENCOUNTER — Ambulatory Visit: Payer: 59 | Attending: Hematology and Oncology

## 2023-03-30 DIAGNOSIS — R293 Abnormal posture: Secondary | ICD-10-CM | POA: Insufficient documentation

## 2023-03-30 DIAGNOSIS — Z170 Estrogen receptor positive status [ER+]: Secondary | ICD-10-CM | POA: Diagnosis present

## 2023-03-30 DIAGNOSIS — M25611 Stiffness of right shoulder, not elsewhere classified: Secondary | ICD-10-CM | POA: Diagnosis present

## 2023-03-30 DIAGNOSIS — R222 Localized swelling, mass and lump, trunk: Secondary | ICD-10-CM | POA: Insufficient documentation

## 2023-03-30 DIAGNOSIS — M25612 Stiffness of left shoulder, not elsewhere classified: Secondary | ICD-10-CM

## 2023-03-30 DIAGNOSIS — C50411 Malignant neoplasm of upper-outer quadrant of right female breast: Secondary | ICD-10-CM | POA: Diagnosis present

## 2023-03-30 NOTE — Therapy (Signed)
OUTPATIENT PHYSICAL THERAPY BREAST CANCER TREATMENT   Patient Name: Elizabeth Benton MRN: [id number] DOB:03-07-1946, 77 y.o., female Today's Date: 03/30/2023  END OF SESSION:  PT End of Session - 03/30/23 1003     Visit Number 19    Number of Visits 21    Date for PT Re-Evaluation 05/11/23    PT Start Time 1004    PT Stop Time 1056    PT Time Calculation (min) 52 min    Activity Tolerance Patient tolerated treatment well    Behavior During Therapy [facility] for tasks assessed/performed              Past Medical History:  Diagnosis Date   Arthritis    Breast cancer    Depression    H/O migraine    HSV-2 (herpes simplex virus 2) infection    Hyperlipidemia    Hypertension    Lichen sclerosus    Migraine    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   Vaginal atrophy 12/29/2007   Vaginitis and vulvovaginitis 12/28/2004   Past Surgical History:  Procedure Laterality Date   ABDOMINAL HYSTERECTOMY  1987   BREAST RECONSTRUCTION WITH PLACEMENT OF TISSUE EXPANDER AND FLEX HD (ACELLULAR HYDRATED DERMIS) Bilateral 10/20/2022   Procedure: BREAST RECONSTRUCTION WITH PLACEMENT OF TISSUE EXPANDER AND FLEX HD (ACELLULAR HYDRATED DERMIS);  Surgeon: Lennice Sites, MD;  Location: [place];  Service: Plastics;  Laterality: Bilateral;   DILATION AND CURETTAGE OF UTERUS     MASTECTOMY W/ SENTINEL NODE BIOPSY Right 10/20/2022   Procedure: RIGHT MASTECTOMY WITH RIGHT SENTINEL LYMPH NODE BIOPSY;  Surgeon: Stark Klein, MD;  Location: [place];  Service: General;  Laterality: Right;   REMOVAL OF BILATERAL TISSUE EXPANDERS WITH PLACEMENT OF BILATERAL BREAST IMPLANTS Bilateral 01/08/2023   Procedure: Removal of bilateral tissue expanders;  Surgeon: [name], MD;  Location: [place];  Service: Plastics;  Laterality: Bilateral;   TONSILLECTOMY     TOTAL MASTECTOMY Left 10/20/2022   Procedure: LEFT TOTAL MASTECTOMY;  Surgeon: Stark Klein, MD;  Location: [place];  Service: General;  Laterality:  Left;   TUBAL LIGATION     WISDOM TOOTH EXTRACTION     Patient Active Problem List   Diagnosis Date Noted   S/P breast reconstruction 10/20/2022   Genetic testing 08/24/2022   Family history of breast cancer 08/06/2022   Family history of prostate cancer 08/06/2022   Malignant neoplasm of upper-outer quadrant of right breast in female, estrogen receptor positive 07/31/2022   Abnormal glucose level 12/05/2020   Constipation 12/05/2020   Lumbago with sciatica 12/05/2020   Osteopenia 12/05/2020   Overweight 12/05/2020   Screening for malignant neoplasm of colon 12/05/2020   Vitamin D deficiency 12/05/2020   Essential hypertension 11/30/2019   LBBB (left bundle branch block) 11/30/2019   Exertional dyspnea 99991111   Lichen sclerosus 99991111   Mixed hyperlipidemia    HSV-2 (herpes simplex virus 2) infection    Depression    H/O migraine    Monilia infection    Vaginal atrophy    Wears glasses 06/23/2011   Sinus problem/runny nose 06/23/2011   Mass on back 06/23/2011    PCP: Willey Blade, MD  REFERRING PROVIDER: Nicholas Lose, MD  REFERRING DIAG: Right Breast Cancer  THERAPY DIAG:  Malignant neoplasm of upper-outer quadrant of right breast in female, estrogen receptor positive  Abnormal posture  Stiffness of left shoulder, not elsewhere classified  Stiffness of right shoulder, not elsewhere classified  Localized swelling of chest wall  Rationale  for Evaluation and Treatment: Rehabilitation  ONSET DATE: 07/24/2022  SUBJECTIVE:                                                                                                                                                                                           SUBJECTIVE STATEMENT I saw the MD on March 27th and they drained 120 cc of fluid from the left chest. It feels better but I think it is trying to build up again. I see MD on Thursday and if it builds up again they will go in and clean it up and put in a  drain. I am not supposed to do resistance exercises.  PERTINENT HISTORY:  Patient was diagnosed on 07/24/2022 with right grade 2 Invasive Lobular Carcinoma. It measures 1.2 cm and is located in the upper-outer quadrant. It is ER 95%, PR 80% and HER 2 negative with a Ki67 of 40%. She is s/p a Bilateral Mastectomy on 10/20/2022  for Right Breast Cancer and with left prophylactic and bilateral tissue expanders .  She has since had the expanders removed  and bilateral subtotal capsulotomies on Jan. 12,2024. She had 90 cc  of fluid aspirated on 01/28/2023,and 50 cc of  fluid aspirated 02/08/2023 from the left breast area  PATIENT GOALS:  Reassess how my recovery is going related to arm function, pain, and swelling since expander removal and capsulectomies  PAIN:  Are you having pain?  PAIN:  Are you having pain? Yes NPRS scale: 2-3/10 discomfort left trunk region under armpit, and lower rib cage. Pain location: left lateral trunk,  under arm pit on left Pain orientation: left PAIN TYPE: tight and bruising sort of,  Pain description: intermittent Aggravating factors: Relieving factors: compression, MLD, tumeric  PRECAUTIONS: Recent Surgery, right UE Lymphedema risk, DDD, LBP  ACTIVITY LEVEL / LEISURE: ,  not returned to work, goes back March 4   OBJECTIVE:   PATIENT SURVEYS:  QUICK DASH: Initial eval 61.36,  Re-eval  39%  OBSERVATIONS: Pt with larger right breast  than secondary to expander filled more. Mastectomy incisions are healed with glue still present. Left axillary region with area of swelling/tissue greater than right. Pt will be having an [place] today on the left. Having expanders removed in early January.  02/17/2023 Ballotable fluid noted left chest region, Right incision area irregular and folding inward. Left incision healed but also irregular without folding noted  POSTURE:  Forward head, rounded shoulders  LYMPHEDEMA ASSESSMENT:     UPPER EXTREMITY AROM/PROM:   A/PROM RIGHT    eval   Right 12/02/2022 RIGHT 12/29/2022 RIGHT 01/05/2023 Right Re-eval RIGHT 03/02/2023 Right 03/30/2023  Shoulder extension 60 49 63   57 tight axilla 60 59  Flexion      128 axilla 143 150  Shoulder abduction 168 68 145 149 tight axilla 130 143 174  Shoulder internal rotation 55    60 70 70  Shoulder external rotation 112    98 109 110                          (Blank rows = not tested)   A/PROM LEFT   eval LEFT 2023 LEFT 12/29/2022 LEFT Re-eval LEFT 03/02/2023 LEFT 03/30/2023  Shoulder extension 58 46 65 52 tight 60 43  Shoulder flexion 159 120 160 155 tight chest 160 145  Shoulder abduction 165 90 156 123 tight axilla 158 118  Shoulder internal rotation 65   55 65 60  Shoulder external rotation 112   88 107 95                          (Blank rows = not tested)     CERVICAL AROM: All within functional  limits: Decreased 20 % Rotation and 50% SB         UPPER EXTREMITY STRENGTH: WNL     LYMPHEDEMA ASSESSMENTS:    LANDMARK RIGHT   eval Right 12/02/2022 RIGHT 02/17/2023  10 cm proximal to olecranon process 28.5 28.7 28.6  Olecranon process 24.6 24.6 24.6  10 cm proximal to ulnar styloid process 21.8 21.7 21.4  Just proximal to ulnar styloid process 16.4 16.3 16.4  Across hand at thumb web space 18.3 18.2 18.4  At base of 2nd digit 6.5 6.6 6.4  (Blank rows = not tested)   LANDMARK LEFT   eval LEFT 12/02/2022  10 cm proximal  29.3 29.7  Olecranon process  24.9 24.7  10 cm proximal to ulnar styloid process 21.0 21.4  Just proximal to ulnar styloid process 16.3 16.3  Across hand at thumb web space 18.0 17.9  At base of 2nd digit 6.45 6.4  (Blank rows = not tested)      Surgery type/Date: 10/20/2022 bilateral Mastectomy with right SLNB, 01/08/2023 Removal of bilateral tissue expanders and bilateral capsulectomies Number of lymph nodes removed: 0/2 right Current/past treatment (chemo, radiation, hormone therapy): No chemo required, will start anti-estrogen  therapy Other symptoms:  Heaviness/tightness Yes Pain Yes Pitting edema No Infections No Decreased scar mobility Yes Stemmer sign No  TREATMENT TODAY  03/30/2023 Supine STM to left UT, Pecs, Lats and in SL to UT and scapular area with cocoa butter PROM left shoulder going slowly into scaption and abduction with MFR at axilla to decrease soreness. AROM bilateral flexion, scaption, horizontal abd x 5 Measured bilateral AROM shoulder Hold resistance exs per MD for 2 weeks 03/18/2023 Did pulleys at home Scapular retraction yellow x 10, shoulder extension x 10 yellow, bilateral ER x 10 yellow Supine STM to left UT, Pecs, Lats with cocoa butter MFR to left axillary cording with arm resting on pillow PROM left shoulder going slowly into scaption and abduction with MFR at axilla to decrease soreness. MLD to left chest area of swelling. Supraclavicular region, 5 diaphragmatic breaths, left inguinal LN's, left axillo inguinal pathway, left chest directing toward pathway multiple times avoiding darker tender region on left chest and ending with LN's.   03/16/2023  Pulleys x 1:30 flexion, scaption, abduction Standing lat stretch x 4, Wall slides on left with pt shown how to apply pressure  with right hand to rib cage area x 5 NTS for left UE with wrist oscillation Instructed pt while performing MLD to left chest area of swelling. Supraclavicular region, 5 diaphragmatic breaths, left inguinal LN's, left axillo inguinal pathway, left chest directing toward pathway multiple times avoiding darker tender region on left chest and ending with LN's. Had pt perform all steps and gave written instructions. Advised pt to keep an eye on darker area of left chest , check for heat, pain to be sure it doesn't get larger and to avoid that area with MLD. Contact MD if painful or enlarges. PROM left shoulder with MFR to left axillary area of cording for flexion, scaption, abduction.  03/11/2023  Pulleys x 1:30  flexion, scaption, abd. To warm up Standing yellow theraband x 10 scapular retraction, extension, bilateral ER STM bilateral UT, pectorals, lats with cocoa butter PROM bilateral shoulder flexion, scaption, abduction, ER with intermittent MFR at axilla for cording Standing counter stretch x 4, standing single arm chest stretch bilaterally   03/09/2023  Pulleys x 1:30 flex, scaption, Abd Standing jobes flexion  and scaption, abd x 10 STM bilateral UT, pectorals, lats with cocoa butter PROM bilateral shoulder flexion, scaption, abduction, ER with intermittent MFR at axilla for cording MFR bilateral axillary and upper arm cording with arm in D2 flexion  Supine scapular series x 10 with yellow except sword x 5  03/04/2023 Pulleys x 1:30 flex, scaption, Abd, Ball rolls flexion x 10, bilateral abd x 5 STM bilateral UT, pectorals, lats with cocoa butter PROM bilateral shoulder flexion, scaption, abduction, ER with intermittent MFR at axilla for cording MFR bilateral chest region bilaterally Supine scapular series x 10 including sword x 5 with yellow band   03/02/2023 STM bilateral UT, pectorals, lats with cocoa butter PROM bilateral shoulder flexion, scaption, abduction, ER with intermittent MFR at axilla for cording MFR bilateral chest region bilaterally Supine scapular series x 10 except sword with yellow band Updated HEP with band exercises Measured ROM 02/26/2023  STM bilateral UT, pectorals, lats with cocoa butter PROM bilateral shoulder flexion, scaption, abduction, ER with intermittent MFR at axilla for cording MFR bilateral chest region AROM supine bilateral Flexion, scaption, horizontal abduction x 5 Corner pec stretch and doorway pecs stretch x 3-4 ea, SB stretch x 2 ea Updated HEP with stretches     PATIENT EDUCATION:   Access Code: [email or web address] URL: [email or web address] Date: 02/26/2023 Prepared by: Cheral Almas  Exercises - Supine Lower Trunk Rotation  - 2  x daily - 7 x weekly - 1 sets - 3-4 reps - 20 hold - Corner Pec Major Stretch  - 2 x daily - 7 x weekly - 1 sets - 3-4 reps - 20 hold - Single Arm Doorway Pec Stretch at 90 Degrees Abduction  - 2 x daily - 7 x weekly - 1 sets - 3-4 reps - 20 hold - TL Sidebending Stretch - Single Arm Overhead  - 2 x daily - 7 x weekly - 1 sets - 3-4 reps Education details: Reviewed 4 post op exercises. Did better with abduction sliding hand on wall Person educated: Patient Education method: Explanation and Demonstration Education comprehension: returned demonstration    HOME EXERCISE PROGRAM: Reviewed previously given post op HEP. Changed wall walk to wall slide and pt demonstrated good improvement Supine scapular series except sword x 10 with yellow ASSESSMENT:  CLINICAL IMPRESSION:  Pt had been making excellent progress with bilateral shoulder AROM, however, she had a set back several weeks ago  when she developed a return of  ballotable fluid in the left chest region which is painful and limiting her left shoulder AROM. She had it drained last week and she feels better, but also feels it is starting to fill up again. Right shoulder ROM continues to do very well, however, left shoulder AROM is reduced from previous measurements due to left chest pain. She will meet with the plastic surgeon again on Thursday to see if it needs to be drained again. She has achieved or nearly achieved most goals for the right UE, but due to the recent set back with the left we will continue skilled therapy as appropriate and as deemed by MD after she sees MD on Thursday this week to continue to progress ROM and decrease pain.   Pt will benefit from skilled therapeutic intervention to improve on the following deficits: Decreased knowledge of precautions, impaired UE functional use, pain, decreased ROM, postural dysfunction., edema   PT treatment/interventions: ADL/Self care home management, Therapeutic exercises, Therapeutic  activity, Neuromuscular re-education, Patient/Family education, Self Care, Joint mobilization, Orthotic/Fit training, Dry Needling, Manual lymph drainage, scar mobilization, Manual therapy, and Re-evaluation   GOALS: Goals reviewed with patient? Yes  LONG TERM GOALS:  (STG=LTG)  GOALS Name Target Date  Goal status  1 Pt will demonstrate she has regained full shoulder ROM and function post operatively compared to baselines.  Baseline: 01/13/2023 03/31/2023 In progress Left shoulder now most limited due to seroma and fluid collection left chest, Right nearly WNL  2 Pts quick dash will be improved to pre-surgery level of 36% 03/31/2023 In progress  3 Pt will be able to dress and bathe and perform light household chores independently 01/13/2023 MET 12/29/2022 Now slightly limited on left 03/30/2023  4 Pt will have decreased pain by 50% overall 01/13/2023 03/31/2023 MET 12/29/2022 Not met since new surgery MET for right side 03/29/2022 In progress for left due to seroma  5. Pt will resume usual home and work activities with minimal complaint 03/31/2023 MET on right 03/30/2023     PLAN:  PT FREQUENCY/DURATION:1- 2x/week x 6 weeks  PLAN FOR NEXT SESSION:  Hold resistance per MD for 2 weeks but continue ROM, Left UE cording,Check swelling at  Left chest, Did she do MLD, get pulleys; YES,STM bilateral UQ, AAROM, PROM bilateral shoulders,  ABC strength handout,MLD prn especially to left  chest axillary region,   [facility]  [address], [address]  [place] [address]  [phone]  After Breast Cancer Class It is recommended you attend the ABC class to be educated on lymphedema risk reduction. This class is free of charge and lasts for 1 hour. It is a 1-time class. You will need to download the Webex app either on your phone or computer. We will send you a link the night before or the morning of the class. You should be able to click on that link to join the class. This is not a  confidential class. You don't have to turn your camera on, but other participants may be able to see your email address.  Scar massage You can begin gentle scar massage to you incision sites. Gently place one hand on the incision and move the skin (without sliding on the skin) in various directions. Do this for a few minutes and then you can gently massage either coconut oil or vitamin E cream into the scars.  Compression garment You should continue wearing your compression bra until you feel like  you no longer have swelling.  Home exercise Program Continue doing the exercises you were given until you feel like you can do them without feeling any tightness at the end.   Walking Program Studies show that 30 minutes of walking per day (fast enough to elevate your heart rate) can significantly reduce the risk of a cancer recurrence. If you can't walk due to other medical reasons, we encourage you to find another activity you could do (like a stationary bike or water exercise).  Posture After breast cancer surgery, people frequently sit with rounded shoulders posture because it puts their incisions on slack and feels better. If you sit like this and scar tissue forms in that position, you can become very tight and have pain sitting or standing with good posture. Try to be aware of your posture and sit and stand up tall to heal properly.  Follow up PT: It is recommended you return every 3 months for the first 3 years following surgery to be assessed on the SOZO machine for an L-Dex score. This helps prevent clinically significant lymphedema in 95% of patients. These follow up screens are 10 minute appointments that you are not billed for.  Claris Pong, PT 03/30/2023, 12:59 PM

## 2023-04-01 ENCOUNTER — Ambulatory Visit: Payer: 59 | Admitting: Plastic Surgery

## 2023-04-01 ENCOUNTER — Encounter: Payer: Self-pay | Admitting: Plastic Surgery

## 2023-04-01 ENCOUNTER — Ambulatory Visit: Payer: 59

## 2023-04-01 VITALS — BP 120/73 | HR 84

## 2023-04-01 DIAGNOSIS — L7634 Postprocedural seroma of skin and subcutaneous tissue following other procedure: Secondary | ICD-10-CM

## 2023-04-01 DIAGNOSIS — N6489 Other specified disorders of breast: Secondary | ICD-10-CM

## 2023-04-01 NOTE — Progress Notes (Signed)
Elizabeth Benton returns today for evaluation after removal of her bilateral tissue expanders.  Last week when she was seen she had a moderate to large seroma on the left side.  I aspirated a little over 100 mL of serous fluid.  Today she notes that it has become a little heavier and she thinks that she has reaccumulation of the serous fluid.  I aspirated the breast and did indeed get 50 mL more of serous fluid from the same pocket.  I believe that we should return to the operating room so I can remove any remaining scar tissue and place another drain for treatment of her chronic seroma.  I discussed this with her at length and she agrees.  Will submit a surgical request and schedule her surgery for the soonest possible.

## 2023-04-02 ENCOUNTER — Ambulatory Visit: Payer: 59

## 2023-04-02 DIAGNOSIS — C50411 Malignant neoplasm of upper-outer quadrant of right female breast: Secondary | ICD-10-CM | POA: Diagnosis not present

## 2023-04-02 DIAGNOSIS — R293 Abnormal posture: Secondary | ICD-10-CM

## 2023-04-02 DIAGNOSIS — Z170 Estrogen receptor positive status [ER+]: Secondary | ICD-10-CM

## 2023-04-02 DIAGNOSIS — M25611 Stiffness of right shoulder, not elsewhere classified: Secondary | ICD-10-CM

## 2023-04-02 DIAGNOSIS — M25612 Stiffness of left shoulder, not elsewhere classified: Secondary | ICD-10-CM

## 2023-04-02 DIAGNOSIS — R222 Localized swelling, mass and lump, trunk: Secondary | ICD-10-CM

## 2023-04-02 NOTE — Therapy (Signed)
OUTPATIENT PHYSICAL THERAPY BREAST CANCER TREATMENT   Patient Name: Elizabeth Benton MRN: [id number] DOB:July 09, 1946, 77 y.o., female Today's Date: 04/02/2023  END OF SESSION:  PT End of Session - 04/02/23 0756     Visit Number 20    Number of Visits 31    Date for PT Re-Evaluation 05/11/23    PT Start Time 0800    Activity Tolerance Patient tolerated treatment well    Behavior During Therapy [facility] for tasks assessed/performed              Past Medical History:  Diagnosis Date   Arthritis    Breast cancer    Depression    H/O migraine    HSV-2 (herpes simplex virus 2) infection    Hyperlipidemia    Hypertension    Lichen sclerosus    Migraine    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   Vaginal atrophy 12/29/2007   Vaginitis and vulvovaginitis 12/28/2004   Past Surgical History:  Procedure Laterality Date   ABDOMINAL HYSTERECTOMY  1987   BREAST RECONSTRUCTION WITH PLACEMENT OF TISSUE EXPANDER AND FLEX HD (ACELLULAR HYDRATED DERMIS) Bilateral 10/20/2022   Procedure: BREAST RECONSTRUCTION WITH PLACEMENT OF TISSUE EXPANDER AND FLEX HD (ACELLULAR HYDRATED DERMIS);  Surgeon: Janne Napoleon, MD;  Location: [facility] OR;  Service: Plastics;  Laterality: Bilateral;   DILATION AND CURETTAGE OF UTERUS     MASTECTOMY W/ SENTINEL NODE BIOPSY Right 10/20/2022   Procedure: RIGHT MASTECTOMY WITH RIGHT SENTINEL LYMPH NODE BIOPSY;  Surgeon: Almond Lint, MD;  Location: [facility] OR;  Service: General;  Laterality: Right;   REMOVAL OF BILATERAL TISSUE EXPANDERS WITH PLACEMENT OF BILATERAL BREAST IMPLANTS Bilateral 01/08/2023   Procedure: Removal of bilateral tissue expanders;  Surgeon: Santiago Glad, MD;  Location: [facility];  Service: Plastics;  Laterality: Bilateral;   TONSILLECTOMY     TOTAL MASTECTOMY Left 10/20/2022   Procedure: LEFT TOTAL MASTECTOMY;  Surgeon: Almond Lint, MD;  Location: [facility] OR;  Service: General;  Laterality: Left;   TUBAL LIGATION     WISDOM TOOTH EXTRACTION      Patient Active Problem List   Diagnosis Date Noted   S/P breast reconstruction 10/20/2022   Genetic testing 08/24/2022   Family history of breast cancer 08/06/2022   Family history of prostate cancer 08/06/2022   Malignant neoplasm of upper-outer quadrant of right breast in female, estrogen receptor positive 07/31/2022   Abnormal glucose level 12/05/2020   Constipation 12/05/2020   Lumbago with sciatica 12/05/2020   Osteopenia 12/05/2020   Overweight 12/05/2020   Screening for malignant neoplasm of colon 12/05/2020   Vitamin D deficiency 12/05/2020   Essential hypertension 11/30/2019   LBBB (left bundle branch block) 11/30/2019   Exertional dyspnea 11/30/2019   Lichen sclerosus 06/01/2012   Mixed hyperlipidemia    HSV-2 (herpes simplex virus 2) infection    Depression    H/O migraine    Monilia infection    Vaginal atrophy    Wears glasses 06/23/2011   Sinus problem/runny nose 06/23/2011   Mass on back 06/23/2011    PCP: Andi Devon, MD  REFERRING PROVIDER: Serena Croissant, MD  REFERRING DIAG: Right Breast Cancer  THERAPY DIAG:  Malignant neoplasm of upper-outer quadrant of right breast in female, estrogen receptor positive  Abnormal posture  Stiffness of left shoulder, not elsewhere classified  Stiffness of right shoulder, not elsewhere classified  Localized swelling of chest wall  Rationale for Evaluation and Treatment: Rehabilitation  ONSET DATE: 07/24/2022  SUBJECTIVE:  SUBJECTIVE STATEMENT I saw the Dr. Burgess Estelle and they drained another 50 cc from my left chest. The I and D is not scheduled yet. I feel a little better today. PERTINENT HISTORY:  Patient was diagnosed on 07/24/2022 with right grade 2 Invasive Lobular Carcinoma. It measures 1.2 cm and is located in the  upper-outer quadrant. It is ER 95%, PR 80% and HER 2 negative with a Ki67 of 40%. She is s/p a Bilateral Mastectomy on 10/20/2022  for Right Breast Cancer and with left prophylactic and bilateral tissue expanders .  She has since had the expanders removed  and bilateral subtotal capsulotomies on Jan. 12,2024. She had 90 cc  of fluid aspirated on 01/28/2023,and 50 cc of  fluid aspirated 02/08/2023 from the left breast area  PATIENT GOALS:  Reassess how my recovery is going related to arm function, pain, and swelling since expander removal and capsulectomies  PAIN:  Are you having pain?  PAIN:  Are you having pain? Yes NPRS scale: 2/10 discomfort left chest and  trunk region under armpit,  Pain location: left lateral trunk,  under arm pit on left Pain orientation: left PAIN TYPE: tight and bruising sort of,  Pain description: intermittent Aggravating factors: Relieving factors: compression, MLD, tumeric  PRECAUTIONS: Recent Surgery, right UE Lymphedema risk, DDD, LBP  ACTIVITY LEVEL / LEISURE: ,  not returned to work, goes back March 4   OBJECTIVE:   PATIENT SURVEYS:  QUICK DASH: Initial eval 61.36,  Re-eval  39%  OBSERVATIONS: Pt with larger right breast  than secondary to expander filled more. Mastectomy incisions are healed with glue still present. Left axillary region with area of swelling/tissue greater than right. Pt will be having an [place] today on the left. Having expanders removed in early January.  02/17/2023 Ballotable fluid noted left chest region, Right incision area irregular and folding inward. Left incision healed but also irregular without folding noted  POSTURE:  Forward head, rounded shoulders  LYMPHEDEMA ASSESSMENT:     UPPER EXTREMITY AROM/PROM:   A/PROM RIGHT   eval   Right 12/02/2022 RIGHT 12/29/2022 RIGHT 01/05/2023 Right Re-eval RIGHT 03/02/2023 Right 03/30/2023  Shoulder extension 60 49 63   57 tight axilla 60 59  Flexion      128 axilla 143 150  Shoulder  abduction 168 68 145 149 tight axilla 130 143 174  Shoulder internal rotation 55    60 70 70  Shoulder external rotation 112    98 109 110                          (Blank rows = not tested)   A/PROM LEFT   eval LEFT 2023 LEFT 12/29/2022 LEFT Re-eval LEFT 03/02/2023 LEFT 03/30/2023  Shoulder extension 58 46 65 52 tight 60 43  Shoulder flexion 159 120 160 155 tight chest 160 145  Shoulder abduction 165 90 156 123 tight axilla 158 118  Shoulder internal rotation 65   55 65 60  Shoulder external rotation 112   88 107 95                          (Blank rows = not tested)     CERVICAL AROM: All within functional  limits: Decreased 20 % Rotation and 50% SB         UPPER EXTREMITY STRENGTH: WNL     LYMPHEDEMA ASSESSMENTS:    LANDMARK RIGHT   eval Right  12/02/2022 RIGHT 02/17/2023  10 cm proximal to olecranon process 28.5 28.7 28.6  Olecranon process 24.6 24.6 24.6  10 cm proximal to ulnar styloid process 21.8 21.7 21.4  Just proximal to ulnar styloid process 16.4 16.3 16.4  Across hand at thumb web space 18.3 18.2 18.4  At base of 2nd digit 6.5 6.6 6.4  (Blank rows = not tested)   LANDMARK LEFT   eval LEFT 12/02/2022  10 cm proximal  29.3 29.7  Olecranon process  24.9 24.7  10 cm proximal to ulnar styloid process 21.0 21.4  Just proximal to ulnar styloid process 16.3 16.3  Across hand at thumb web space 18.0 17.9  At base of 2nd digit 6.45 6.4  (Blank rows = not tested)      Surgery type/Date: 10/20/2022 bilateral Mastectomy with right SLNB, 01/08/2023 Removal of bilateral tissue expanders and bilateral capsulectomies Number of lymph nodes removed: 0/2 right Current/past treatment (chemo, radiation, hormone therapy): No chemo required, will start anti-estrogen therapy Other symptoms:  Heaviness/tightness Yes Pain Yes Pitting edema No Infections No Decreased scar mobility Yes Stemmer sign No  TREATMENT TODAY  04/02/2023 Supine STM to left UT, Pecs, Lats and in SL to  UT and scapular area with cocoa butter PROM left shoulder going into  flexion, scaption and abduction with MFR at axilla to decrease soreness MLD to left chest area of swelling. Supraclavicular region, 5 diaphragmatic breaths, left inguinal LN's, left axillo inguinal pathway, left chest directing toward pathway multiple times  on left chest and ending with LN's.  MFR to bilateral pectoral and rib area   03/30/2023 Supine STM to left UT, Pecs, Lats and in SL to UT and scapular area with cocoa butter PROM left shoulder going slowly into scaption and abduction with MFR at axilla to decrease soreness. AROM bilateral flexion, scaption, horizontal abd x 5 Measured bilateral AROM shoulder Hold resistance exs per MD for 2 weeks 03/18/2023 Did pulleys at home Scapular retraction yellow x 10, shoulder extension x 10 yellow, bilateral ER x 10 yellow Supine STM to left UT, Pecs, Lats with cocoa butter MFR to left axillary cording with arm resting on pillow PROM left shoulder going slowly into scaption and abduction with MFR at axilla to decrease soreness. MLD to left chest area of swelling. Supraclavicular region, 5 diaphragmatic breaths, left inguinal LN's, left axillo inguinal pathway, left chest directing toward pathway multiple times avoiding darker tender region on left chest and ending with LN's.   03/16/2023  Pulleys x 1:30 flexion, scaption, abduction Standing lat stretch x 4, Wall slides on left with pt shown how to apply pressure with right hand to rib cage area x 5 NTS for left UE with wrist oscillation Instructed pt while performing MLD to left chest area of swelling. Supraclavicular region, 5 diaphragmatic breaths, left inguinal LN's, left axillo inguinal pathway, left chest directing toward pathway multiple times avoiding darker tender region on left chest and ending with LN's. Had pt perform all steps and gave written instructions. Advised pt to keep an eye on darker area of left chest ,  check for heat, pain to be sure it doesn't get larger and to avoid that area with MLD. Contact MD if painful or enlarges. PROM left shoulder with MFR to left axillary area of cording for flexion, scaption, abduction.  03/11/2023  Pulleys x 1:30 flexion, scaption, abd. To warm up Standing yellow theraband x 10 scapular retraction, extension, bilateral ER STM bilateral UT, pectorals, lats with cocoa butter PROM bilateral  shoulder flexion, scaption, abduction, ER with intermittent MFR at axilla for cording Standing counter stretch x 4, standing single arm chest stretch bilaterally   03/09/2023  Pulleys x 1:30 flex, scaption, Abd Standing jobes flexion  and scaption, abd x 10 STM bilateral UT, pectorals, lats with cocoa butter PROM bilateral shoulder flexion, scaption, abduction, ER with intermittent MFR at axilla for cording MFR bilateral axillary and upper arm cording with arm in D2 flexion  Supine scapular series x 10 with yellow except sword x 5  03/04/2023 Pulleys x 1:30 flex, scaption, Abd, Ball rolls flexion x 10, bilateral abd x 5 STM bilateral UT, pectorals, lats with cocoa butter PROM bilateral shoulder flexion, scaption, abduction, ER with intermittent MFR at axilla for cording MFR bilateral chest region bilaterally Supine scapular series x 10 including sword x 5 with yellow band   03/02/2023 STM bilateral UT, pectorals, lats with cocoa butter PROM bilateral shoulder flexion, scaption, abduction, ER with intermittent MFR at axilla for cording MFR bilateral chest region bilaterally Supine scapular series x 10 except sword with yellow band Updated HEP with band exercises Measured ROM 02/26/2023  STM bilateral UT, pectorals, lats with cocoa butter PROM bilateral shoulder flexion, scaption, abduction, ER with intermittent MFR at axilla for cording MFR bilateral chest region AROM supine bilateral Flexion, scaption, horizontal abduction x 5 Corner pec stretch and doorway pecs  stretch x 3-4 ea, SB stretch x 2 ea Updated HEP with stretches     PATIENT EDUCATION:   Access Code: [email or web address] URL: [email or web address] Date: 02/26/2023 Prepared by: Alvira Monday  Exercises - Supine Lower Trunk Rotation  - 2 x daily - 7 x weekly - 1 sets - 3-4 reps - 20 hold - Corner Pec Major Stretch  - 2 x daily - 7 x weekly - 1 sets - 3-4 reps - 20 hold - Single Arm Doorway Pec Stretch at 90 Degrees Abduction  - 2 x daily - 7 x weekly - 1 sets - 3-4 reps - 20 hold - TL Sidebending Stretch - Single Arm Overhead  - 2 x daily - 7 x weekly - 1 sets - 3-4 reps Education details: Reviewed 4 post op exercises. Did better with abduction sliding hand on wall Person educated: Patient Education method: Explanation and Demonstration Education comprehension: returned demonstration    HOME EXERCISE PROGRAM: Reviewed previously given post op HEP. Changed wall walk to wall slide and pt demonstrated good improvement Supine scapular series except sword x 10 with yellow ASSESSMENT:  CLINICAL IMPRESSION:  Pt is pending an I and D surgery to prevent having to have multiple aspirations in her left chest, but surgery date is not set yet. She always feels better after therapy. We will hold therapy until she gets her surgery, but she will continue her ROM exs at home. She will call to get appts as she feels she needs them if surgery is scheduled further out.  Pt will benefit from skilled therapeutic intervention to improve on the following deficits: Decreased knowledge of precautions, impaired UE functional use, pain, decreased ROM, postural dysfunction., edema   PT treatment/interventions: ADL/Self care home management, Therapeutic exercises, Therapeutic activity, Neuromuscular re-education, Patient/Family education, Self Care, Joint mobilization, Orthotic/Fit training, Dry Needling, Manual lymph drainage, scar mobilization, Manual therapy, and Re-evaluation   GOALS: Goals reviewed  with patient? Yes  LONG TERM GOALS:  (STG=LTG)  GOALS Name Target Date  Goal status  1 Pt will demonstrate she has regained full shoulder ROM and function post operatively compared to baselines.  Baseline: 01/13/2023 03/31/2023 In progress Left shoulder now most limited due to seroma and fluid collection left chest, Right nearly WNL  2 Pts quick dash will be improved to pre-surgery level of 36% 03/31/2023 In progress  3 Pt will be able to dress and bathe and perform light household chores independently 01/13/2023 MET 12/29/2022 Now slightly limited on left 03/30/2023  4 Pt will have decreased pain by 50% overall 01/13/2023 03/31/2023 MET 12/29/2022 Not met since new surgery MET for right side 03/29/2022 In progress for left due to seroma  5. Pt will resume usual home and work activities with minimal complaint 03/31/2023 MET on right 03/30/2023     PLAN:  PT FREQUENCY/DURATION:1- 2x/week x 6 weeks  PLAN FOR NEXT SESSION:  Pt on hold but will call when she finds out about her surgery.,Hold resistance per MD for 2 weeks but continue ROM, Left UE cording,Check swelling at  Left chest, Did she do MLD, get pulleys; YES,STM bilateral UQ, AAROM, PROM bilateral shoulders,  ABC strength handout,MLD prn especially to left  chest axillary region,   [facility]  [address], [address]  [place] [address]  [phone]  After Breast Cancer Class It is recommended you attend the ABC class to be educated on lymphedema risk reduction. This class is free of charge and lasts for 1 hour. It is a 1-time class. You will need to download the Webex app either on your phone or computer. We will send you a link the night before or the morning of the class. You should be able to click on that link to join the class. This is not a confidential class. You don't have to turn your camera on, but other participants may be able to see your email address.  Scar massage You can begin gentle scar massage  to you incision sites. Gently place one hand on the incision and move the skin (without sliding on the skin) in various directions. Do this for a few minutes and then you can gently massage either coconut oil or vitamin E cream into the scars.  Compression garment You should continue wearing your compression bra until you feel like you no longer have swelling.  Home exercise Program Continue doing the exercises you were given until you feel like you can do them without feeling any tightness at the end.   Walking Program Studies show that 30 minutes of walking per day (fast enough to elevate your heart rate) can significantly reduce the risk of a cancer recurrence. If you can't walk due to other medical reasons, we encourage you to find another activity you could do (like a stationary bike or water exercise).  Posture After breast cancer surgery, people frequently sit with rounded shoulders posture because it puts their incisions on slack and feels better. If you sit like this and scar tissue forms in that position, you can become very tight and have pain sitting or standing with good posture. Try to be aware of your posture and sit and stand up tall to heal properly.  Follow up PT: It is recommended you return every 3 months for the first 3 years following surgery to be assessed on the SOZO machine for an L-Dex score. This helps prevent clinically significant lymphedema in 95% of patients. These follow up screens are 10 minute appointments that you are not billed for.  Waynette Butteryobin J Ananth Fiallos, PT 04/02/2023, 7:58 AM

## 2023-04-05 ENCOUNTER — Telehealth: Payer: Self-pay | Admitting: *Deleted

## 2023-04-05 NOTE — Telephone Encounter (Signed)
Spoke with patient, let her know I was just waiting to verify the correct CPT codes and then I would be in touch once auth was in place IF required.

## 2023-04-07 ENCOUNTER — Telehealth: Payer: Self-pay | Admitting: *Deleted

## 2023-04-07 NOTE — Telephone Encounter (Signed)
No auth req, spoke with patient to schedule surgery and related appts

## 2023-04-08 ENCOUNTER — Ambulatory Visit (INDEPENDENT_AMBULATORY_CARE_PROVIDER_SITE_OTHER): Payer: 59 | Admitting: Student

## 2023-04-08 VITALS — BP 133/82 | HR 94

## 2023-04-08 DIAGNOSIS — Z9889 Other specified postprocedural states: Secondary | ICD-10-CM

## 2023-04-08 DIAGNOSIS — N6489 Other specified disorders of breast: Secondary | ICD-10-CM

## 2023-04-08 MED ORDER — ONDANSETRON HCL 4 MG PO TABS: 4.0000 mg | ORAL_TABLET | Freq: Three times a day (TID) | ORAL | 0 refills | Status: DC | PRN

## 2023-04-08 MED ORDER — HYDROCODONE-ACETAMINOPHEN 5-325 MG PO TABS: 1.0000 | ORAL_TABLET | Freq: Four times a day (QID) | ORAL | 0 refills | Status: DC | PRN

## 2023-04-08 NOTE — H&P (View-Only) (Signed)
   Patient ID: Elizabeth Benton, female    DOB: 12/22/1946, 76 y.o.   MRN: [id number]  Chief Complaint  Patient presents with   Pre-op Exam      ICD-10-CM   1. Seroma of breast  N64.89     2. S/P breast reconstruction  Z98.890        History of Present Illness: Elizabeth Benton is a 76 y.o.  female  with a history of breast cancer.  She underwent bilateral mastectomies and placement of tissue expanders back in October 2023.  Patient later requested that the expanders be removed.  She underwent the removal of the expanders on 01/08/2023.  She presents for preoperative evaluation for upcoming procedure, debridement of seroma cavity and placement of a drain to the left breast, scheduled for 04/16/2023 with Dr. Taylor.  The patient has not had problems with anesthesia.  Patient denies any cardiac disease.  She denies taking any blood thinners.  Patient states that she is not a smoker.  Patient denies taking any birth control or hormone replacement.  She denies any personal or family history of blood clots or clotting diseases.  She denies any recent surgeries, traumas, infections or hospitalizations.  She denies any history of stroke or heart attack.  Patient reports some aching in her joints, which she attributes to the letrozole.  I recommended she follow-up with her oncologist or PCP in regards to this.  Patient expressed understanding.  Patient denies any other recent fevers, chills or changes in her health.  Patient does report that she is going to fly to [place] about 5 days postop.  She states that the flight to [place] is less than an hour.  I discussed with the patient that if she travels, she should make sure that she is ambulating often and that she wears compression socks as she may be at an increased risk of developing a clot that soon after surgery.  Patient expressed understanding.  Summary of Previous Visit: Patient was last seen by Dr. Taylor on 04/01/2023.  Prior to this visit,  patient had several seromas aspirated in the office without difficulty.  At this visit, patient reported that she felt there was another reaccumulation of the serous fluid.  Another 50 cc of serous fluid was aspirated from the same pocket.  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  PMH Significant for: Hypertension, breast cancer   Past Medical History: Allergies: Allergies  Allergen Reactions   Contrast Media [Iodinated Contrast Media] Anaphylaxis   Demerol Other (See Comments)    Patient stated she will pass out, unaware of surroundings. Last time she received it (for surgery) her stomach had to be pumped.   Iodine Anaphylaxis   Anastrozole Itching    Current Medications:  Current Outpatient Medications:    acetaminophen (TYLENOL) 650 MG CR tablet, Take 650 mg by mouth every 8 (eight) hours as needed for pain., Disp: , Rfl:    ALPRAZolam (XANAX) 0.25 MG tablet, Take 0.25-0.5 mg by mouth every 8 (eight) hours as needed for anxiety., Disp: , Rfl:    amLODipine (NORVASC) 5 MG tablet, Take 1.5 tablets (7.5 mg total) by mouth daily. (Patient taking differently: Take 5 mg by mouth daily.), Disp: 120 tablet, Rfl: 1   betamethasone dipropionate 0.05 % cream, Apply   1 Application topically 2 (two) times daily as needed for rash., Disp: , Rfl:    Carboxymethylcellul-Glycerin (LUBRICATING EYE DROPS OP), Place 1 drop into both eyes daily as needed (dry eyes)., Disp: , Rfl:    gabapentin (NEURONTIN) 100 MG capsule, Take 1 capsule (100 mg total) by mouth 2 (two) times daily., Disp: 60 capsule, Rfl: 0   letrozole (FEMARA) 2.5 MG tablet, TAKE 1 TABLET BY MOUTH EVERY DAY, Disp: 90 tablet, Rfl: 1   methocarbamol (ROBAXIN) 500 MG tablet, Take 1 tablet (500 mg total) by mouth every 8 (eight) hours as needed for up to 15 doses for  muscle spasms., Disp: 15 tablet, Rfl: 0   rosuvastatin (CRESTOR) 20 MG tablet, TAKE 1 TABLET BY MOUTH EVERY DAY, Disp: 90 tablet, Rfl: 3   senna (SENOKOT) 8.6 MG TABS tablet, Take 1 tablet (8.6 mg total) by mouth 2 (two) times daily., Disp: 120 tablet, Rfl: 0   TURMERIC PO, Take by mouth., Disp: , Rfl:   Past Medical Problems: Past Medical History:  Diagnosis Date   Arthritis    Breast cancer    Depression    H/O migraine    HSV-2 (herpes simplex virus 2) infection    Hyperlipidemia    Hypertension    Lichen sclerosus    Migraine    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   Vaginal atrophy 12/29/2007   Vaginitis and vulvovaginitis 12/28/2004    Past Surgical History: Past Surgical History:  Procedure Laterality Date   ABDOMINAL HYSTERECTOMY  1987   BREAST RECONSTRUCTION WITH PLACEMENT OF TISSUE EXPANDER AND FLEX HD (ACELLULAR HYDRATED DERMIS) Bilateral 10/20/2022   Procedure: BREAST RECONSTRUCTION WITH PLACEMENT OF TISSUE EXPANDER AND FLEX HD (ACELLULAR HYDRATED DERMIS);  Surgeon: Luppens, Daniel, MD;  Location: [facility] OR;  Service: Plastics;  Laterality: Bilateral;   DILATION AND CURETTAGE OF UTERUS     MASTECTOMY W/ SENTINEL NODE BIOPSY Right 10/20/2022   Procedure: RIGHT MASTECTOMY WITH RIGHT SENTINEL LYMPH NODE BIOPSY;  Surgeon: Byerly, Faera, MD;  Location: [facility] OR;  Service: General;  Laterality: Right;   REMOVAL OF BILATERAL TISSUE EXPANDERS WITH PLACEMENT OF BILATERAL BREAST IMPLANTS Bilateral 01/08/2023   Procedure: Removal of bilateral tissue expanders;  Surgeon: Taylor, Jackson B, MD;  Location: [facility];  Service: Plastics;  Laterality: Bilateral;   TONSILLECTOMY     TOTAL MASTECTOMY Left 10/20/2022   Procedure: LEFT TOTAL MASTECTOMY;  Surgeon: Byerly, Faera, MD;  Location: [facility] OR;  Service: General;  Laterality: Left;   TUBAL LIGATION     WISDOM TOOTH EXTRACTION      Social History: Social History   Socioeconomic History   Marital status: Married    Spouse  name: Julius Camilo   Number of children: 2   Years of education: Not on file   Highest education level: Not on file  Occupational History   Not on file  Tobacco Use   Smoking status: Former    Packs/day: 0.50    Years: 40.00    Additional pack years: 0.00    Total pack years: 20.00    Types: Cigarettes    Quit date: 01/22/1997    Years since quitting: 26.2   Smokeless tobacco: Never  Vaping Use   Vaping Use: Never used  Substance and Sexual Activity   Alcohol use: Yes    Alcohol/week: 1.0 standard drink of alcohol    Types: 1 Standard drinks or equivalent per week    Comment: occ   Drug use: No   Sexual activity: Not   Currently    Birth control/protection: Post-menopausal    Comment: hyst  Other Topics Concern   Not on file  Social History Narrative   Not on file   Social Determinants of Health   Financial Resource Strain: Not on file  Food Insecurity: Not on file  Transportation Needs: Not on file  Physical Activity: Not on file  Stress: Not on file  Social Connections: Not on file  Intimate Partner Violence: Not on file    Family History: Family History  Problem Relation Age of Onset   Stroke Mother    Throat cancer Father 70   Prostate cancer Brother 72   Breast cancer Maternal Aunt 65   Prostate cancer Maternal Uncle    Prostate cancer Maternal Uncle    Prostate cancer Maternal Uncle    Pancreatic cancer Paternal Uncle    Breast cancer Cousin        4 maternal first cousins   Prostate cancer Cousin    Breast cancer Other        maternal first cousin once removed   Other Other        BRCA+    Review of Systems: Denies any fevers or chills  Physical Exam: Vital Signs BP 133/82 (BP Location: Left Arm, Patient Position: Sitting, Cuff Size: Small)   Pulse 94   SpO2 98%   Physical Exam  Constitutional:      General: Not in acute distress.    Appearance: Normal appearance. Not ill-appearing.  HENT:     Head: Normocephalic and atraumatic.  Neck:      Musculoskeletal: Normal range of motion.  Cardiovascular:     Rate and Rhythm: Normal rate Pulmonary:     Effort: Pulmonary effort is normal. No respiratory distress.  Musculoskeletal: Normal range of motion.  Skin:    General: Skin is warm and dry.     Findings: No erythema or rash.  Neurological:     Mental Status: Alert and oriented to person, place, and time. Mental status is at baseline.  Psychiatric:        Mood and Affect: Mood normal.        Behavior: Behavior normal.    Assessment/Plan: The patient is scheduled for debridement of seroma cavity and drain placement to the left breast with Dr. Taylor.  Risks, benefits, and alternatives of procedure discussed, questions answered and consent obtained.    Smoking Status: Non-smoker; Counseling Given?  N/A Last Mammogram: Status post bilateral mastectomies  Caprini Score: 7; Risk Factors include: Age, history of malignancy, and length of planned surgery. Recommendation for mechanical prophylaxis. Encourage early ambulation.  I did discuss the possibility of postoperative Lovenox with Dr. Taylor.  We will hold off on postoperative Lovenox.  I encouraged early ambulation with the patient and recommended that she hydrates adequately.  Patient expressed understanding.  Pictures obtained: Today  Post-op Rx sent to pharmacy: Norco, zofran  -patient states that she did well with Norco in the past.  I instructed the patient to hold her Robaxin and Xanax the morning of surgery.  I also instructed the patient to hold any multivitamins or supplements 1 week prior to surgery.  Patient expressed understanding.  Patient states she is no longer taking gabapentin.  Patient was provided with the General Surgical Risk consent document and Pain Medication Agreement prior to their appointment.  They had adequate time to read through the risk consent documents and Pain Medication Agreement. We also discussed them in person together during this preop    appointment. All of their questions were answered to their satisfaction.  Recommended calling if they have any further questions.  Risk consent form and Pain Medication Agreement to be scanned into patient's chart.  The consent was obtained with risks and complications reviewed which included bleeding, pain, scar, infection and the risk of anesthesia.  The patients questions were answered to the patients expressed satisfaction.    Electronically signed by: Shanay Woolman E Torre [place], PA-C 04/08/2023 9:19 AM  

## 2023-04-08 NOTE — Progress Notes (Signed)
Patient ID: Clearance [name], female    DOB: [birth year], 77 y.o.   MRN: [id number]  Chief Complaint  Patient presents with   Pre-op Exam      ICD-10-CM   1. Seroma of breast  N64.89     2. S/P breast reconstruction  Z98.890        History of Present Illness: Elizabeth Benton is a 77 y.o.  female  with a history of breast cancer.  She underwent bilateral mastectomies and placement of tissue expanders back in October 2023.  Patient later requested that the expanders be removed.  She underwent the removal of the expanders on 01/08/2023.  She presents for preoperative evaluation for upcoming procedure, debridement of seroma cavity and placement of a drain to the left breast, scheduled for 04/16/2023 with Dr. Ladona Ridgel.  The patient has not had problems with anesthesia.  Patient denies any cardiac disease.  She denies taking any blood thinners.  Patient states that she is not a smoker.  Patient denies taking any birth control or hormone replacement.  She denies any personal or family history of blood clots or clotting diseases.  She denies any recent surgeries, traumas, infections or hospitalizations.  She denies any history of stroke or heart attack.  Patient reports some aching in her joints, which she attributes to the letrozole.  I recommended she follow-up with her oncologist or PCP in regards to this.  Patient expressed understanding.  Patient denies any other recent fevers, chills or changes in her health.  Patient does report that she is going to fly to [facility] about 5 days postop.  She states that the flight to [facility] is less than an hour.  I discussed with the patient that if she travels, she should make sure that she is ambulating often and that she wears compression socks as she may be at an increased risk of developing a clot that soon after surgery.  Patient expressed understanding.  Summary of Previous Visit: Patient was last seen by Dr. Ladona Ridgel on 04/01/2023.  Prior to this visit,  patient had several seromas aspirated in the office without difficulty.  At this visit, patient reported that she felt there was another reaccumulation of the serous fluid.  Another 50 cc of serous fluid was aspirated from the same pocket.  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  Job: Patient works at [employer], planning to take 2 weeks off after surgery.  PMH Significant for: Hypertension, breast cancer   Past Medical History: Allergies: Allergies  Allergen Reactions   Contrast Media [Iodinated Contrast Media] Anaphylaxis   Demerol Other (See Comments)    Patient stated she will pass out, unaware of surroundings. Last time she received it (for surgery) her stomach had to be pumped.   Iodine Anaphylaxis   Anastrozole Itching    Current Medications:  Current Outpatient Medications:    acetaminophen (TYLENOL) 650 MG CR tablet, Take 650 mg by mouth every 8 (eight) hours as needed for pain., Disp: , Rfl:    ALPRAZolam (XANAX) 0.25 MG tablet, Take 0.25-0.5 mg by mouth every 8 (eight) hours as needed for anxiety., Disp: , Rfl:    amLODipine (NORVASC) 5 MG tablet, Take 1.5 tablets (7.5 mg total) by mouth daily. (Patient taking differently: Take 5 mg by mouth daily.), Disp: 120 tablet, Rfl: 1   betamethasone dipropionate 0.05 % cream, Apply  1 Application topically 2 (two) times daily as needed for rash., Disp: , Rfl:    Carboxymethylcellul-Glycerin (LUBRICATING EYE DROPS OP), Place 1 drop into both eyes daily as needed (dry eyes)., Disp: , Rfl:    gabapentin (NEURONTIN) 100 MG capsule, Take 1 capsule (100 mg total) by mouth 2 (two) times daily., Disp: 60 capsule, Rfl: 0   letrozole (FEMARA) 2.5 MG tablet, TAKE 1 TABLET BY MOUTH EVERY DAY, Disp: 90 tablet, Rfl: 1   methocarbamol (ROBAXIN) 500 MG tablet, Take 1 tablet (500 mg total) by mouth every 8 (eight) hours as needed for up to 15 doses for  muscle spasms., Disp: 15 tablet, Rfl: 0   rosuvastatin (CRESTOR) 20 MG tablet, TAKE 1 TABLET BY MOUTH EVERY DAY, Disp: 90 tablet, Rfl: 3   senna (SENOKOT) 8.6 MG TABS tablet, Take 1 tablet (8.6 mg total) by mouth 2 (two) times daily., Disp: 120 tablet, Rfl: 0   TURMERIC PO, Take by mouth., Disp: , Rfl:   Past Medical Problems: Past Medical History:  Diagnosis Date   Arthritis    Breast cancer    Depression    H/O migraine    HSV-2 (herpes simplex virus 2) infection    Hyperlipidemia    Hypertension    Lichen sclerosus    Migraine    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   Vaginal atrophy 12/29/2007   Vaginitis and vulvovaginitis 12/28/2004    Past Surgical History: Past Surgical History:  Procedure Laterality Date   ABDOMINAL HYSTERECTOMY  1987   BREAST RECONSTRUCTION WITH PLACEMENT OF TISSUE EXPANDER AND FLEX HD (ACELLULAR HYDRATED DERMIS) Bilateral 10/20/2022   Procedure: BREAST RECONSTRUCTION WITH PLACEMENT OF TISSUE EXPANDER AND FLEX HD (ACELLULAR HYDRATED DERMIS);  Surgeon: Janne Napoleon, MD;  Location: [facility] OR;  Service: Plastics;  Laterality: Bilateral;   DILATION AND CURETTAGE OF UTERUS     MASTECTOMY W/ SENTINEL NODE BIOPSY Right 10/20/2022   Procedure: RIGHT MASTECTOMY WITH RIGHT SENTINEL LYMPH NODE BIOPSY;  Surgeon: Almond Lint, MD;  Location: [facility] OR;  Service: General;  Laterality: Right;   REMOVAL OF BILATERAL TISSUE EXPANDERS WITH PLACEMENT OF BILATERAL BREAST IMPLANTS Bilateral 01/08/2023   Procedure: Removal of bilateral tissue expanders;  Surgeon: Santiago Glad, MD;  Location: [facility];  Service: Plastics;  Laterality: Bilateral;   TONSILLECTOMY     TOTAL MASTECTOMY Left 10/20/2022   Procedure: LEFT TOTAL MASTECTOMY;  Surgeon: Almond Lint, MD;  Location: [facility] OR;  Service: General;  Laterality: Left;   TUBAL LIGATION     WISDOM TOOTH EXTRACTION      Social History: Social History   Socioeconomic History   Marital status: Married    Spouse  name: Tirzah Lebleu   Number of children: 2   Years of education: Not on file   Highest education level: Not on file  Occupational History   Not on file  Tobacco Use   Smoking status: Former    Packs/day: 0.50    Years: 40.00    Additional pack years: 0.00    Total pack years: 20.00    Types: Cigarettes    Quit date: 01/22/1997    Years since quitting: 26.2   Smokeless tobacco: Never  Vaping Use   Vaping Use: Never used  Substance and Sexual Activity   Alcohol use: Yes    Alcohol/week: 1.0 standard drink of alcohol    Types: 1 Standard drinks or equivalent per week    Comment: occ   Drug use: No   Sexual activity: Not  Currently    Birth control/protection: Post-menopausal    Comment: hyst  Other Topics Concern   Not on file  Social History Narrative   Not on file   Social Determinants of Health   Financial Resource Strain: Not on file  Food Insecurity: Not on file  Transportation Needs: Not on file  Physical Activity: Not on file  Stress: Not on file  Social Connections: Not on file  Intimate Partner Violence: Not on file    Family History: Family History  Problem Relation Age of Onset   Stroke Mother    Throat cancer Father [phone]   Prostate cancer Brother [phone]   Breast cancer Maternal Aunt 65   Prostate cancer Maternal Uncle    Prostate cancer Maternal Uncle    Prostate cancer Maternal Uncle    Pancreatic cancer Paternal Uncle    Breast cancer Cousin        4 maternal first cousins   Prostate cancer Cousin    Breast cancer Other        maternal first cousin once removed   Other Other        BRCA+    Review of Systems: Denies any fevers or chills  Physical Exam: Vital Signs BP 133/82 (BP Location: Left Arm, Patient Position: Sitting, Cuff Size: Small)   Pulse 94   SpO2 98%   Physical Exam  Constitutional:      General: Not in acute distress.    Appearance: Normal appearance. Not ill-appearing.  HENT:     Head: Normocephalic and atraumatic.  Neck:      Musculoskeletal: Normal range of motion.  Cardiovascular:     Rate and Rhythm: Normal rate Pulmonary:     Effort: Pulmonary effort is normal. No respiratory distress.  Musculoskeletal: Normal range of motion.  Skin:    General: Skin is warm and dry.     Findings: No erythema or rash.  Neurological:     Mental Status: Alert and oriented to person, place, and time. Mental status is at baseline.  Psychiatric:        Mood and Affect: Mood normal.        Behavior: Behavior normal.    Assessment/Plan: The patient is scheduled for debridement of seroma cavity and drain placement to the left breast with Dr. Ladona Ridgelaylor.  Risks, benefits, and alternatives of procedure discussed, questions answered and consent obtained.    Smoking Status: Non-smoker; Counseling Given?  N/A Last Mammogram: Status post bilateral mastectomies  Caprini Score: 7; Risk Factors include: Age, history of malignancy, and length of planned surgery. Recommendation for mechanical prophylaxis. Encourage early ambulation.  I did discuss the possibility of postoperative Lovenox with Dr. Ladona Ridgelaylor.  We will hold off on postoperative Lovenox.  I encouraged early ambulation with the patient and recommended that she hydrates adequately.  Patient expressed understanding.  Pictures obtained: Today  Post-op Rx sent to pharmacy: Norco, zofran  -patient states that she did well with Norco in the past.  I instructed the patient to hold her Robaxin and Xanax the morning of surgery.  I also instructed the patient to hold any multivitamins or supplements 1 week prior to surgery.  Patient expressed understanding.  Patient states she is no longer taking gabapentin.  Patient was provided with the General Surgical Risk consent document and Pain Medication Agreement prior to their appointment.  They had adequate time to read through the risk consent documents and Pain Medication Agreement. We also discussed them in person together during this preop  appointment. All of their questions were answered to their satisfaction.  Recommended calling if they have any further questions.  Risk consent form and Pain Medication Agreement to be scanned into patient's chart.  The consent was obtained with risks and complications reviewed which included bleeding, pain, scar, infection and the risk of anesthesia.  The patients questions were answered to the patients expressed satisfaction.    Electronically signed by: Laurena Spies, PA-C 04/08/2023 9:19 AM

## 2023-04-09 ENCOUNTER — Other Ambulatory Visit: Payer: Self-pay

## 2023-04-09 ENCOUNTER — Encounter (HOSPITAL_BASED_OUTPATIENT_CLINIC_OR_DEPARTMENT_OTHER): Payer: Self-pay | Admitting: Plastic Surgery

## 2023-04-16 ENCOUNTER — Ambulatory Visit (HOSPITAL_BASED_OUTPATIENT_CLINIC_OR_DEPARTMENT_OTHER): Payer: 59 | Admitting: Anesthesiology

## 2023-04-16 ENCOUNTER — Encounter (HOSPITAL_BASED_OUTPATIENT_CLINIC_OR_DEPARTMENT_OTHER)
Admission: RE | Disposition: A | Discharge status: Home / Self Care | Payer: Self-pay | Source: Home / Self Care | Attending: Plastic Surgery

## 2023-04-16 ENCOUNTER — Other Ambulatory Visit: Payer: Self-pay

## 2023-04-16 ENCOUNTER — Encounter (HOSPITAL_BASED_OUTPATIENT_CLINIC_OR_DEPARTMENT_OTHER): Payer: Self-pay | Admitting: Plastic Surgery

## 2023-04-16 ENCOUNTER — Ambulatory Visit (HOSPITAL_BASED_OUTPATIENT_CLINIC_OR_DEPARTMENT_OTHER)
Admission: RE | Admit: 2023-04-16 | Discharge: 2023-04-16 | Disposition: A | Discharge status: Home / Self Care | Payer: 59 | Attending: Plastic Surgery | Admitting: Plastic Surgery

## 2023-04-16 DIAGNOSIS — L7634 Postprocedural seroma of skin and subcutaneous tissue following other procedure: Secondary | ICD-10-CM | POA: Diagnosis not present

## 2023-04-16 DIAGNOSIS — I10 Essential (primary) hypertension: Secondary | ICD-10-CM | POA: Insufficient documentation

## 2023-04-16 DIAGNOSIS — M96843 Postprocedural seroma of a musculoskeletal structure following other procedure: Secondary | ICD-10-CM | POA: Insufficient documentation

## 2023-04-16 DIAGNOSIS — Z87891 Personal history of nicotine dependence: Secondary | ICD-10-CM | POA: Insufficient documentation

## 2023-04-16 DIAGNOSIS — Z853 Personal history of malignant neoplasm of breast: Secondary | ICD-10-CM | POA: Diagnosis not present

## 2023-04-16 HISTORY — PX: IRRIGATION AND DEBRIDEMENT OF WOUND WITH SPLIT THICKNESS SKIN GRAFT: SHX5879

## 2023-04-16 SURGERY — IRRIGATION AND DEBRIDEMENT OF WOUND WITH SPLIT THICKNESS SKIN GRAFT
Anesthesia: General | Site: Breast | Laterality: Left

## 2023-04-16 MED ORDER — MIDAZOLAM HCL 2 MG/2ML IJ SOLN
INTRAMUSCULAR | Status: AC
  Filled 2023-04-16: qty 2

## 2023-04-16 MED ORDER — FENTANYL CITRATE (PF) 100 MCG/2ML IJ SOLN
INTRAMUSCULAR | Status: AC
  Filled 2023-04-16: qty 2

## 2023-04-16 MED ORDER — CHLORHEXIDINE GLUCONATE CLOTH 2 % EX PADS: 6.0000 | MEDICATED_PAD | Freq: Once | CUTANEOUS | Status: DC

## 2023-04-16 MED ORDER — LACTATED RINGERS IV SOLN: INTRAVENOUS | Status: DC

## 2023-04-16 MED ORDER — ONDANSETRON HCL 4 MG/2ML IJ SOLN
INTRAMUSCULAR | Status: DC | PRN
  Administered 2023-04-16: 4 mg via INTRAVENOUS

## 2023-04-16 MED ORDER — PHENYLEPHRINE 80 MCG/ML (10ML) SYRINGE FOR IV PUSH (FOR BLOOD PRESSURE SUPPORT)
PREFILLED_SYRINGE | INTRAVENOUS | Status: AC
  Filled 2023-04-16: qty 10

## 2023-04-16 MED ORDER — LIDOCAINE 2% (20 MG/ML) 5 ML SYRINGE
INTRAMUSCULAR | Status: AC
  Filled 2023-04-16: qty 5

## 2023-04-16 MED ORDER — DEXAMETHASONE SODIUM PHOSPHATE 10 MG/ML IJ SOLN
INTRAMUSCULAR | Status: AC
  Filled 2023-04-16: qty 1

## 2023-04-16 MED ORDER — ONDANSETRON HCL 4 MG/2ML IJ SOLN
INTRAMUSCULAR | Status: AC
  Filled 2023-04-16: qty 2

## 2023-04-16 MED ORDER — FENTANYL CITRATE (PF) 100 MCG/2ML IJ SOLN
25.0000 ug | INTRAMUSCULAR | Status: DC | PRN
  Administered 2023-04-16 (×2): 50 ug via INTRAVENOUS

## 2023-04-16 MED ORDER — EPHEDRINE 5 MG/ML INJ
INTRAVENOUS | Status: AC
  Filled 2023-04-16: qty 5

## 2023-04-16 MED ORDER — MIDAZOLAM HCL 5 MG/5ML IJ SOLN
INTRAMUSCULAR | Status: DC | PRN
  Administered 2023-04-16: 1 mg via INTRAVENOUS

## 2023-04-16 MED ORDER — ONDANSETRON HCL 4 MG/2ML IJ SOLN
4.0000 mg | Freq: Once | INTRAMUSCULAR | Status: AC | PRN
  Administered 2023-04-16: 4 mg via INTRAVENOUS

## 2023-04-16 MED ORDER — OXYCODONE HCL 5 MG/5ML PO SOLN: 5.0000 mg | Freq: Once | ORAL | Status: AC | PRN

## 2023-04-16 MED ORDER — 0.9 % SODIUM CHLORIDE (POUR BTL) OPTIME
TOPICAL | Status: DC | PRN
  Administered 2023-04-16: 1000 mL

## 2023-04-16 MED ORDER — ACETAMINOPHEN 10 MG/ML IV SOLN
1000.0000 mg | Freq: Once | INTRAVENOUS | Status: DC | PRN
  Administered 2023-04-16: 1000 mg via INTRAVENOUS

## 2023-04-16 MED ORDER — OXYCODONE HCL 5 MG PO TABS
5.0000 mg | ORAL_TABLET | Freq: Once | ORAL | Status: AC | PRN
  Administered 2023-04-16: 5 mg via ORAL

## 2023-04-16 MED ORDER — EPHEDRINE SULFATE (PRESSORS) 50 MG/ML IJ SOLN
INTRAMUSCULAR | Status: DC | PRN
  Administered 2023-04-16: 10 mg via INTRAVENOUS

## 2023-04-16 MED ORDER — PROPOFOL 10 MG/ML IV BOLUS
INTRAVENOUS | Status: DC | PRN
  Administered 2023-04-16: 150 mg via INTRAVENOUS

## 2023-04-16 MED ORDER — OXYCODONE HCL 5 MG PO TABS
ORAL_TABLET | ORAL | Status: AC
  Filled 2023-04-16: qty 1

## 2023-04-16 MED ORDER — CEFAZOLIN SODIUM-DEXTROSE 2-4 GM/100ML-% IV SOLN
INTRAVENOUS | Status: AC
  Filled 2023-04-16: qty 100

## 2023-04-16 MED ORDER — DEXAMETHASONE SODIUM PHOSPHATE 4 MG/ML IJ SOLN
INTRAMUSCULAR | Status: DC | PRN
  Administered 2023-04-16: 4 mg via INTRAVENOUS

## 2023-04-16 MED ORDER — FENTANYL CITRATE (PF) 100 MCG/2ML IJ SOLN
INTRAMUSCULAR | Status: DC | PRN
  Administered 2023-04-16: 50 ug via INTRAVENOUS

## 2023-04-16 MED ORDER — ACETAMINOPHEN 10 MG/ML IV SOLN
INTRAVENOUS | Status: AC
  Filled 2023-04-16: qty 100

## 2023-04-16 MED ORDER — CEFAZOLIN SODIUM-DEXTROSE 2-4 GM/100ML-% IV SOLN
2.0000 g | INTRAVENOUS | Status: AC
  Administered 2023-04-16: 2 g via INTRAVENOUS

## 2023-04-16 MED ORDER — LIDOCAINE HCL (CARDIAC) PF 100 MG/5ML IV SOSY
PREFILLED_SYRINGE | INTRAVENOUS | Status: DC | PRN
  Administered 2023-04-16: 100 mg via INTRAVENOUS

## 2023-04-16 SURGICAL SUPPLY — 60 items
ADH SKN CLS APL DERMABOND .7 (GAUZE/BANDAGES/DRESSINGS) ×1
APL PRP STRL LF DISP 70% ISPRP (MISCELLANEOUS) ×1
BAG DECANTER FOR FLEXI CONT (MISCELLANEOUS) IMPLANT
BINDER BREAST 3XL (GAUZE/BANDAGES/DRESSINGS) IMPLANT
BINDER BREAST LRG (GAUZE/BANDAGES/DRESSINGS) IMPLANT
BINDER BREAST MEDIUM (GAUZE/BANDAGES/DRESSINGS) IMPLANT
BINDER BREAST XLRG (GAUZE/BANDAGES/DRESSINGS) IMPLANT
BINDER BREAST XXLRG (GAUZE/BANDAGES/DRESSINGS) IMPLANT
BIOPATCH RED 1 DISK 7.0 (GAUZE/BANDAGES/DRESSINGS) IMPLANT
BLADE SURG 10 STRL SS (BLADE) ×4 IMPLANT
BLADE SURG 15 STRL LF DISP TIS (BLADE) ×1 IMPLANT
BLADE SURG 15 STRL SS (BLADE) ×1
CANISTER SUCT 1200ML W/VALVE (MISCELLANEOUS) ×1 IMPLANT
CHLORAPREP W/TINT 26 (MISCELLANEOUS) ×1 IMPLANT
DERMABOND ADVANCED .7 DNX12 (GAUZE/BANDAGES/DRESSINGS) ×2 IMPLANT
DRAIN CHANNEL 19F RND (DRAIN) ×1 IMPLANT
DRAPE UTILITY XL STRL (DRAPES) IMPLANT
DRSG MEPILEX POST OP 4X8 (GAUZE/BANDAGES/DRESSINGS) ×2 IMPLANT
DRSG TEGADERM 4X4.75 (GAUZE/BANDAGES/DRESSINGS) IMPLANT
ELECT BLADE 4.0 EZ CLEAN MEGAD (MISCELLANEOUS) ×1
ELECT REM PT RETURN 9FT ADLT (ELECTROSURGICAL) ×1
ELECTRODE BLDE 4.0 EZ CLN MEGD (MISCELLANEOUS) ×1 IMPLANT
ELECTRODE REM PT RTRN 9FT ADLT (ELECTROSURGICAL) ×1 IMPLANT
EVACUATOR SILICONE 100CC (DRAIN) ×1 IMPLANT
GAUZE PAD ABD 8X10 STRL (GAUZE/BANDAGES/DRESSINGS) ×2 IMPLANT
GLOVE BIO SURGEON STRL SZ 6.5 (GLOVE) IMPLANT
GLOVE BIO SURGEON STRL SZ7.5 (GLOVE) ×1 IMPLANT
GLOVE BIO SURGEON STRL SZ8 (GLOVE) ×1 IMPLANT
GLOVE BIOGEL PI IND STRL 7.0 (GLOVE) IMPLANT
GLOVE BIOGEL PI IND STRL 8 (GLOVE) ×1 IMPLANT
GOWN STRL REUS W/ TWL LRG LVL3 (GOWN DISPOSABLE) IMPLANT
GOWN STRL REUS W/TWL LRG LVL3 (GOWN DISPOSABLE) ×1
GOWN STRL REUS W/TWL XL LVL3 (GOWN DISPOSABLE) ×1 IMPLANT
HEMOSTAT ARISTA ABSORB 3G PWDR (HEMOSTASIS) IMPLANT
NDL HYPO 25X1 1.5 SAFETY (NEEDLE) IMPLANT
NDL SAFETY ECLIP 18X1.5 (MISCELLANEOUS) IMPLANT
NDL SPNL 18GX3.5 QUINCKE PK (NEEDLE) IMPLANT
NEEDLE HYPO 25X1 1.5 SAFETY (NEEDLE) IMPLANT
NEEDLE SPNL 18GX3.5 QUINCKE PK (NEEDLE) IMPLANT
NS IRRIG 1000ML POUR BTL (IV SOLUTION) ×1 IMPLANT
PACK BASIN DAY SURGERY FS (CUSTOM PROCEDURE TRAY) ×1 IMPLANT
PACK UNIVERSAL I (CUSTOM PROCEDURE TRAY) ×1 IMPLANT
PENCIL SMOKE EVACUATOR (MISCELLANEOUS) ×1 IMPLANT
PIN SAFETY STERILE (MISCELLANEOUS) IMPLANT
SLEEVE SCD COMPRESS KNEE MED (STOCKING) ×1 IMPLANT
SPONGE T-LAP 18X18 ~~LOC~~+RFID (SPONGE) ×3 IMPLANT
STAPLER VISISTAT 35W (STAPLE) ×1 IMPLANT
SUT MNCRL AB 3-0 PS2 18 (SUTURE) IMPLANT
SUT MNCRL AB 3-0 PS2 27 (SUTURE) ×3 IMPLANT
SUT MNCRL AB 4-0 PS2 18 (SUTURE) ×2 IMPLANT
SUT MON AB 2-0 CT1 36 (SUTURE) IMPLANT
SUT MON AB 5-0 PS2 18 (SUTURE) IMPLANT
SUT SILK 2 0 SH (SUTURE) ×1 IMPLANT
SUT VIC AB 3-0 PS1 18 (SUTURE)
SUT VIC AB 3-0 PS1 18XBRD (SUTURE) IMPLANT
SYR BULB IRRIG 60ML STRL (SYRINGE) ×1 IMPLANT
TOWEL GREEN STERILE FF (TOWEL DISPOSABLE) ×2 IMPLANT
TUBE CONNECTING 20X1/4 (TUBING) ×1 IMPLANT
UNDERPAD 30X36 HEAVY ABSORB (UNDERPADS AND DIAPERS) ×2 IMPLANT
YANKAUER SUCT BULB TIP NO VENT (SUCTIONS) ×1 IMPLANT

## 2023-04-16 NOTE — Anesthesia Procedure Notes (Signed)
Procedure Name: LMA Insertion Date/Time: 04/16/2023 2:25 PM  Performed by: Lance Coon, CRNAPre-anesthesia Checklist: Patient identified, Emergency Drugs available, Suction available and Patient being monitored Patient Re-evaluated:Patient Re-evaluated prior to induction Oxygen Delivery Method: Circle system utilized Preoxygenation: Pre-oxygenation with 100% oxygen Induction Type: IV induction Ventilation: Mask ventilation without difficulty LMA: LMA inserted LMA Size: 3.0 Number of attempts: 1 Airway Equipment and Method: Bite block Placement Confirmation: positive ETCO2 Tube secured with: Tape Dental Injury: Teeth and Oropharynx as per pre-operative assessment

## 2023-04-16 NOTE — Discharge Instructions (Addendum)
Post Anesthesia Home Care Instructions  Activity: Get plenty of rest for the remainder of the day. A responsible individual must stay with you for 24 hours following the procedure.  For the next 24 hours, DO NOT: -Drive a car -Advertising copywriter -Drink alcoholic beverages -Take any medication unless instructed by your physician -Make any legal decisions or sign important papers.  Meals: Start with liquid foods such as gelatin or soup. Progress to regular foods as tolerated. Avoid greasy, spicy, heavy foods. If nausea and/or vomiting occur, drink only clear liquids until the nausea and/or vomiting subsides. Call your physician if vomiting continues.  Special Instructions/Symptoms: Your throat may feel dry or sore from the anesthesia or the breathing tube placed in your throat during surgery. If this causes discomfort, gargle with warm salt water. The discomfort should disappear within 24 hours.  If you had a scopolamine patch placed behind your ear for the management of post- operative nausea and/or vomiting:  1. The medication in the patch is effective for 72 hours, after which it should be removed.  Wrap patch in a tissue and discard in the trash. Wash hands thoroughly with soap and water. 2. You may remove the patch earlier than 72 hours if you experience unpleasant side effects which may include dry mouth, dizziness or visual disturbances. 3. Avoid touching the patch. Wash your hands with soap and water after contact with the patch.      Activity As tolerated. NO showers for 24 hours. Keep wrap on breasts until then. After showering, put wrap back on, this is important for compression. NO driving while in pain, taking pain medication or if you are unable to safely react to traffic. No heavy activities Take prescription pain medications as needed for severe pain. Otherwise, you can use ibuprofen or tylenol as needed. Avoid more than 3,000 mg of tylenol in 24 hours.  You do not need  an antibiotic post-operatively unless this was discussed with the provider at your pre-op appointment.  Diet: Regular. Drink plenty of fluids and eat healthy (high protein, low carbs), Try to optimize your nutrition with plenty of fruits and vegetables to improve healing. Protein shakes are a good option.  Wound Care: Keep dressing clean & dry. You may change bandages after showering if you continue to notice some drainage. You can reuse bandages if they are not dirty/soiled. Mild wound drainage is common after breast reduction surgery and should not be cause for alarm.  Special Instructions: Call Doctor if any unusual problems occur such as pain, excessive Bleeding, unrelieved Nausea/vomiting, Fever &/or chills  Follow-up appointment: Previously scheduled.         About my Jackson-Pratt Bulb Drain  What is a Jackson-Pratt bulb? A Jackson-Pratt is a soft, round device used to collect drainage. It is connected to a long, thin drainage catheter, which is held in place by one or two small stiches near your surgical incision site. When the bulb is squeezed, it forms a vacuum, forcing the drainage to empty into the bulb.  Emptying the Jackson-Pratt bulb- To empty the bulb: 1. Release the plug on the top of the bulb. 2. Pour the bulb's contents into a measuring container which your nurse will provide. 3. Record the time emptied and amount of drainage. Empty the drain(s) as often as your     doctor or nurse recommends.  Date                  Time  Amount (Drain 1)                 Amount (Drain  2)  _____________________________________________________________________  _____________________________________________________________________  _____________________________________________________________________  _____________________________________________________________________  _____________________________________________________________________  _____________________________________________________________________  _____________________________________________________________________  _____________________________________________________________________  Squeezing the Jackson-Pratt Bulb- To squeeze the bulb: 1. Make sure the plug at the top of the bulb is open. 2. Squeeze the bulb tightly in your fist. You will hear air squeezing from the bulb. 3. Replace the plug while the bulb is squeezed. 4. Use a safety pin to attach the bulb to your clothing. This will keep the catheter from     pulling at the bulb insertion site.  When to call your doctor- Call your doctor if: Drain site becomes red, swollen or hot. You have a fever greater than 101 degrees F. There is oozing at the drain site. Drain falls out (apply a guaze bandage over the drain hole and secure it with tape). Drainage increases daily not related to activity patterns. (You will usually have more drainage when you are active than when you are resting.) Drainage has a bad odor.

## 2023-04-16 NOTE — Transfer of Care (Signed)
Immediate Anesthesia Transfer of Care Note  Patient: Elizabeth Benton  Procedure(s) Performed: debridement of seroma cavity and placement of a drain, left breast (Left: Breast)  Patient Location: PACU  Anesthesia Type:General  Level of Consciousness: awake and alert   Airway & Oxygen Therapy: Patient Spontanous Breathing and Patient connected to face mask oxygen  Post-op Assessment: Report given to RN and Post -op Vital signs reviewed and stable  Post vital signs: Reviewed and stable  Last Vitals:  Vitals Value Taken Time  BP 138/74 04/16/23 1510  Temp    Pulse 87 04/16/23 1511  Resp 16 04/16/23 1511  SpO2 99 % 04/16/23 1511  Vitals shown include unvalidated device data.  Last Pain:  Vitals:   04/16/23 1201  TempSrc: Oral  PainSc: 0-No pain      Patients Stated Pain Goal: 4 (04/16/23 1201)  Complications: No notable events documented.

## 2023-04-16 NOTE — Op Note (Signed)
DATE OF OPERATION: 04/16/2023  LOCATION: [facility] operating Room  PREOPERATIVE DIAGNOSIS: Persistent seroma  POSTOPERATIVE DIAGNOSIS: Same  PROCEDURE: Seroma cavity debridement and placement of drain  SURGEON: Loren Racer, MD  ASSISTANT: Matt Scheeler  EBL: 25 cc  CONDITION: Stable  COMPLICATIONS: None  INDICATION: The patient, Elizabeth Benton, is a 77 y.o. 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   PROCEDURE DETAILS:  The patient was seen prior to surgery and marked.   IV antibiotics were given. The patient was taken to the operating room and given a general anesthetic. A standard time out was performed and all information was confirmed by those in the room. SCDs were placed.   The chest was prepped and draped in the usual sterile manner.  The previous scar was excised with additional skin.  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  Dissection was carried out down to the seroma cavity which was unroofed and the serous fluid evacuated.  On evaluation of the cavity it appeared that there was additional ADM at the base of the cavity which needed to be removed this was excised from the chest wall using the electrocautery.  All of the tissue removed was sent to pathology as seroma cavity.  Hemostasis was achieved with the electrocautery a 19 French round drain was placed through a separate stab incision.  The wound bed was coated with Arista hemostatic agent.  The deep layers were closed with interrupted 3-0 Monocryl sutures.  The dermis was closed with interrupted 3-0 Monocryl sutures.  The skin was closed with a running 4-0 Monocryl subcuticular stitch.  The incision was sealed with Dermabond.  The patient tolerated the procedure well, there were no complications.  All instrument needle and sponge counts were reported as correct. The patient was allowed to wake up and taken to recovery room in stable  condition at the end of the case. The family was notified at the end of the case.   The advanced practice practitioner (APP) assisted throughout the case.  The APP was essential in retraction and counter traction when needed to make the case progress smoothly.  This retraction and assistance made it possible to see the tissue plans for the procedure.  The assistance was needed for blood control, tissue re-approximation and assisted with closure of the incision site.

## 2023-04-16 NOTE — Anesthesia Postprocedure Evaluation (Signed)
Anesthesia Post Note  Patient: Clearance [name]  Procedure(s) Performed: debridement of seroma cavity and placement of a drain, left breast (Left: Breast)     Patient location during evaluation: PACU Anesthesia Type: General Level of consciousness: awake and alert Pain management: pain level controlled Vital Signs Assessment: post-procedure vital signs reviewed and stable Respiratory status: spontaneous breathing, nonlabored ventilation, respiratory function stable and patient connected to nasal cannula oxygen Cardiovascular status: blood pressure returned to baseline and stable Postop Assessment: no apparent nausea or vomiting Anesthetic complications: no  No notable events documented.  Last Vitals:  Vitals:   04/16/23 1536 04/16/23 1545  BP:  132/77  Pulse: 81 79  Resp: 12 16  Temp:    SpO2: 98% 99%    Last Pain:  Vitals:   04/16/23 1545  TempSrc:   PainSc: 4                  Blessed Girdner S

## 2023-04-16 NOTE — Interval H&P Note (Signed)
History and Physical Interval Note: Pt met in the pre op area, no change in exam or indication for surgery. All questions answered, site marked with her concurrence. Will proceed with wound washout, debridement, placement of drain to treat seroma.  04/16/2023 1:24 PM  Clearance [name]  has presented today for surgery, with the diagnosis of L breast seroma.  The various methods of treatment have been discussed with the patient and family. After consideration of risks, benefits and other options for treatment, the patient has consented to  Procedure(s): debridement of seroma cavity and placement of a drain, left breast (Left) as a surgical intervention.  The patient's history has been reviewed, patient examined, no change in status, stable for surgery.  I have reviewed the patient's chart and labs.  Questions were answered to the patient's satisfaction.     Elizabeth Benton

## 2023-04-16 NOTE — Anesthesia Preprocedure Evaluation (Signed)
Anesthesia Evaluation  Patient identified by MRN, date of birth, ID band Patient awake    Reviewed: Allergy & Precautions, H&P , NPO status , Patient's Chart, lab work & pertinent test results  Airway Mallampati: II  TM Distance: >3 FB Neck ROM: Full    Dental no notable dental hx.    Pulmonary neg pulmonary ROS, former smoker   Pulmonary exam normal breath sounds clear to auscultation       Cardiovascular hypertension, Pt. on medications Normal cardiovascular exam Rhythm:Regular Rate:Normal     Neuro/Psych negative neurological ROS  negative psych ROS   GI/Hepatic negative GI ROS, Neg liver ROS,,,  Endo/Other  negative endocrine ROS    Renal/GU negative Renal ROS  negative genitourinary   Musculoskeletal negative musculoskeletal ROS (+)    Abdominal   Peds negative pediatric ROS (+)  Hematology negative hematology ROS (+)   Anesthesia Other Findings   Reproductive/Obstetrics negative OB ROS                             Anesthesia Physical Anesthesia Plan  ASA: 2  Anesthesia Plan: General   Post-op Pain Management: Minimal or no pain anticipated   Induction: Intravenous  PONV Risk Score and Plan: 2 and Ondansetron, Dexamethasone and Treatment may vary due to age or medical condition  Airway Management Planned: LMA  Additional Equipment:   Intra-op Plan:   Post-operative Plan: Extubation in OR  Informed Consent: I have reviewed the patients History and Physical, chart, labs and discussed the procedure including the risks, benefits and alternatives for the proposed anesthesia with the patient or authorized representative who has indicated his/her understanding and acceptance.     Dental advisory given  Plan Discussed with: CRNA and Surgeon  Anesthesia Plan Comments:        Anesthesia Quick Evaluation

## 2023-04-19 ENCOUNTER — Encounter (HOSPITAL_BASED_OUTPATIENT_CLINIC_OR_DEPARTMENT_OTHER): Payer: Self-pay | Admitting: Plastic Surgery

## 2023-04-19 LAB — SURGICAL PATHOLOGY

## 2023-04-21 ENCOUNTER — Encounter: Payer: Medicare Other | Admitting: Plastic Surgery

## 2023-04-26 ENCOUNTER — Ambulatory Visit (INDEPENDENT_AMBULATORY_CARE_PROVIDER_SITE_OTHER): Payer: 59 | Admitting: Plastic Surgery

## 2023-04-26 ENCOUNTER — Encounter: Payer: Self-pay | Admitting: Plastic Surgery

## 2023-04-26 VITALS — BP 124/77 | HR 86 | Ht <= 58 in | Wt 130.2 lb

## 2023-04-26 DIAGNOSIS — N6489 Other specified disorders of breast: Secondary | ICD-10-CM

## 2023-04-26 DIAGNOSIS — Z9889 Other specified postprocedural states: Secondary | ICD-10-CM

## 2023-04-26 NOTE — Progress Notes (Signed)
Elizabeth Benton returns today after her procedure to washout her seroma and excised the capsule.  She is doing well with minimal discomfort.  She reports her drain output has been serous but she is still having close to 30 mL/day.  On evaluation there is no evidence of fluid collection and the incision is clean dry and intact.  I have elected to leave the drain for now as the patient is still right on the line for where it should be removed.  She will return on Thursday or Friday if the drain output is consistently less than 30 mL for next Monday if not.

## 2023-05-03 ENCOUNTER — Ambulatory Visit (INDEPENDENT_AMBULATORY_CARE_PROVIDER_SITE_OTHER): Payer: 59 | Admitting: Plastic Surgery

## 2023-05-03 VITALS — BP 138/81 | HR 85

## 2023-05-03 DIAGNOSIS — N6489 Other specified disorders of breast: Secondary | ICD-10-CM

## 2023-05-03 NOTE — Progress Notes (Signed)
Ms. Ebel returns today for evaluation after drainage of seroma and debridement of seroma cavity.  Her drain output at this point is purely serous and minimal.  Incisions are clean dry and intact no evidence of seroma or recurrence.  Drain was removed today without difficulty.  Patient may slowly increase activity.  There are no restrictions on range of motion however I would  like for her to avoid weighted physical therapy exercises for 1 month.  She will return to see me in 2 months sooner if needed

## 2023-05-05 ENCOUNTER — Encounter: Payer: Medicare Other | Admitting: Student

## 2023-05-06 ENCOUNTER — Ambulatory Visit: Payer: 59 | Attending: Hematology and Oncology

## 2023-05-06 DIAGNOSIS — M25611 Stiffness of right shoulder, not elsewhere classified: Secondary | ICD-10-CM | POA: Insufficient documentation

## 2023-05-06 DIAGNOSIS — C50411 Malignant neoplasm of upper-outer quadrant of right female breast: Secondary | ICD-10-CM | POA: Diagnosis present

## 2023-05-06 DIAGNOSIS — R222 Localized swelling, mass and lump, trunk: Secondary | ICD-10-CM

## 2023-05-06 DIAGNOSIS — Z170 Estrogen receptor positive status [ER+]: Secondary | ICD-10-CM | POA: Insufficient documentation

## 2023-05-06 DIAGNOSIS — R293 Abnormal posture: Secondary | ICD-10-CM | POA: Diagnosis present

## 2023-05-06 DIAGNOSIS — M25612 Stiffness of left shoulder, not elsewhere classified: Secondary | ICD-10-CM | POA: Diagnosis present

## 2023-05-06 NOTE — Therapy (Signed)
OUTPATIENT PHYSICAL THERAPY BREAST CANCER RE-EVAL   Patient Name: Elizabeth Benton MRN: [id number] DOB:1946/08/20, 77 y.o., female Today's Date: 05/06/2023  END OF SESSION:  PT End of Session - 05/06/23 1203     Visit Number 21    Number of Visits 31    Date for PT Re-Evaluation 06/17/23    PT Start Time 1205    PT Stop Time 1255    PT Time Calculation (min) 50 min    Activity Tolerance Patient tolerated treatment well    Behavior During Therapy [facility] for tasks assessed/performed              Past Medical History:  Diagnosis Date   Arthritis    Breast cancer (HCC)    Depression    H/O migraine    HSV-2 (herpes simplex virus 2) infection    Hyperlipidemia    Hypertension    Lichen sclerosus    Migraine    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   Vaginal atrophy 12/29/2007   Vaginitis and vulvovaginitis 12/28/2004   Past Surgical History:  Procedure Laterality Date   ABDOMINAL HYSTERECTOMY  1987   BREAST RECONSTRUCTION WITH PLACEMENT OF TISSUE EXPANDER AND FLEX HD (ACELLULAR HYDRATED DERMIS) Bilateral 10/20/2022   Procedure: BREAST RECONSTRUCTION WITH PLACEMENT OF TISSUE EXPANDER AND FLEX HD (ACELLULAR HYDRATED DERMIS);  Surgeon: Janne Napoleon, MD;  Location: [facility] OR;  Service: Plastics;  Laterality: Bilateral;   DILATION AND CURETTAGE OF UTERUS     IRRIGATION AND DEBRIDEMENT OF WOUND WITH SPLIT THICKNESS SKIN GRAFT Left 04/16/2023   Procedure: debridement of seroma cavity and placement of a drain, left breast;  Surgeon: Santiago Glad, MD;  Location: [facility];  Service: Plastics;  Laterality: Left;   MASTECTOMY W/ SENTINEL NODE BIOPSY Right 10/20/2022   Procedure: RIGHT MASTECTOMY WITH RIGHT SENTINEL LYMPH NODE BIOPSY;  Surgeon: Almond Lint, MD;  Location: [facility] OR;  Service: General;  Laterality: Right;   REMOVAL OF BILATERAL TISSUE EXPANDERS WITH PLACEMENT OF BILATERAL BREAST IMPLANTS Bilateral 01/08/2023   Procedure: Removal of bilateral tissue expanders;   Surgeon: Santiago Glad, MD;  Location: [facility];  Service: Plastics;  Laterality: Bilateral;   TONSILLECTOMY     TOTAL MASTECTOMY Left 10/20/2022   Procedure: LEFT TOTAL MASTECTOMY;  Surgeon: Almond Lint, MD;  Location: [facility] OR;  Service: General;  Laterality: Left;   TUBAL LIGATION     WISDOM TOOTH EXTRACTION     Patient Active Problem List   Diagnosis Date Noted   S/P breast reconstruction 10/20/2022   Genetic testing 08/24/2022   Family history of breast cancer 08/06/2022   Family history of prostate cancer 08/06/2022   Malignant neoplasm of upper-outer quadrant of right breast in female, estrogen receptor positive (HCC) 07/31/2022   Abnormal glucose level 12/05/2020   Constipation 12/05/2020   Lumbago with sciatica 12/05/2020   Osteopenia 12/05/2020   Overweight 12/05/2020   Screening for malignant neoplasm of colon 12/05/2020   Vitamin D deficiency 12/05/2020   Essential hypertension 11/30/2019   LBBB (left bundle branch block) 11/30/2019   Exertional dyspnea 11/30/2019   Lichen sclerosus 06/01/2012   Mixed hyperlipidemia    HSV-2 (herpes simplex virus 2) infection    Depression    H/O migraine    Monilia infection    Vaginal atrophy    Wears glasses 06/23/2011   Sinus problem/runny nose 06/23/2011   Mass on back 06/23/2011    PCP: Andi Devon, MD  REFERRING PROVIDER: Serena Croissant, MD  REFERRING  DIAG: Right Breast Cancer  THERAPY DIAG:  Malignant neoplasm of upper-outer quadrant of right breast in female, estrogen receptor positive (HCC)  Abnormal posture  Stiffness of left shoulder, not elsewhere classified  Stiffness of right shoulder, not elsewhere classified  Localized swelling of chest wall  Rationale for Evaluation and Treatment: Rehabilitation  ONSET DATE: 07/24/2022  SUBJECTIVE:                                                                                                                                                                                            SUBJECTIVE STATEMENT I had the I and D on 04/16/2023 with a drain that was removed on 05/03/2023. She has not had any drainage since then. It is already feeling better than it was. The incision is more even now too.  I have been released for ROM but 6 weeks before I start doing any little wts. I haven't started doing anything yet.. I wanted to wait until I came in. PERTINENT HISTORY:  Patient was diagnosed on 07/24/2022 with right grade 2 Invasive Lobular Carcinoma. It measures 1.2 cm and is located in the upper-outer quadrant. It is ER 95%, PR 80% and HER 2 negative with a Ki67 of 40%. She is s/p a Bilateral Mastectomy on 10/20/2022  for Right Breast Cancer and with left prophylactic and bilateral tissue expanders .  She has since had the expanders removed  and bilateral subtotal capsulotomies on Jan. 12,2024. She had 90 cc  of fluid aspirated on 01/28/2023,and 50 cc of  fluid aspirated 02/08/2023 from the left breast area  PATIENT GOALS:  Reassess how my recovery is going related to arm function, pain, and swelling since expander removal and capsulectomies and recent I and D  PAIN:  Are you having pain?  PAIN:  Are you having pain? Yes NPRS scale: 2/10 discomfort left chest and  trunk region under armpit,  more tightness and soreness than pain Pain location: left lateral trunk,  under arm pit on left Pain orientation: left PAIN TYPE:   Pain description: intermittent Aggravating factors: Relieving factors: compression, MLD, tumeric  PRECAUTIONS: Recent Surgery, right UE Lymphedema risk, DDD, LBP  ACTIVITY LEVEL / LEISURE: ,  not returned to work, goes back March 4   OBJECTIVE:   PATIENT SURVEYS:  QUICK DASH: Initial eval 61.36,  Re-eval  39%, 05/06/2023  52%  OBSERVATIONS: Pt with larger right breast  than left secondary to expander filled more. Mastectomy incisions are healed with glue still present. Left axillary region with area of swelling/tissue  greater than right. Pt will be having an [place] today on the left. Having expanders  removed in early January.  02/17/2023 Ballotable fluid noted left chest region, Right incision area irregular and folding inward. Left incision healed but also irregular without folding noted  05/06/2023: incision healed. Drain area covered with gauze. Left side now appears more symmetrical to right side.  POSTURE:  Forward head, rounded shoulders  LYMPHEDEMA ASSESSMENT:     UPPER EXTREMITY AROM/PROM:   A/PROM RIGHT   eval   Right 12/02/2022 RIGHT 12/29/2022 RIGHT 01/05/2023 Right Re-eval RIGHT 03/02/2023 Right 03/30/2023 RIGHT 05/06/23  Shoulder extension 60 49 63   57 tight axilla 60 59 53  Flexion      128 axilla 143 150 147  Shoulder abduction 168 68 145 149 tight axilla 130 143 174 152  Shoulder internal rotation 55    60 70 70 64  Shoulder external rotation 112    98 109 110 103                          (Blank rows = not tested)   A/PROM LEFT   eval LEFT 2023 LEFT 12/29/2022 LEFT Re-eval LEFT 03/02/2023 LEFT 03/30/2023 LEFT 05/06/23  Shoulder extension 58 46 65 52 tight 60 43 51  Shoulder flexion 159 120 160 155 tight chest 160 145 120  Shoulder abduction 165 90 156 123 tight axilla 158 118 77  Shoulder internal rotation 65   55 65 60   Shoulder external rotation 112   88 107 95                           (Blank rows = not tested)     CERVICAL AROM: All within functional  limits: Decreased 20 % Rotation and 50% SB         UPPER EXTREMITY STRENGTH: WNL     LYMPHEDEMA ASSESSMENTS:    LANDMARK RIGHT   eval Right 12/02/2022 RIGHT 02/17/2023 Right 05/06/23  10 cm proximal to olecranon process 28.5 28.7 28.6 27.7  Olecranon process 24.6 24.6 24.6 24.3  10 cm proximal to ulnar styloid process 21.8 21.7 21.4 21.5  Just proximal to ulnar styloid process 16.4 16.3 16.4 16.5  Across hand at thumb web space 18.3 18.2 18.4 18.1  At base of 2nd digit 6.5 6.6 6.4 6.5  (Blank rows = not tested)   LANDMARK  LEFT   eval LEFT 12/02/2022 LEFT 05/06/2023  10 cm proximal  29.3 29.7 28.1  Olecranon process  24.9 24.7 24.6  10 cm proximal to ulnar styloid process 21.0 21.4 21.1  Just proximal to ulnar styloid process 16.3 16.3 16.0  Across hand at thumb web space 18.0 17.9 17.2  At base of 2nd digit 6.45 6.4 6.2  (Blank rows = not tested)      Surgery type/Date: 10/20/2022 bilateral Mastectomy with right SLNB, 01/08/2023 Removal of bilateral tissue expanders and bilateral capsulectomies, 04/16/2023 Left I and D Number of lymph nodes removed: 0/2 right Current/past treatment (chemo, radiation, hormone therapy): No chemo required, will start anti-estrogen therapy Other symptoms:  Heaviness/tightness Yes Pain Yes Pitting edema No Infections No Decreased scar mobility Yes Stemmer sign No  TREATMENT TODAY  05/06/2023 Supine clasped hands flexion x 5, stargazer with assist by PT to get pt to relax x 5 Gentle PROM bilateral shoulder flexion, scaption, abd, IR and ER    04/02/2023 Supine STM to left UT, Pecs, Lats and in SL to UT and scapular area with cocoa butter PROM left shoulder  going into  flexion, scaption and abduction with MFR at axilla to decrease soreness MLD to left chest area of swelling. Supraclavicular region, 5 diaphragmatic breaths, left inguinal LN's, left axillo inguinal pathway, left chest directing toward pathway multiple times  on left chest and ending with LN's.  MFR to bilateral pectoral and rib area   03/30/2023 Supine STM to left UT, Pecs, Lats and in SL to UT and scapular area with cocoa butter PROM left shoulder going slowly into scaption and abduction with MFR at axilla to decrease soreness. AROM bilateral flexion, scaption, horizontal abd x 5 Measured bilateral AROM shoulder Hold resistance exs per MD for 2 weeks 03/18/2023 Did pulleys at home Scapular retraction yellow x 10, shoulder extension x 10 yellow, bilateral ER x 10 yellow Supine STM to left UT, Pecs, Lats  with cocoa butter MFR to left axillary cording with arm resting on pillow PROM left shoulder going slowly into scaption and abduction with MFR at axilla to decrease soreness. MLD to left chest area of swelling. Supraclavicular region, 5 diaphragmatic breaths, left inguinal LN's, left axillo inguinal pathway, left chest directing toward pathway multiple times avoiding darker tender region on left chest and ending with LN's.   03/16/2023  Pulleys x 1:30 flexion, scaption, abduction Standing lat stretch x 4, Wall slides on left with pt shown how to apply pressure with right hand to rib cage area x 5 NTS for left UE with wrist oscillation Instructed pt while performing MLD to left chest area of swelling. Supraclavicular region, 5 diaphragmatic breaths, left inguinal LN's, left axillo inguinal pathway, left chest directing toward pathway multiple times avoiding darker tender region on left chest and ending with LN's. Had pt perform all steps and gave written instructions. Advised pt to keep an eye on darker area of left chest , check for heat, pain to be sure it doesn't get larger and to avoid that area with MLD. Contact MD if painful or enlarges. PROM left shoulder with MFR to left axillary area of cording for flexion, scaption, abduction.  03/11/2023  Pulleys x 1:30 flexion, scaption, abd. To warm up Standing yellow theraband x 10 scapular retraction, extension, bilateral ER STM bilateral UT, pectorals, lats with cocoa butter PROM bilateral shoulder flexion, scaption, abduction, ER with intermittent MFR at axilla for cording Standing counter stretch x 4, standing single arm chest stretch bilaterally   03/09/2023  Pulleys x 1:30 flex, scaption, Abd Standing jobes flexion  and scaption, abd x 10 STM bilateral UT, pectorals, lats with cocoa butter PROM bilateral shoulder flexion, scaption, abduction, ER with intermittent MFR at axilla for cording MFR bilateral axillary and upper arm cording with  arm in D2 flexion  Supine scapular series x 10 with yellow except sword x 5  03/04/2023 Pulleys x 1:30 flex, scaption, Abd, Ball rolls flexion x 10, bilateral abd x 5 STM bilateral UT, pectorals, lats with cocoa butter PROM bilateral shoulder flexion, scaption, abduction, ER with intermittent MFR at axilla for cording MFR bilateral chest region bilaterally Supine scapular series x 10 including sword x 5 with yellow band   03/02/2023 STM bilateral UT, pectorals, lats with cocoa butter PROM bilateral shoulder flexion, scaption, abduction, ER with intermittent MFR at axilla for cording MFR bilateral chest region bilaterally Supine scapular series x 10 except sword with yellow band Updated HEP with band exercises Measured ROM 02/26/2023  STM bilateral UT, pectorals, lats with cocoa butter PROM bilateral shoulder flexion, scaption, abduction, ER with intermittent MFR at axilla for cording MFR  bilateral chest region AROM supine bilateral Flexion, scaption, horizontal abduction x 5 Corner pec stretch and doorway pecs stretch x 3-4 ea, SB stretch x 2 ea Updated HEP with stretches     PATIENT EDUCATION:   Access Code: [email or web address] URL: [email or web address] Date: 02/26/2023 Prepared by: Alvira Monday  Exercises - Supine Lower Trunk Rotation  - 2 x daily - 7 x weekly - 1 sets - 3-4 reps - 20 hold - Corner Pec Major Stretch  - 2 x daily - 7 x weekly - 1 sets - 3-4 reps - 20 hold - Single Arm Doorway Pec Stretch at 90 Degrees Abduction  - 2 x daily - 7 x weekly - 1 sets - 3-4 reps - 20 hold - TL Sidebending Stretch - Single Arm Overhead  - 2 x daily - 7 x weekly - 1 sets - 3-4 reps Education details: Reviewed 4 post op exercises. Did better with abduction sliding hand on wall Person educated: Patient Education method: Explanation and Demonstration Education comprehension: returned demonstration    HOME EXERCISE PROGRAM: Reviewed previously given post op HEP. Changed wall  walk to wall slide and pt demonstrated good improvement Supine scapular series except sword x 10 with yellow ASSESSMENT:  CLINICAL IMPRESSION:  Pt is now s/p left I and D for seroma that was drained multiple times and continued to return. Drain was removed on May 6. She presents with healed left chest and drain incision and limitations in bilateral shoulder ROM greatest on the left. We reviewed post op exercises and then PROM was performed bilaterally.. Pt required occasional VC's to relax but did very well overall. She will benefit from skilled PT to address deficits and return to PLOF . Pt will benefit from skilled therapeutic intervention to improve on the following deficits: Decreased knowledge of precautions, impaired UE functional use, pain, decreased ROM, postural dysfunction., edema   PT treatment/interventions: ADL/Self care home management, Therapeutic exercises, Therapeutic activity, Neuromuscular re-education, Patient/Family education, Self Care, Joint mobilization, Orthotic/Fit training, Dry Needling, Manual lymph drainage, scar mobilization, Manual therapy, and Re-evaluation   GOALS: Goals reviewed with patient? Yes  LONG TERM GOALS:  (STG=LTG)  GOALS Name Target Date  Goal status  1 Pt will demonstrate she has regained full shoulder ROM and function post operatively compared to baselines.  Baseline: 01/13/2023 03/31/2023 06/17/2023 ONGOING  2 Pts quick dash will be improved to pre-surgery level of 36% 03/31/2023 ONGOING  3 Pt will be able to dress and bathe and perform light household chores independently 01/13/2023 06/16/2023 MET 12/29/2022 Now slightly limited on left 03/30/2023/05/06/2023  4 Pt will have decreased pain by 50% overall 01/13/2023 06/16/23  ONGOING    5. Pt will resume usual home and work activities with minimal complaint 03/31/2023 06/17/2023 MET on right 03/30/2023 Ongoing left     PLAN:  PT FREQUENCY/DURATION:1- 2x/week x 6 weeks  PLAN FOR NEXT SESSION:  OK  for ROM no wts yet, ROM, Left UE cording?,, Did she do MLD, has pulleys;STM bilateral UQ, AAROM, PROM bilateral shoulders,  ABC strength handout,MLD prn especially to left  chest axillary region,   [facility]  [address]  [place] [address]  [phone]  After Breast Cancer Class It is recommended you attend the ABC class to be educated on lymphedema risk reduction. This class is free of charge and lasts for 1 hour. It is a 1-time class. You will need to download the Webex app either on your phone or computer. We  will send you a link the night before or the morning of the class. You should be able to click on that link to join the class. This is not a confidential class. You don't have to turn your camera on, but other participants may be able to see your email address.  Scar massage You can begin gentle scar massage to you incision sites. Gently place one hand on the incision and move the skin (without sliding on the skin) in various directions. Do this for a few minutes and then you can gently massage either coconut oil or vitamin E cream into the scars.  Compression garment You should continue wearing your compression bra until you feel like you no longer have swelling.  Home exercise Program Continue doing the exercises you were given until you feel like you can do them without feeling any tightness at the end.   Walking Program Studies show that 30 minutes of walking per day (fast enough to elevate your heart rate) can significantly reduce the risk of a cancer recurrence. If you can't walk due to other medical reasons, we encourage you to find another activity you could do (like a stationary bike or water exercise).  Posture After breast cancer surgery, people frequently sit with rounded shoulders posture because it puts their incisions on slack and feels better. If you sit like this and scar tissue forms in that position, you can become very tight  and have pain sitting or standing with good posture. Try to be aware of your posture and sit and stand up tall to heal properly.  Follow up PT: It is recommended you return every 3 months for the first 3 years following surgery to be assessed on the SOZO machine for an L-Dex score. This helps prevent clinically significant lymphedema in 95% of patients. These follow up screens are 10 minute appointments that you are not billed for.  Waynette Buttery, PT 05/06/2023, 12:59 PM

## 2023-05-11 ENCOUNTER — Ambulatory Visit: Payer: 59

## 2023-05-11 DIAGNOSIS — Z170 Estrogen receptor positive status [ER+]: Secondary | ICD-10-CM

## 2023-05-11 DIAGNOSIS — R222 Localized swelling, mass and lump, trunk: Secondary | ICD-10-CM

## 2023-05-11 DIAGNOSIS — R293 Abnormal posture: Secondary | ICD-10-CM

## 2023-05-11 DIAGNOSIS — M25612 Stiffness of left shoulder, not elsewhere classified: Secondary | ICD-10-CM

## 2023-05-11 DIAGNOSIS — C50411 Malignant neoplasm of upper-outer quadrant of right female breast: Secondary | ICD-10-CM | POA: Diagnosis not present

## 2023-05-11 DIAGNOSIS — M25611 Stiffness of right shoulder, not elsewhere classified: Secondary | ICD-10-CM

## 2023-05-11 NOTE — Therapy (Signed)
OUTPATIENT PHYSICAL THERAPY BREAST CANCER RE-EVAL   Patient Name: Elizabeth Benton MRN: [id number] DOB:20-Sep-1946, 77 y.o., female Today's Date: 05/11/2023  END OF SESSION:  PT End of Session - 05/11/23 1158     Visit Number 22    Number of Visits 31    Date for PT Re-Evaluation 06/17/23    PT Start Time 1200    PT Stop Time 1255    PT Time Calculation (min) 55 min    Activity Tolerance Patient tolerated treatment well    Behavior During Therapy [facility] for tasks assessed/performed              Past Medical History:  Diagnosis Date   Arthritis    Breast cancer (HCC)    Depression    H/O migraine    HSV-2 (herpes simplex virus 2) infection    Hyperlipidemia    Hypertension    Lichen sclerosus    Migraine    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   Vaginal atrophy 12/29/2007   Vaginitis and vulvovaginitis 12/28/2004   Past Surgical History:  Procedure Laterality Date   ABDOMINAL HYSTERECTOMY  1987   BREAST RECONSTRUCTION WITH PLACEMENT OF TISSUE EXPANDER AND FLEX HD (ACELLULAR HYDRATED DERMIS) Bilateral 10/20/2022   Procedure: BREAST RECONSTRUCTION WITH PLACEMENT OF TISSUE EXPANDER AND FLEX HD (ACELLULAR HYDRATED DERMIS);  Surgeon: Janne Napoleon, MD;  Location: [facility] OR;  Service: Plastics;  Laterality: Bilateral;   DILATION AND CURETTAGE OF UTERUS     IRRIGATION AND DEBRIDEMENT OF WOUND WITH SPLIT THICKNESS SKIN GRAFT Left 04/16/2023   Procedure: debridement of seroma cavity and placement of a drain, left breast;  Surgeon: Santiago Glad, MD;  Location: [facility];  Service: Plastics;  Laterality: Left;   MASTECTOMY W/ SENTINEL NODE BIOPSY Right 10/20/2022   Procedure: RIGHT MASTECTOMY WITH RIGHT SENTINEL LYMPH NODE BIOPSY;  Surgeon: Almond Lint, MD;  Location: [facility] OR;  Service: General;  Laterality: Right;   REMOVAL OF BILATERAL TISSUE EXPANDERS WITH PLACEMENT OF BILATERAL BREAST IMPLANTS Bilateral 01/08/2023   Procedure: Removal of bilateral tissue expanders;   Surgeon: Santiago Glad, MD;  Location: [facility];  Service: Plastics;  Laterality: Bilateral;   TONSILLECTOMY     TOTAL MASTECTOMY Left 10/20/2022   Procedure: LEFT TOTAL MASTECTOMY;  Surgeon: Almond Lint, MD;  Location: [facility] OR;  Service: General;  Laterality: Left;   TUBAL LIGATION     WISDOM TOOTH EXTRACTION     Patient Active Problem List   Diagnosis Date Noted   S/P breast reconstruction 10/20/2022   Genetic testing 08/24/2022   Family history of breast cancer 08/06/2022   Family history of prostate cancer 08/06/2022   Malignant neoplasm of upper-outer quadrant of right breast in female, estrogen receptor positive (HCC) 07/31/2022   Abnormal glucose level 12/05/2020   Constipation 12/05/2020   Lumbago with sciatica 12/05/2020   Osteopenia 12/05/2020   Overweight 12/05/2020   Screening for malignant neoplasm of colon 12/05/2020   Vitamin D deficiency 12/05/2020   Essential hypertension 11/30/2019   LBBB (left bundle branch block) 11/30/2019   Exertional dyspnea 11/30/2019   Lichen sclerosus 06/01/2012   Mixed hyperlipidemia    HSV-2 (herpes simplex virus 2) infection    Depression    H/O migraine    Monilia infection    Vaginal atrophy    Wears glasses 06/23/2011   Sinus problem/runny nose 06/23/2011   Mass on back 06/23/2011    PCP: Andi Devon, MD  REFERRING PROVIDER: Serena Croissant, MD  REFERRING  DIAG: Right Breast Cancer  THERAPY DIAG:  Malignant neoplasm of upper-outer quadrant of right breast in female, estrogen receptor positive (HCC)  Abnormal posture  Stiffness of left shoulder, not elsewhere classified  Stiffness of right shoulder, not elsewhere classified  Localized swelling of chest wall  Rationale for Evaluation and Treatment: Rehabilitation  ONSET DATE: 07/24/2022  SUBJECTIVE:                                                                                                                                                                                            SUBJECTIVE STATEMENT I have been doing the exercises. The right arm is going pretty well and isn't as tight as the left.   PERTINENT HISTORY:  Patient was diagnosed on 07/24/2022 with right grade 2 Invasive Lobular Carcinoma. It measures 1.2 cm and is located in the upper-outer quadrant. It is ER 95%, PR 80% and HER 2 negative with a Ki67 of 40%. She is s/p a Bilateral Mastectomy on 10/20/2022  for Right Breast Cancer and with left prophylactic and bilateral tissue expanders .  She has since had the expanders removed  and bilateral subtotal capsulotomies on Jan. 12,2024. She had 90 cc  of fluid aspirated on 01/28/2023,and 50 cc of  fluid aspirated 02/08/2023 from the left breast area  PATIENT GOALS:  Reassess how my recovery is going related to arm function, pain, and swelling since expander removal and capsulectomies and recent I and D  PAIN:  Are you having pain?  PAIN:  Are you having pain? Yes NPRS scale: 2-3/10 discomfort left chest and  trunk region under armpit,  more tightness and soreness than pain, Pain location: left lateral trunk,  under arm pit on left Pain orientation: left PAIN TYPE:   Pain description: intermittent Aggravating factors: Relieving factors: compression, MLD, tumeric  PRECAUTIONS: Recent Surgery, right UE Lymphedema risk, DDD, LBP  ACTIVITY LEVEL / LEISURE: ,  not returned to work, goes back March 4   OBJECTIVE:   PATIENT SURVEYS:  QUICK DASH: Initial eval 61.36,  Re-eval  39%, 05/06/2023  52%  OBSERVATIONS: Pt with larger right breast  than left secondary to expander filled more. Mastectomy incisions are healed with glue still present. Left axillary region with area of swelling/tissue greater than right. Pt will be having an [place] today on the left. Having expanders removed in early January.  02/17/2023 Ballotable fluid noted left chest region, Right incision area irregular and folding inward. Left incision healed but also  irregular without folding noted  05/06/2023: incision healed. Drain area covered with gauze. Left side now appears more symmetrical to right side.  POSTURE:  Forward head, rounded shoulders  LYMPHEDEMA ASSESSMENT:     UPPER EXTREMITY AROM/PROM:   A/PROM RIGHT   eval   Right 12/02/2022 RIGHT 12/29/2022 RIGHT 01/05/2023 Right Re-eval RIGHT 03/02/2023 Right 03/30/2023 RIGHT 05/06/23  Shoulder extension 60 49 63   57 tight axilla 60 59 53  Flexion      128 axilla 143 150 147  Shoulder abduction 168 68 145 149 tight axilla 130 143 174 152  Shoulder internal rotation 55    60 70 70 64  Shoulder external rotation 112    98 109 110 103                          (Blank rows = not tested)   A/PROM LEFT   eval LEFT 2023 LEFT 12/29/2022 LEFT Re-eval LEFT 03/02/2023 LEFT 03/30/2023 LEFT 05/06/23  Shoulder extension 58 46 65 52 tight 60 43 51  Shoulder flexion 159 120 160 155 tight chest 160 145 120  Shoulder abduction 165 90 156 123 tight axilla 158 118 77  Shoulder internal rotation 65   55 65 60   Shoulder external rotation 112   88 107 95                           (Blank rows = not tested)     CERVICAL AROM: All within functional  limits: Decreased 20 % Rotation and 50% SB         UPPER EXTREMITY STRENGTH: WNL     LYMPHEDEMA ASSESSMENTS:    LANDMARK RIGHT   eval Right 12/02/2022 RIGHT 02/17/2023 Right 05/06/23  10 cm proximal to olecranon process 28.5 28.7 28.6 27.7  Olecranon process 24.6 24.6 24.6 24.3  10 cm proximal to ulnar styloid process 21.8 21.7 21.4 21.5  Just proximal to ulnar styloid process 16.4 16.3 16.4 16.5  Across hand at thumb web space 18.3 18.2 18.4 18.1  At base of 2nd digit 6.5 6.6 6.4 6.5  (Blank rows = not tested)   LANDMARK LEFT   eval LEFT 12/02/2022 LEFT 05/06/2023  10 cm proximal  29.3 29.7 28.1  Olecranon process  24.9 24.7 24.6  10 cm proximal to ulnar styloid process 21.0 21.4 21.1  Just proximal to ulnar styloid process 16.3 16.3 16.0  Across  hand at thumb web space 18.0 17.9 17.2  At base of 2nd digit 6.45 6.4 6.2  (Blank rows = not tested)      Surgery type/Date: 10/20/2022 bilateral Mastectomy with right SLNB, 01/08/2023 Removal of bilateral tissue expanders and bilateral capsulectomies, 04/16/2023 Left I and D Number of lymph nodes removed: 0/2 right Current/past treatment (chemo, radiation, hormone therapy): No chemo required, will start anti-estrogen therapy Other symptoms:  Heaviness/tightness Yes Pain Yes Pitting edema No Infections No Decreased scar mobility Yes Stemmer sign No  TREATMENT TODAY  05/11/2023  Supine STM to bilateral UT, Pecs, Lats with cocoa butter Supine wand flexion and scaption x 5, stargazer x 5 PROM bilateral  shoulder going into  flexion, scaption, ER and abduction with MFR at axilla to decrease soreness MFR to bilateral chest regions AROM bilateral shoulder flexion, scaption, horizontal abd x 5  05/06/2023 Supine clasped hands flexion x 5, stargazer with assist by PT to get pt to relax x 5 Gentle PROM bilateral shoulder flexion, scaption, abd, IR and ER  04/02/2023 Supine STM to left UT, Pecs, Lats and in SL to UT and scapular area with  cocoa butter PROM left shoulder going into  flexion, scaption and abduction with MFR at axilla to decrease soreness MLD to left chest area of swelling. Supraclavicular region, 5 diaphragmatic breaths, left inguinal LN's, left axillo inguinal pathway, left chest directing toward pathway multiple times  on left chest and ending with LN's.  MFR to bilateral pectoral and rib area   03/30/2023 Supine STM to left UT, Pecs, Lats and in SL to UT and scapular area with cocoa butter PROM left shoulder going slowly into scaption and abduction with MFR at axilla to decrease soreness. AROM bilateral flexion, scaption, horizontal abd x 5 Measured bilateral AROM shoulder Hold resistance exs per MD for 2 weeks 03/18/2023 Did pulleys at home Scapular retraction yellow x  10, shoulder extension x 10 yellow, bilateral ER x 10 yellow Supine STM to left UT, Pecs, Lats with cocoa butter MFR to left axillary cording with arm resting on pillow PROM left shoulder going slowly into scaption and abduction with MFR at axilla to decrease soreness. MLD to left chest area of swelling. Supraclavicular region, 5 diaphragmatic breaths, left inguinal LN's, left axillo inguinal pathway, left chest directing toward pathway multiple times avoiding darker tender region on left chest and ending with LN's.   03/16/2023  Pulleys x 1:30 flexion, scaption, abduction Standing lat stretch x 4, Wall slides on left with pt shown how to apply pressure with right hand to rib cage area x 5 NTS for left UE with wrist oscillation Instructed pt while performing MLD to left chest area of swelling. Supraclavicular region, 5 diaphragmatic breaths, left inguinal LN's, left axillo inguinal pathway, left chest directing toward pathway multiple times avoiding darker tender region on left chest and ending with LN's. Had pt perform all steps and gave written instructions. Advised pt to keep an eye on darker area of left chest , check for heat, pain to be sure it doesn't get larger and to avoid that area with MLD. Contact MD if painful or enlarges. PROM left shoulder with MFR to left axillary area of cording for flexion, scaption, abduction.  03/11/2023  Pulleys x 1:30 flexion, scaption, abd. To warm up Standing yellow theraband x 10 scapular retraction, extension, bilateral ER STM bilateral UT, pectorals, lats with cocoa butter PROM bilateral shoulder flexion, scaption, abduction, ER with intermittent MFR at axilla for cording Standing counter stretch x 4, standing single arm chest stretch bilaterally   03/09/2023  Pulleys x 1:30 flex, scaption, Abd Standing jobes flexion  and scaption, abd x 10 STM bilateral UT, pectorals, lats with cocoa butter PROM bilateral shoulder flexion, scaption, abduction, ER  with intermittent MFR at axilla for cording MFR bilateral axillary and upper arm cording with arm in D2 flexion  Supine scapular series x 10 with yellow except sword x 5  03/04/2023 Pulleys x 1:30 flex, scaption, Abd, Ball rolls flexion x 10, bilateral abd x 5 STM bilateral UT, pectorals, lats with cocoa butter PROM bilateral shoulder flexion, scaption, abduction, ER with intermittent MFR at axilla for cording MFR bilateral chest region bilaterally Supine scapular series x 10 including sword x 5 with yellow band   03/02/2023 STM bilateral UT, pectorals, lats with cocoa butter PROM bilateral shoulder flexion, scaption, abduction, ER with intermittent MFR at axilla for cording MFR bilateral chest region bilaterally Supine scapular series x 10 except sword with yellow band Updated HEP with band exercises Measured ROM 02/26/2023  STM bilateral UT, pectorals, lats with cocoa butter PROM bilateral shoulder flexion, scaption, abduction, ER with intermittent MFR  at axilla for cording MFR bilateral chest region AROM supine bilateral Flexion, scaption, horizontal abduction x 5 Corner pec stretch and doorway pecs stretch x 3-4 ea, SB stretch x 2 ea Updated HEP with stretches     PATIENT EDUCATION:   Access Code: [email or web address] URL: [email or web address] Date: 02/26/2023 Prepared by: Alvira Monday  Exercises - Supine Lower Trunk Rotation  - 2 x daily - 7 x weekly - 1 sets - 3-4 reps - 20 hold - Corner Pec Major Stretch  - 2 x daily - 7 x weekly - 1 sets - 3-4 reps - 20 hold - Single Arm Doorway Pec Stretch at 90 Degrees Abduction  - 2 x daily - 7 x weekly - 1 sets - 3-4 reps - 20 hold - TL Sidebending Stretch - Single Arm Overhead  - 2 x daily - 7 x weekly - 1 sets - 3-4 reps Education details: Reviewed 4 post op exercises. Did better with abduction sliding hand on wall Person educated: Patient Education method: Explanation and Demonstration Education comprehension: returned  demonstration    HOME EXERCISE PROGRAM: Reviewed previously given post op HEP. Changed wall walk to wall slide and pt demonstrated good improvement Supine scapular series except sword x 10 with yellow ASSESSMENT:  CLINICAL IMPRESSION: Pr is very tight through bilateral pectorals and bilateral trunk. ROM improved nicely after manual work and ROM activities. Cording still noted Right axilla but does not appear to interfere with ROM  . Pt will benefit from skilled therapeutic intervention to improve on the following deficits: Decreased knowledge of precautions, impaired UE functional use, pain, decreased ROM, postural dysfunction., edema   PT treatment/interventions: ADL/Self care home management, Therapeutic exercises, Therapeutic activity, Neuromuscular re-education, Patient/Family education, Self Care, Joint mobilization, Orthotic/Fit training, Dry Needling, Manual lymph drainage, scar mobilization, Manual therapy, and Re-evaluation   GOALS: Goals reviewed with patient? Yes  LONG TERM GOALS:  (STG=LTG)  GOALS Name Target Date  Goal status  1 Pt will demonstrate she has regained full shoulder ROM and function post operatively compared to baselines.  Baseline: 01/13/2023 03/31/2023 06/17/2023 ONGOING  2 Pts quick dash will be improved to pre-surgery level of 36% 03/31/2023 ONGOING  3 Pt will be able to dress and bathe and perform light household chores independently 01/13/2023 06/16/2023 MET 12/29/2022 Now slightly limited on left 03/30/2023/05/06/2023  4 Pt will have decreased pain by 50% overall 01/13/2023 06/16/23  ONGOING    5. Pt will resume usual home and work activities with minimal complaint 03/31/2023 06/17/2023 MET on right 03/30/2023 Ongoing left     PLAN:  PT FREQUENCY/DURATION:1- 2x/week x 6 weeks  PLAN FOR NEXT SESSION:  OK for ROM no wts yet, ROM, Left UE cording?,, Did she do MLD, has pulleys;STM bilateral UQ, AAROM, PROM bilateral shoulders,  ABC strength handout,MLD prn  especially to left  chest axillary region,   [facility]  [address]  [place] [address]  [phone]  After Breast Cancer Class It is recommended you attend the ABC class to be educated on lymphedema risk reduction. This class is free of charge and lasts for 1 hour. It is a 1-time class. You will need to download the Webex app either on your phone or computer. We will send you a link the night before or the morning of the class. You should be able to click on that link to join the class. This is not a confidential class. You don't have to turn your camera on, but  other participants may be able to see your email address.  Scar massage You can begin gentle scar massage to you incision sites. Gently place one hand on the incision and move the skin (without sliding on the skin) in various directions. Do this for a few minutes and then you can gently massage either coconut oil or vitamin E cream into the scars.  Compression garment You should continue wearing your compression bra until you feel like you no longer have swelling.  Home exercise Program Continue doing the exercises you were given until you feel like you can do them without feeling any tightness at the end.   Walking Program Studies show that 30 minutes of walking per day (fast enough to elevate your heart rate) can significantly reduce the risk of a cancer recurrence. If you can't walk due to other medical reasons, we encourage you to find another activity you could do (like a stationary bike or water exercise).  Posture After breast cancer surgery, people frequently sit with rounded shoulders posture because it puts their incisions on slack and feels better. If you sit like this and scar tissue forms in that position, you can become very tight and have pain sitting or standing with good posture. Try to be aware of your posture and sit and stand up tall to heal properly.  Follow up PT: It is  recommended you return every 3 months for the first 3 years following surgery to be assessed on the SOZO machine for an L-Dex score. This helps prevent clinically significant lymphedema in 95% of patients. These follow up screens are 10 minute appointments that you are not billed for.  Waynette Buttery, PT 05/11/2023, 1:02 PM

## 2023-05-14 ENCOUNTER — Ambulatory Visit: Payer: 59

## 2023-05-14 DIAGNOSIS — M25611 Stiffness of right shoulder, not elsewhere classified: Secondary | ICD-10-CM

## 2023-05-14 DIAGNOSIS — Z170 Estrogen receptor positive status [ER+]: Secondary | ICD-10-CM

## 2023-05-14 DIAGNOSIS — C50411 Malignant neoplasm of upper-outer quadrant of right female breast: Secondary | ICD-10-CM | POA: Diagnosis not present

## 2023-05-14 DIAGNOSIS — M25612 Stiffness of left shoulder, not elsewhere classified: Secondary | ICD-10-CM

## 2023-05-14 DIAGNOSIS — R293 Abnormal posture: Secondary | ICD-10-CM

## 2023-05-14 DIAGNOSIS — R222 Localized swelling, mass and lump, trunk: Secondary | ICD-10-CM

## 2023-05-14 NOTE — Therapy (Signed)
OUTPATIENT PHYSICAL THERAPY BREAST CANCER RE-EVAL   Patient Name: Elizabeth Benton MRN: [id number] DOB:[birth year], 77 y.o., female Today's Date: 05/14/2023  END OF SESSION:  PT End of Session - 05/14/23 1001     Visit Number 23    Number of Visits 31    Date for PT Re-Evaluation 06/17/23    PT Start Time 1002    PT Stop Time 1055    PT Time Calculation (min) 53 min    Activity Tolerance Patient tolerated treatment well    Behavior During Therapy WFL for tasks assessed/performed              Past Medical History:  Diagnosis Date   Arthritis    Breast cancer (HCC)    Depression    H/O migraine    HSV-2 (herpes simplex virus 2) infection    Hyperlipidemia    Hypertension    Lichen sclerosus    Migraine    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   Vaginal atrophy 12/29/2007   Vaginitis and vulvovaginitis 12/28/2004   Past Surgical History:  Procedure Laterality Date   ABDOMINAL HYSTERECTOMY  1987   BREAST RECONSTRUCTION WITH PLACEMENT OF TISSUE EXPANDER AND FLEX HD (ACELLULAR HYDRATED DERMIS) Bilateral 10/20/2022   Procedure: BREAST RECONSTRUCTION WITH PLACEMENT OF TISSUE EXPANDER AND FLEX HD (ACELLULAR HYDRATED DERMIS);  Surgeon: Janne Napoleon, MD;  Location: [facility];  Service: Plastics;  Laterality: Bilateral;   DILATION AND CURETTAGE OF UTERUS     IRRIGATION AND DEBRIDEMENT OF WOUND WITH SPLIT THICKNESS SKIN GRAFT Left 04/16/2023   Procedure: debridement of seroma cavity and placement of a drain, left breast;  Surgeon: Santiago Glad, MD;  Location: [facility];  Service: Plastics;  Laterality: Left;   MASTECTOMY W/ SENTINEL NODE BIOPSY Right 10/20/2022   Procedure: RIGHT MASTECTOMY WITH RIGHT SENTINEL LYMPH NODE BIOPSY;  Surgeon: Almond Lint, MD;  Location: [facility];  Service: General;  Laterality: Right;   REMOVAL OF BILATERAL TISSUE EXPANDERS WITH PLACEMENT OF BILATERAL BREAST IMPLANTS Bilateral 01/08/2023   Procedure: Removal of bilateral tissue expanders;   Surgeon: Santiago Glad, MD;  Location: [facility];  Service: Plastics;  Laterality: Bilateral;   TONSILLECTOMY     TOTAL MASTECTOMY Left 10/20/2022   Procedure: LEFT TOTAL MASTECTOMY;  Surgeon: Almond Lint, MD;  Location: [facility];  Service: General;  Laterality: Left;   TUBAL LIGATION     WISDOM TOOTH EXTRACTION     Patient Active Problem List   Diagnosis Date Noted   S/P breast reconstruction 10/20/2022   Genetic testing 08/24/2022   Family history of breast cancer 08/06/2022   Family history of prostate cancer 08/06/2022   Malignant neoplasm of upper-outer quadrant of right breast in female, estrogen receptor positive (HCC) 07/31/2022   Abnormal glucose level 12/05/2020   Constipation 12/05/2020   Lumbago with sciatica 12/05/2020   Osteopenia 12/05/2020   Overweight 12/05/2020   Screening for malignant neoplasm of colon 12/05/2020   Vitamin D deficiency 12/05/2020   Essential hypertension 11/30/2019   LBBB (left bundle branch block) 11/30/2019   Exertional dyspnea 11/30/2019   Lichen sclerosus 06/01/2012   Mixed hyperlipidemia    HSV-2 (herpes simplex virus 2) infection    Depression    H/O migraine    Monilia infection    Vaginal atrophy    Wears glasses 06/23/2011   Sinus problem/runny nose 06/23/2011   Mass on back 06/23/2011    PCP: Andi Devon, MD  REFERRING PROVIDER: Serena Croissant, MD  REFERRING  DIAG: Right Breast Cancer  THERAPY DIAG:  Malignant neoplasm of upper-outer quadrant of right breast in female, estrogen receptor positive (HCC)  Abnormal posture  Stiffness of left shoulder, not elsewhere classified  Stiffness of right shoulder, not elsewhere classified  Localized swelling of chest wall  Rationale for Evaluation and Treatment: Rehabilitation  ONSET DATE: 07/24/2022  SUBJECTIVE:                                                                                                                                                                                            SUBJECTIVE STATEMENT I feel better today, maybe 1/10 on the both sides.   PERTINENT HISTORY:  Patient was diagnosed on 07/24/2022 with right grade 2 Invasive Lobular Carcinoma. It measures 1.2 cm and is located in the upper-outer quadrant. It is ER 95%, PR 80% and HER 2 negative with a Ki67 of 40%. She is s/p a Bilateral Mastectomy on 10/20/2022  for Right Breast Cancer and with left prophylactic and bilateral tissue expanders .  She has since had the expanders removed  and bilateral subtotal capsulotomies on Jan. 12,2024. She had 90 cc  of fluid aspirated on 01/28/2023,and 50 cc of  fluid aspirated 02/08/2023 from the left breast area  PATIENT GOALS:  Reassess how my recovery is going related to arm function, pain, and swelling since expander removal and capsulectomies and recent I and D  PAIN:  Are you having pain?  PAIN:  Are you having pain? Yes NPRS scale: 1/10 discomfort left chest and  trunk region under armpit,  more tightness and soreness than pain, Pain location: left lateral trunk,  under arm pit on left Pain orientation: left PAIN TYPE:   Pain description: intermittent Aggravating factors: Relieving factors: compression, MLD, tumeric  PRECAUTIONS: Recent Surgery, right UE Lymphedema risk, DDD, LBP  ACTIVITY LEVEL / LEISURE: ,  not returned to work, goes back March 4   OBJECTIVE:   PATIENT SURVEYS:  QUICK DASH: Initial eval 61.36,  Re-eval  39%, 05/06/2023  52%  OBSERVATIONS: Pt with larger right breast  than left secondary to expander filled more. Mastectomy incisions are healed with glue still present. Left axillary region with area of swelling/tissue greater than right. Pt will be having an [place] today on the left. Having expanders removed in early January.  02/17/2023 Ballotable fluid noted left chest region, Right incision area irregular and folding inward. Left incision healed but also irregular without folding noted  05/06/2023:  incision healed. Drain area covered with gauze. Left side now appears more symmetrical to right side.  POSTURE:  Forward head, rounded shoulders  LYMPHEDEMA ASSESSMENT:  UPPER EXTREMITY AROM/PROM:   A/PROM RIGHT   eval   Right 12/02/2022 RIGHT 12/29/2022 RIGHT 01/05/2023 Right Re-eval RIGHT 03/02/2023 Right 03/30/2023 RIGHT 05/06/23  Shoulder extension 60 49 63   57 tight axilla 60 59 53  Flexion      128 axilla 143 150 147  Shoulder abduction 168 68 145 149 tight axilla 130 143 174 152  Shoulder internal rotation 55    60 70 70 64  Shoulder external rotation 112    98 109 110 103                          (Blank rows = not tested)   A/PROM LEFT   eval LEFT 2023 LEFT 12/29/2022 LEFT Re-eval LEFT 03/02/2023 LEFT 03/30/2023 LEFT 05/06/23  Shoulder extension 58 46 65 52 tight 60 43 51  Shoulder flexion 159 120 160 155 tight chest 160 145 120  Shoulder abduction 165 90 156 123 tight axilla 158 118 77  Shoulder internal rotation 65   55 65 60   Shoulder external rotation 112   88 107 95                           (Blank rows = not tested)     CERVICAL AROM: All within functional  limits: Decreased 20 % Rotation and 50% SB         UPPER EXTREMITY STRENGTH: WNL     LYMPHEDEMA ASSESSMENTS:    LANDMARK RIGHT   eval Right 12/02/2022 RIGHT 02/17/2023 Right 05/06/23  10 cm proximal to olecranon process 28.5 28.7 28.6 27.7  Olecranon process 24.6 24.6 24.6 24.3  10 cm proximal to ulnar styloid process 21.8 21.7 21.4 21.5  Just proximal to ulnar styloid process 16.4 16.3 16.4 16.5  Across hand at thumb web space 18.3 18.2 18.4 18.1  At base of 2nd digit 6.5 6.6 6.4 6.5  (Blank rows = not tested)   LANDMARK LEFT   eval LEFT 12/02/2022 LEFT 05/06/2023  10 cm proximal  29.3 29.7 28.1  Olecranon process  24.9 24.7 24.6  10 cm proximal to ulnar styloid process 21.0 21.4 21.1  Just proximal to ulnar styloid process 16.3 16.3 16.0  Across hand at thumb web space 18.0 17.9 17.2  At  base of 2nd digit 6.45 6.4 6.2  (Blank rows = not tested)      Surgery type/Date: 10/20/2022 bilateral Mastectomy with right SLNB, 01/08/2023 Removal of bilateral tissue expanders and bilateral capsulectomies, 04/16/2023 Left I and D Number of lymph nodes removed: 0/2 right Current/past treatment (chemo, radiation, hormone therapy): No chemo required, will start anti-estrogen therapy Other symptoms:  Heaviness/tightness Yes Pain Yes Pitting edema No Infections No Decreased scar mobility Yes Stemmer sign No  TREATMENT TODAY  05/14/2023 Supine STM to bilateral UT, Pecs, Lats with cocoa butter PROM bilateral  shoulder going into  flexion, scaption, ER and abduction with MFR at axilla to decrease soreness Cording release to right axilla MFR to bilateral chest regions AROM bilateral shoulder flexion, scaption, horizontal abd , and snow angel on half foam roll x 5-6 ea Single arm pectoral stretch in doorway x3 B, standing lat stretch at counter x 3, SB stretch bilaterally x 4 ea   05/11/2023  Supine STM to bilateral UT, Pecs, Lats with cocoa butter Supine wand flexion and scaption x 5, stargazer x 5 PROM bilateral  shoulder going into  flexion, scaption, ER and  abduction with MFR at axilla to decrease soreness MFR to bilateral chest regions AROM bilateral shoulder flexion, scaption, horizontal abd x 5  05/06/2023 Supine clasped hands flexion x 5, stargazer with assist by PT to get pt to relax x 5 Gentle PROM bilateral shoulder flexion, scaption, abd, IR and ER  04/02/2023 Supine STM to left UT, Pecs, Lats and in SL to UT and scapular area with cocoa butter PROM left shoulder going into  flexion, scaption and abduction with MFR at axilla to decrease soreness MLD to left chest area of swelling. Supraclavicular region, 5 diaphragmatic breaths, left inguinal LN's, left axillo inguinal pathway, left chest directing toward pathway multiple times  on left chest and ending with LN's.  MFR to  bilateral pectoral and rib area   03/30/2023 Supine STM to left UT, Pecs, Lats and in SL to UT and scapular area with cocoa butter PROM left shoulder going slowly into scaption and abduction with MFR at axilla to decrease soreness. AROM bilateral flexion, scaption, horizontal abd x 5 Measured bilateral AROM shoulder Hold resistance exs per MD for 2 weeks 03/18/2023 Did pulleys at home Scapular retraction yellow x 10, shoulder extension x 10 yellow, bilateral ER x 10 yellow Supine STM to left UT, Pecs, Lats with cocoa butter MFR to left axillary cording with arm resting on pillow PROM left shoulder going slowly into scaption and abduction with MFR at axilla to decrease soreness. MLD to left chest area of swelling. Supraclavicular region, 5 diaphragmatic breaths, left inguinal LN's, left axillo inguinal pathway, left chest directing toward pathway multiple times avoiding darker tender region on left chest and ending with LN's.   03/16/2023  Pulleys x 1:30 flexion, scaption, abduction Standing lat stretch x 4, Wall slides on left with pt shown how to apply pressure with right hand to rib cage area x 5 NTS for left UE with wrist oscillation Instructed pt while performing MLD to left chest area of swelling. Supraclavicular region, 5 diaphragmatic breaths, left inguinal LN's, left axillo inguinal pathway, left chest directing toward pathway multiple times avoiding darker tender region on left chest and ending with LN's. Had pt perform all steps and gave written instructions. Advised pt to keep an eye on darker area of left chest , check for heat, pain to be sure it doesn't get larger and to avoid that area with MLD. Contact MD if painful or enlarges. PROM left shoulder with MFR to left axillary area of cording for flexion, scaption, abduction.  03/11/2023  Pulleys x 1:30 flexion, scaption, abd. To warm up Standing yellow theraband x 10 scapular retraction, extension, bilateral ER STM bilateral UT,  pectorals, lats with cocoa butter PROM bilateral shoulder flexion, scaption, abduction, ER with intermittent MFR at axilla for cording Standing counter stretch x 4, standing single arm chest stretch bilaterally   03/09/2023  Pulleys x 1:30 flex, scaption, Abd Standing jobes flexion  and scaption, abd x 10 STM bilateral UT, pectorals, lats with cocoa butter PROM bilateral shoulder flexion, scaption, abduction, ER with intermittent MFR at axilla for cording MFR bilateral axillary and upper arm cording with arm in D2 flexion  Supine scapular series x 10 with yellow except sword x 5  03/04/2023 Pulleys x 1:30 flex, scaption, Abd, Ball rolls flexion x 10, bilateral abd x 5 STM bilateral UT, pectorals, lats with cocoa butter PROM bilateral shoulder flexion, scaption, abduction, ER with intermittent MFR at axilla for cording MFR bilateral chest region bilaterally Supine scapular series x 10 including sword x 5  with yellow band   03/02/2023 STM bilateral UT, pectorals, lats with cocoa butter PROM bilateral shoulder flexion, scaption, abduction, ER with intermittent MFR at axilla for cording MFR bilateral chest region bilaterally Supine scapular series x 10 except sword with yellow band Updated HEP with band exercises Measured ROM 02/26/2023  STM bilateral UT, pectorals, lats with cocoa butter PROM bilateral shoulder flexion, scaption, abduction, ER with intermittent MFR at axilla for cording MFR bilateral chest region AROM supine bilateral Flexion, scaption, horizontal abduction x 5 Corner pec stretch and doorway pecs stretch x 3-4 ea, SB stretch x 2 ea Updated HEP with stretches     PATIENT EDUCATION:   Access Code: [email or web address] URL: [email or web address] Date: 02/26/2023 Prepared by: Alvira Monday  Exercises - Supine Lower Trunk Rotation  - 2 x daily - 7 x weekly - 1 sets - 3-4 reps - 20 hold - Corner Pec Major Stretch  - 2 x daily - 7 x weekly - 1 sets - 3-4 reps - 20  hold - Single Arm Doorway Pec Stretch at 90 Degrees Abduction  - 2 x daily - 7 x weekly - 1 sets - 3-4 reps - 20 hold - TL Sidebending Stretch - Single Arm Overhead  - 2 x daily - 7 x weekly - 1 sets - 3-4 reps Education details: Reviewed 4 post op exercises. Did better with abduction sliding hand on wall Person educated: Patient Education method: Explanation and Demonstration Education comprehension: returned demonstration    HOME EXERCISE PROGRAM: Reviewed previously given post op HEP. Changed wall walk to wall slide and pt demonstrated good improvement Supine scapular series except sword x 10 with yellow ASSESSMENT:  CLINICAL IMPRESSION:  Continued STM, PROM and cording release. Initiated half foam roll for AROM , and continued stretches. Pt did very well with good form and felt great after treatment. Loved the foam roll. . Pt will benefit from skilled therapeutic intervention to improve on the following deficits: Decreased knowledge of precautions, impaired UE functional use, pain, decreased ROM, postural dysfunction., edema   PT treatment/interventions: ADL/Self care home management, Therapeutic exercises, Therapeutic activity, Neuromuscular re-education, Patient/Family education, Self Care, Joint mobilization, Orthotic/Fit training, Dry Needling, Manual lymph drainage, scar mobilization, Manual therapy, and Re-evaluation   GOALS: Goals reviewed with patient? Yes  LONG TERM GOALS:  (STG=LTG)  GOALS Name Target Date  Goal status  1 Pt will demonstrate she has regained full shoulder ROM and function post operatively compared to baselines.  Baseline: 01/13/2023 03/31/2023 06/17/2023 ONGOING  2 Pts quick dash will be improved to pre-surgery level of 36% 03/31/2023 ONGOING  3 Pt will be able to dress and bathe and perform light household chores independently 01/13/2023 06/16/2023 MET 12/29/2022 Now slightly limited on left 03/30/2023/05/06/2023  4 Pt will have decreased pain by 50%  overall 01/13/2023 06/16/23  ONGOING    5. Pt will resume usual home and work activities with minimal complaint 03/31/2023 06/17/2023 MET on right 03/30/2023 Ongoing left     PLAN:  PT FREQUENCY/DURATION:1- 2x/week x 6 weeks  PLAN FOR NEXT SESSION:  OK for ROM no wts yet,  half foam roll,ROM, right UE cording,, Did she do MLD, has pulleys;STM bilateral UQ, AAROM, PROM bilateral shoulders,  ABC strength handout,MLD prn especially to left  chest axillary region,   [facility]  [address], [address]  [place] [address]  [phone]  After Breast Cancer Class It is recommended you attend the ABC class to be educated on lymphedema risk reduction.  This class is free of charge and lasts for 1 hour. It is a 1-time class. You will need to download the Webex app either on your phone or computer. We will send you a link the night before or the morning of the class. You should be able to click on that link to join the class. This is not a confidential class. You don't have to turn your camera on, but other participants may be able to see your email address.  Scar massage You can begin gentle scar massage to you incision sites. Gently place one hand on the incision and move the skin (without sliding on the skin) in various directions. Do this for a few minutes and then you can gently massage either coconut oil or vitamin E cream into the scars.  Compression garment You should continue wearing your compression bra until you feel like you no longer have swelling.  Home exercise Program Continue doing the exercises you were given until you feel like you can do them without feeling any tightness at the end.   Walking Program Studies show that 30 minutes of walking per day (fast enough to elevate your heart rate) can significantly reduce the risk of a cancer recurrence. If you can't walk due to other medical reasons, we encourage you to find another activity you could do (like  a stationary bike or water exercise).  Posture After breast cancer surgery, people frequently sit with rounded shoulders posture because it puts their incisions on slack and feels better. If you sit like this and scar tissue forms in that position, you can become very tight and have pain sitting or standing with good posture. Try to be aware of your posture and sit and stand up tall to heal properly.  Follow up PT: It is recommended you return every 3 months for the first 3 years following surgery to be assessed on the SOZO machine for an L-Dex score. This helps prevent clinically significant lymphedema in 95% of patients. These follow up screens are 10 minute appointments that you are not billed for.  Waynette Buttery, PT 05/14/2023, 10:57 AM

## 2023-05-17 ENCOUNTER — Ambulatory Visit: Payer: 59

## 2023-05-17 DIAGNOSIS — Z170 Estrogen receptor positive status [ER+]: Secondary | ICD-10-CM

## 2023-05-17 DIAGNOSIS — R222 Localized swelling, mass and lump, trunk: Secondary | ICD-10-CM

## 2023-05-17 DIAGNOSIS — M25612 Stiffness of left shoulder, not elsewhere classified: Secondary | ICD-10-CM

## 2023-05-17 DIAGNOSIS — R293 Abnormal posture: Secondary | ICD-10-CM

## 2023-05-17 DIAGNOSIS — M25611 Stiffness of right shoulder, not elsewhere classified: Secondary | ICD-10-CM

## 2023-05-17 DIAGNOSIS — C50411 Malignant neoplasm of upper-outer quadrant of right female breast: Secondary | ICD-10-CM | POA: Diagnosis not present

## 2023-05-17 NOTE — Therapy (Signed)
OUTPATIENT PHYSICAL THERAPY BREAST CANCER RE-EVAL   Patient Name: Elizabeth Benton MRN: [id number] DOB:September 03, 1946, 77 y.o., female Today's Date: 05/17/2023  END OF SESSION:  PT End of Session - 05/17/23 0900     Visit Number 24    Number of Visits 31    Date for PT Re-Evaluation 06/17/23    PT Start Time 0901    PT Stop Time 0953    PT Time Calculation (min) 52 min    Activity Tolerance Patient tolerated treatment well    Behavior During Therapy [facility] for tasks assessed/performed              Past Medical History:  Diagnosis Date   Arthritis    Breast cancer (HCC)    Depression    H/O migraine    HSV-2 (herpes simplex virus 2) infection    Hyperlipidemia    Hypertension    Lichen sclerosus    Migraine    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   Vaginal atrophy 12/29/2007   Vaginitis and vulvovaginitis 12/28/2004   Past Surgical History:  Procedure Laterality Date   ABDOMINAL HYSTERECTOMY  1987   BREAST RECONSTRUCTION WITH PLACEMENT OF TISSUE EXPANDER AND FLEX HD (ACELLULAR HYDRATED DERMIS) Bilateral 10/20/2022   Procedure: BREAST RECONSTRUCTION WITH PLACEMENT OF TISSUE EXPANDER AND FLEX HD (ACELLULAR HYDRATED DERMIS);  Surgeon: Janne Napoleon, MD;  Location: [facility] OR;  Service: Plastics;  Laterality: Bilateral;   DILATION AND CURETTAGE OF UTERUS     IRRIGATION AND DEBRIDEMENT OF WOUND WITH SPLIT THICKNESS SKIN GRAFT Left 04/16/2023   Procedure: debridement of seroma cavity and placement of a drain, left breast;  Surgeon: Santiago Glad, MD;  Location: [facility];  Service: Plastics;  Laterality: Left;   MASTECTOMY W/ SENTINEL NODE BIOPSY Right 10/20/2022   Procedure: RIGHT MASTECTOMY WITH RIGHT SENTINEL LYMPH NODE BIOPSY;  Surgeon: Almond Lint, MD;  Location: [facility] OR;  Service: General;  Laterality: Right;   REMOVAL OF BILATERAL TISSUE EXPANDERS WITH PLACEMENT OF BILATERAL BREAST IMPLANTS Bilateral 01/08/2023   Procedure: Removal of bilateral tissue expanders;   Surgeon: Santiago Glad, MD;  Location: [facility];  Service: Plastics;  Laterality: Bilateral;   TONSILLECTOMY     TOTAL MASTECTOMY Left 10/20/2022   Procedure: LEFT TOTAL MASTECTOMY;  Surgeon: Almond Lint, MD;  Location: [facility] OR;  Service: General;  Laterality: Left;   TUBAL LIGATION     WISDOM TOOTH EXTRACTION     Patient Active Problem List   Diagnosis Date Noted   S/P breast reconstruction 10/20/2022   Genetic testing 08/24/2022   Family history of breast cancer 08/06/2022   Family history of prostate cancer 08/06/2022   Malignant neoplasm of upper-outer quadrant of right breast in female, estrogen receptor positive (HCC) 07/31/2022   Abnormal glucose level 12/05/2020   Constipation 12/05/2020   Lumbago with sciatica 12/05/2020   Osteopenia 12/05/2020   Overweight 12/05/2020   Screening for malignant neoplasm of colon 12/05/2020   Vitamin D deficiency 12/05/2020   Essential hypertension 11/30/2019   LBBB (left bundle branch block) 11/30/2019   Exertional dyspnea 11/30/2019   Lichen sclerosus 06/01/2012   Mixed hyperlipidemia    HSV-2 (herpes simplex virus 2) infection    Depression    H/O migraine    Monilia infection    Vaginal atrophy    Wears glasses 06/23/2011   Sinus problem/runny nose 06/23/2011   Mass on back 06/23/2011    PCP: Andi Devon, MD  REFERRING PROVIDER: Serena Croissant, MD  REFERRING  DIAG: Right Breast Cancer  THERAPY DIAG:  Malignant neoplasm of upper-outer quadrant of right breast in female, estrogen receptor positive (HCC)  Abnormal posture  Stiffness of left shoulder, not elsewhere classified  Stiffness of right shoulder, not elsewhere classified  Localized swelling of chest wall  Rationale for Evaluation and Treatment: Rehabilitation  ONSET DATE: 07/24/2022  SUBJECTIVE:                                                                                                                                                                                            SUBJECTIVE STATEMENT  I was sore in the right chest after last visit, but it went away by the next day. Its still a little tender. I am sore at the lateral trunk where that extra tissue is.  PERTINENT HISTORY:  Patient was diagnosed on 07/24/2022 with right grade 2 Invasive Lobular Carcinoma. It measures 1.2 cm and is located in the upper-outer quadrant. It is ER 95%, PR 80% and HER 2 negative with a Ki67 of 40%. She is s/p a Bilateral Mastectomy on 10/20/2022  for Right Breast Cancer and with left prophylactic and bilateral tissue expanders .  She has since had the expanders removed  and bilateral subtotal capsulotomies on Jan. 12,2024. She had 90 cc  of fluid aspirated on 01/28/2023,and 50 cc of  fluid aspirated 02/08/2023 from the left breast area  PATIENT GOALS:  Reassess how my recovery is going related to arm function, pain, and swelling since expander removal and capsulectomies and recent I and D  PAIN:  Are you having pain?  PAIN:  Are you having pain? Yes NPRS scale: 1-2/10 discomfort left and right chest sore and  trunk region under armpit,  more tightness and soreness than pain, Pain location: left lateral trunk,  under arm pit on left Pain orientation: left PAIN TYPE:   Pain description: intermittent Aggravating factors: Relieving factors: compression, MLD, tumeric  PRECAUTIONS: Recent Surgery, right UE Lymphedema risk, DDD, LBP  ACTIVITY LEVEL / LEISURE: ,  not returned to work, goes back March 4   OBJECTIVE:   PATIENT SURVEYS:  QUICK DASH: Initial eval 61.36,  Re-eval  39%, 05/06/2023  52%  OBSERVATIONS: Pt with larger right breast  than left secondary to expander filled more. Mastectomy incisions are healed with glue still present. Left axillary region with area of swelling/tissue greater than right. Pt will be having an [place] today on the left. Having expanders removed in early January.  02/17/2023 Ballotable fluid noted left chest  region, Right incision area irregular and folding inward. Left incision healed but also irregular without folding noted  05/06/2023:  incision healed. Drain area covered with gauze. Left side now appears more symmetrical to right side.  POSTURE:  Forward head, rounded shoulders  LYMPHEDEMA ASSESSMENT:     UPPER EXTREMITY AROM/PROM:   A/PROM RIGHT   eval   Right 12/02/2022 RIGHT 12/29/2022 RIGHT 01/05/2023 Right Re-eval RIGHT 03/02/2023 Right 03/30/2023 RIGHT 05/06/23 RIGHT 05/17/2023  Shoulder extension 60 49 63   57 tight axilla 60 59 53   Flexion      128 axilla 143 150 147 157  Shoulder abduction 168 68 145 149 tight axilla 130 143 174 152 157  Shoulder internal rotation 55    60 70 70 64   Shoulder external rotation 112    98 109 110 103                           (Blank rows = not tested)   A/PROM LEFT   eval LEFT 2023 LEFT 12/29/2022 LEFT Re-eval LEFT 03/02/2023 LEFT 03/30/2023 LEFT 05/06/23 LEFT 05/17/2023  Shoulder extension 58 46 65 52 tight 60 43 51   Shoulder flexion 159 120 160 155 tight chest 160 145 120 159  Shoulder abduction 165 90 156 123 tight axilla 158 118 77 130  Shoulder internal rotation 65   55 65 60    Shoulder external rotation 112   88 107 95                            (Blank rows = not tested)     CERVICAL AROM: All within functional  limits: Decreased 20 % Rotation and 50% SB         UPPER EXTREMITY STRENGTH: WNL     LYMPHEDEMA ASSESSMENTS:    LANDMARK RIGHT   eval Right 12/02/2022 RIGHT 02/17/2023 Right 05/06/23  10 cm proximal to olecranon process 28.5 28.7 28.6 27.7  Olecranon process 24.6 24.6 24.6 24.3  10 cm proximal to ulnar styloid process 21.8 21.7 21.4 21.5  Just proximal to ulnar styloid process 16.4 16.3 16.4 16.5  Across hand at thumb web space 18.3 18.2 18.4 18.1  At base of 2nd digit 6.5 6.6 6.4 6.5  (Blank rows = not tested)   LANDMARK LEFT   eval LEFT 12/02/2022 LEFT 05/06/2023  10 cm proximal  29.3 29.7 28.1  Olecranon  process  24.9 24.7 24.6  10 cm proximal to ulnar styloid process 21.0 21.4 21.1  Just proximal to ulnar styloid process 16.3 16.3 16.0  Across hand at thumb web space 18.0 17.9 17.2  At base of 2nd digit 6.45 6.4 6.2  (Blank rows = not tested)      Surgery type/Date: 10/20/2022 bilateral Mastectomy with right SLNB, 01/08/2023 Removal of bilateral tissue expanders and bilateral capsulectomies, 04/16/2023 Left I and D Number of lymph nodes removed: 0/2 right Current/past treatment (chemo, radiation, hormone therapy): No chemo required, will start anti-estrogen therapy Other symptoms:  Heaviness/tightness Yes Pain Yes Pitting edema No Infections No Decreased scar mobility Yes Stemmer sign No  TREATMENT TODAY  05/17/2023 Supine STM to bilateral UT, Pecs, Lats with cocoa butter PROM bilateral  shoulder going into  flexion, scaption, ER and abduction with MFR at axilla to decrease soreness Cording release to right axilla MFR to bilateral chest regions AROM bilateral shoulder flexion, scaption, horizontal abd , and snow angel on half foam roll x 5 ea   05/14/2023 Supine STM to bilateral UT, Pecs, Lats with cocoa  butter PROM bilateral  shoulder going into  flexion, scaption, ER and abduction with MFR at axilla to decrease soreness Cording release to right axilla MFR to bilateral chest regions AROM bilateral shoulder flexion, scaption, horizontal abd , and snow angel on half foam roll x 5-6 ea Single arm pectoral stretch in doorway x3 B, standing lat stretch at counter x 3, SB stretch bilaterally x 4 ea   05/11/2023  Supine STM to bilateral UT, Pecs, Lats with cocoa butter Supine wand flexion and scaption x 5, stargazer x 5 PROM bilateral  shoulder going into  flexion, scaption, ER and abduction with MFR at axilla to decrease soreness MFR to bilateral chest regions AROM bilateral shoulder flexion, scaption, horizontal abd x 5  05/06/2023 Supine clasped hands flexion x 5, stargazer  with assist by PT to get pt to relax x 5 Gentle PROM bilateral shoulder flexion, scaption, abd, IR and ER  04/02/2023 Supine STM to left UT, Pecs, Lats and in SL to UT and scapular area with cocoa butter PROM left shoulder going into  flexion, scaption and abduction with MFR at axilla to decrease soreness MLD to left chest area of swelling. Supraclavicular region, 5 diaphragmatic breaths, left inguinal LN's, left axillo inguinal pathway, left chest directing toward pathway multiple times  on left chest and ending with LN's.  MFR to bilateral pectoral and rib area   03/30/2023 Supine STM to left UT, Pecs, Lats and in SL to UT and scapular area with cocoa butter PROM left shoulder going slowly into scaption and abduction with MFR at axilla to decrease soreness. AROM bilateral flexion, scaption, horizontal abd x 5 Measured bilateral AROM shoulder Hold resistance exs per MD for 2 weeks 03/18/2023 Did pulleys at home Scapular retraction yellow x 10, shoulder extension x 10 yellow, bilateral ER x 10 yellow Supine STM to left UT, Pecs, Lats with cocoa butter MFR to left axillary cording with arm resting on pillow PROM left shoulder going slowly into scaption and abduction with MFR at axilla to decrease soreness. MLD to left chest area of swelling. Supraclavicular region, 5 diaphragmatic breaths, left inguinal LN's, left axillo inguinal pathway, left chest directing toward pathway multiple times avoiding darker tender region on left chest and ending with LN's.   03/16/2023  Pulleys x 1:30 flexion, scaption, abduction Standing lat stretch x 4, Wall slides on left with pt shown how to apply pressure with right hand to rib cage area x 5 NTS for left UE with wrist oscillation Instructed pt while performing MLD to left chest area of swelling. Supraclavicular region, 5 diaphragmatic breaths, left inguinal LN's, left axillo inguinal pathway, left chest directing toward pathway multiple times avoiding darker  tender region on left chest and ending with LN's. Had pt perform all steps and gave written instructions. Advised pt to keep an eye on darker area of left chest , check for heat, pain to be sure it doesn't get larger and to avoid that area with MLD. Contact MD if painful or enlarges. PROM left shoulder with MFR to left axillary area of cording for flexion, scaption, abduction.  03/11/2023  Pulleys x 1:30 flexion, scaption, abd. To warm up Standing yellow theraband x 10 scapular retraction, extension, bilateral ER STM bilateral UT, pectorals, lats with cocoa butter PROM bilateral shoulder flexion, scaption, abduction, ER with intermittent MFR at axilla for cording Standing counter stretch x 4, standing single arm chest stretch bilaterally   03/09/2023  Pulleys x 1:30 flex, scaption, Abd Standing jobes flexion  and scaption, abd x 10 STM bilateral UT, pectorals, lats with cocoa butter PROM bilateral shoulder flexion, scaption, abduction, ER with intermittent MFR at axilla for cording MFR bilateral axillary and upper arm cording with arm in D2 flexion  Supine scapular series x 10 with yellow except sword x 5  03/04/2023 Pulleys x 1:30 flex, scaption, Abd, Ball rolls flexion x 10, bilateral abd x 5 STM bilateral UT, pectorals, lats with cocoa butter PROM bilateral shoulder flexion, scaption, abduction, ER with intermittent MFR at axilla for cording MFR bilateral chest region bilaterally Supine scapular series x 10 including sword x 5 with yellow band   03/02/2023 STM bilateral UT, pectorals, lats with cocoa butter PROM bilateral shoulder flexion, scaption, abduction, ER with intermittent MFR at axilla for cording MFR bilateral chest region bilaterally Supine scapular series x 10 except sword with yellow band Updated HEP with band exercises Measured ROM 02/26/2023  STM bilateral UT, pectorals, lats with cocoa butter PROM bilateral shoulder flexion, scaption, abduction, ER with intermittent  MFR at axilla for cording MFR bilateral chest region AROM supine bilateral Flexion, scaption, horizontal abduction x 5 Corner pec stretch and doorway pecs stretch x 3-4 ea, SB stretch x 2 ea Updated HEP with stretches     PATIENT EDUCATION:   Access Code: [email or web address] URL: [email or web address] Date: 02/26/2023 Prepared by: Alvira Monday  Exercises - Supine Lower Trunk Rotation  - 2 x daily - 7 x weekly - 1 sets - 3-4 reps - 20 hold - Corner Pec Major Stretch  - 2 x daily - 7 x weekly - 1 sets - 3-4 reps - 20 hold - Single Arm Doorway Pec Stretch at 90 Degrees Abduction  - 2 x daily - 7 x weekly - 1 sets - 3-4 reps - 20 hold - TL Sidebending Stretch - Single Arm Overhead  - 2 x daily - 7 x weekly - 1 sets - 3-4 reps Education details: Reviewed 4 post op exercises. Did better with abduction sliding hand on wall Person educated: Patient Education method: Explanation and Demonstration Education comprehension: returned demonstration    HOME EXERCISE PROGRAM: Reviewed previously given post op HEP. Changed wall walk to wall slide and pt demonstrated good improvement Supine scapular series except sword x 10 with yellow ASSESSMENT:  CLINICAL IMPRESSION:  Pt continues with complaints of tightness/soreness in bilateral chest region. She had very good improvement in AROM of the left shoulder, and mild improvement in AROM of the right shoulder which was already better than the left.   Pt will benefit from skilled therapeutic intervention to improve on the following deficits: Decreased knowledge of precautions, impaired UE functional use, pain, decreased ROM, postural dysfunction., edema   PT treatment/interventions: ADL/Self care home management, Therapeutic exercises, Therapeutic activity, Neuromuscular re-education, Patient/Family education, Self Care, Joint mobilization, Orthotic/Fit training, Dry Needling, Manual lymph drainage, scar mobilization, Manual therapy, and  Re-evaluation   GOALS: Goals reviewed with patient? Yes  LONG TERM GOALS:  (STG=LTG)  GOALS Name Target Date  Goal status  1 Pt will demonstrate she has regained full shoulder ROM and function post operatively compared to baselines.  Baseline: 01/13/2023 03/31/2023 06/17/2023 ONGOING  2 Pts quick dash will be improved to pre-surgery level of 36% 03/31/2023 ONGOING  3 Pt will be able to dress and bathe and perform light household chores independently 01/13/2023 06/16/2023 MET 12/29/2022 Now slightly limited on left 03/30/2023/05/06/2023  4 Pt will have decreased pain by 50% overall 01/13/2023 06/16/23  ONGOING    5. Pt will  resume usual home and work activities with minimal complaint 03/31/2023 06/17/2023 MET on right 03/30/2023 Ongoing left     PLAN:  PT FREQUENCY/DURATION:1- 2x/week x 6 weeks  PLAN FOR NEXT SESSION: ABC class 5/20? OK for ROM no wts yet,  half foam roll,ROM, right UE cording,, Did she do MLD, has pulleys;STM bilateral UQ, AAROM, PROM bilateral shoulders,  ABC strength handout,MLD prn especially to left  chest axillary region,   [facility]  [address]  [place] [address]  [phone]  After Breast Cancer Class It is recommended you attend the ABC class to be educated on lymphedema risk reduction. This class is free of charge and lasts for 1 hour. It is a 1-time class. You will need to download the Webex app either on your phone or computer. We will send you a link the night before or the morning of the class. You should be able to click on that link to join the class. This is not a confidential class. You don't have to turn your camera on, but other participants may be able to see your email address.  Scar massage You can begin gentle scar massage to you incision sites. Gently place one hand on the incision and move the skin (without sliding on the skin) in various directions. Do this for a few minutes and then you can gently massage  either coconut oil or vitamin E cream into the scars.  Compression garment You should continue wearing your compression bra until you feel like you no longer have swelling.  Home exercise Program Continue doing the exercises you were given until you feel like you can do them without feeling any tightness at the end.   Walking Program Studies show that 30 minutes of walking per day (fast enough to elevate your heart rate) can significantly reduce the risk of a cancer recurrence. If you can't walk due to other medical reasons, we encourage you to find another activity you could do (like a stationary bike or water exercise).  Posture After breast cancer surgery, people frequently sit with rounded shoulders posture because it puts their incisions on slack and feels better. If you sit like this and scar tissue forms in that position, you can become very tight and have pain sitting or standing with good posture. Try to be aware of your posture and sit and stand up tall to heal properly.  Follow up PT: It is recommended you return every 3 months for the first 3 years following surgery to be assessed on the SOZO machine for an L-Dex score. This helps prevent clinically significant lymphedema in 95% of patients. These follow up screens are 10 minute appointments that you are not billed for.  Waynette Buttery, PT 05/17/2023, 9:55 AM

## 2023-05-19 ENCOUNTER — Ambulatory Visit: Payer: 59

## 2023-05-19 ENCOUNTER — Encounter: Payer: Medicare Other | Admitting: Student

## 2023-05-19 DIAGNOSIS — R222 Localized swelling, mass and lump, trunk: Secondary | ICD-10-CM

## 2023-05-19 DIAGNOSIS — M25612 Stiffness of left shoulder, not elsewhere classified: Secondary | ICD-10-CM

## 2023-05-19 DIAGNOSIS — R293 Abnormal posture: Secondary | ICD-10-CM

## 2023-05-19 DIAGNOSIS — Z170 Estrogen receptor positive status [ER+]: Secondary | ICD-10-CM

## 2023-05-19 DIAGNOSIS — C50411 Malignant neoplasm of upper-outer quadrant of right female breast: Secondary | ICD-10-CM | POA: Diagnosis not present

## 2023-05-19 DIAGNOSIS — M25611 Stiffness of right shoulder, not elsewhere classified: Secondary | ICD-10-CM

## 2023-05-19 NOTE — Progress Notes (Deleted)
Patient is a 77 year old female with history of breast cancer.  She underwent seroma cavity debridement and placement of drain with Dr. Ladona Ridgel on 04/16/2023.  Patient presents to the clinic today for postoperative follow-up.  Patient was last seen in the clinic on 05/03/2023.  At this visit, patient reported her drain was purely serous and her output was very minimal.  Her incisions were clean dry and intact and there is no evidence of seroma or recurrence.  Drain was removed at that time.  Plan is for patient to slowly increase her activities.  Plan was for patient to follow-up in 2 months or sooner if needed.  Today,

## 2023-05-19 NOTE — Therapy (Addendum)
OUTPATIENT PHYSICAL THERAPY BREAST CANCER RE-EVAL   Patient Name: Elizabeth Benton MRN: [id number] DOB:1946/12/15, 77 y.o., female Today's Date: 05/19/2023  END OF SESSION:  PT End of Session - 05/19/23 1057     Visit Number 25    Number of Visits 31    Date for PT Re-Evaluation 06/17/23    PT Start Time 1100    PT Stop Time 1151    PT Time Calculation (min) 51 min    Activity Tolerance Patient tolerated treatment well    Behavior During Therapy [facility] for tasks assessed/performed              Past Medical History:  Diagnosis Date   Arthritis    Breast cancer (HCC)    Depression    H/O migraine    HSV-2 (herpes simplex virus 2) infection    Hyperlipidemia    Hypertension    Lichen sclerosus    Migraine    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   Vaginal atrophy 12/29/2007   Vaginitis and vulvovaginitis 12/28/2004   Past Surgical History:  Procedure Laterality Date   ABDOMINAL HYSTERECTOMY  1987   BREAST RECONSTRUCTION WITH PLACEMENT OF TISSUE EXPANDER AND FLEX HD (ACELLULAR HYDRATED DERMIS) Bilateral 10/20/2022   Procedure: BREAST RECONSTRUCTION WITH PLACEMENT OF TISSUE EXPANDER AND FLEX HD (ACELLULAR HYDRATED DERMIS);  Surgeon: Janne Napoleon, MD;  Location: [facility] OR;  Service: Plastics;  Laterality: Bilateral;   DILATION AND CURETTAGE OF UTERUS     IRRIGATION AND DEBRIDEMENT OF WOUND WITH SPLIT THICKNESS SKIN GRAFT Left 04/16/2023   Procedure: debridement of seroma cavity and placement of a drain, left breast;  Surgeon: Santiago Glad, MD;  Location: [facility];  Service: Plastics;  Laterality: Left;   MASTECTOMY W/ SENTINEL NODE BIOPSY Right 10/20/2022   Procedure: RIGHT MASTECTOMY WITH RIGHT SENTINEL LYMPH NODE BIOPSY;  Surgeon: Almond Lint, MD;  Location: [facility] OR;  Service: General;  Laterality: Right;   REMOVAL OF BILATERAL TISSUE EXPANDERS WITH PLACEMENT OF BILATERAL BREAST IMPLANTS Bilateral 01/08/2023   Procedure: Removal of bilateral tissue expanders;   Surgeon: Santiago Glad, MD;  Location: [facility];  Service: Plastics;  Laterality: Bilateral;   TONSILLECTOMY     TOTAL MASTECTOMY Left 10/20/2022   Procedure: LEFT TOTAL MASTECTOMY;  Surgeon: Almond Lint, MD;  Location: [facility] OR;  Service: General;  Laterality: Left;   TUBAL LIGATION     WISDOM TOOTH EXTRACTION     Patient Active Problem List   Diagnosis Date Noted   S/P breast reconstruction 10/20/2022   Genetic testing 08/24/2022   Family history of breast cancer 08/06/2022   Family history of prostate cancer 08/06/2022   Malignant neoplasm of upper-outer quadrant of right breast in female, estrogen receptor positive (HCC) 07/31/2022   Abnormal glucose level 12/05/2020   Constipation 12/05/2020   Lumbago with sciatica 12/05/2020   Osteopenia 12/05/2020   Overweight 12/05/2020   Screening for malignant neoplasm of colon 12/05/2020   Vitamin D deficiency 12/05/2020   Essential hypertension 11/30/2019   LBBB (left bundle branch block) 11/30/2019   Exertional dyspnea 11/30/2019   Lichen sclerosus 06/01/2012   Mixed hyperlipidemia    HSV-2 (herpes simplex virus 2) infection    Depression    H/O migraine    Monilia infection    Vaginal atrophy    Wears glasses 06/23/2011   Sinus problem/runny nose 06/23/2011   Mass on back 06/23/2011    PCP: Andi Devon, MD  REFERRING PROVIDER: Serena Croissant, MD  REFERRING  DIAG: Right Breast Cancer  THERAPY DIAG:  Malignant neoplasm of upper-outer quadrant of right breast in female, estrogen receptor positive (HCC)  Abnormal posture  Stiffness of left shoulder, not elsewhere classified  Stiffness of right shoulder, not elsewhere classified  Localized swelling of chest wall  Rationale for Evaluation and Treatment: Rehabilitation  ONSET DATE: 07/24/2022  SUBJECTIVE:                                                                                                                                                                                            SUBJECTIVE STATEMENT I got a little sore after last visit. I Put on my compression bra and it felt better, but when I take it off it throbs pretty bad until I put compression back on.   PERTINENT HISTORY:  Patient was diagnosed on 07/24/2022 with right grade 2 Invasive Lobular Carcinoma. It measures 1.2 cm and is located in the upper-outer quadrant. It is ER 95%, PR 80% and HER 2 negative with a Ki67 of 40%. She is s/p a Bilateral Mastectomy on 10/20/2022  for Right Breast Cancer and with left prophylactic and bilateral tissue expanders .  She has since had the expanders removed  and bilateral subtotal capsulotomies on Jan. 12,2024. She had 90 cc  of fluid aspirated on 01/28/2023,and 50 cc of  fluid aspirated 02/08/2023 from the left breast area  PATIENT GOALS:  Reassess how my recovery is going related to arm function, pain, and swelling since expander removal and capsulectomies and recent I and D  PAIN:  Are you having pain?  PAIN:  Are you having pain? Yes NPRS scale: 1-2/10 discomfort left and right chest sore and  trunk region under armpit,  more tightness and soreness than pain, Pain location: left lateral trunk,  under arm pit on left Pain orientation: left PAIN TYPE:   Pain description: intermittent Aggravating factors: Relieving factors: compression, MLD, tumeric  PRECAUTIONS: Recent Surgery, right UE Lymphedema risk, DDD, LBP  ACTIVITY LEVEL / LEISURE: ,  not returned to work, goes back March 4   OBJECTIVE:   PATIENT SURVEYS:  QUICK DASH: Initial eval 61.36,  Re-eval  39%, 05/06/2023  52%  OBSERVATIONS: Pt with larger right breast  than left secondary to expander filled more. Mastectomy incisions are healed with glue still present. Left axillary region with area of swelling/tissue greater than right. Pt will be having an [place] today on the left. Having expanders removed in early January.  02/17/2023 Ballotable fluid noted left chest region,  Right incision area irregular and folding inward. Left incision healed but also irregular without folding noted  05/06/2023: incision  healed. Drain area covered with gauze. Left side now appears more symmetrical to right side.  POSTURE:  Forward head, rounded shoulders  LYMPHEDEMA ASSESSMENT:     UPPER EXTREMITY AROM/PROM:   A/PROM RIGHT   eval   Right 12/02/2022 RIGHT 12/29/2022 RIGHT 01/05/2023 Right Re-eval RIGHT 03/02/2023 Right 03/30/2023 RIGHT 05/06/23 RIGHT 05/17/2023  Shoulder extension 60 49 63   57 tight axilla 60 59 53   Flexion      128 axilla 143 150 147 157  Shoulder abduction 168 68 145 149 tight axilla 130 143 174 152 157  Shoulder internal rotation 55    60 70 70 64   Shoulder external rotation 112    98 109 110 103                           (Blank rows = not tested)   A/PROM LEFT   eval LEFT 2023 LEFT 12/29/2022 LEFT Re-eval LEFT 03/02/2023 LEFT 03/30/2023 LEFT 05/06/23 LEFT 05/17/2023  Shoulder extension 58 46 65 52 tight 60 43 51   Shoulder flexion 159 120 160 155 tight chest 160 145 120 159  Shoulder abduction 165 90 156 123 tight axilla 158 118 77 130  Shoulder internal rotation 65   55 65 60    Shoulder external rotation 112   88 107 95                            (Blank rows = not tested)     CERVICAL AROM: All within functional  limits: Decreased 20 % Rotation and 50% SB         UPPER EXTREMITY STRENGTH: WNL     LYMPHEDEMA ASSESSMENTS:    LANDMARK RIGHT   eval Right 12/02/2022 RIGHT 02/17/2023 Right 05/06/23  10 cm proximal to olecranon process 28.5 28.7 28.6 27.7  Olecranon process 24.6 24.6 24.6 24.3  10 cm proximal to ulnar styloid process 21.8 21.7 21.4 21.5  Just proximal to ulnar styloid process 16.4 16.3 16.4 16.5  Across hand at thumb web space 18.3 18.2 18.4 18.1  At base of 2nd digit 6.5 6.6 6.4 6.5  (Blank rows = not tested)   LANDMARK LEFT   eval LEFT 12/02/2022 LEFT 05/06/2023  10 cm proximal  29.3 29.7 28.1  Olecranon process   24.9 24.7 24.6  10 cm proximal to ulnar styloid process 21.0 21.4 21.1  Just proximal to ulnar styloid process 16.3 16.3 16.0  Across hand at thumb web space 18.0 17.9 17.2  At base of 2nd digit 6.45 6.4 6.2  (Blank rows = not tested)      Surgery type/Date: 10/20/2022 bilateral Mastectomy with right SLNB, 01/08/2023 Removal of bilateral tissue expanders and bilateral capsulectomies, 04/16/2023 Left I and D Number of lymph nodes removed: 0/2 right Current/past treatment (chemo, radiation, hormone therapy): No chemo required, will start anti-estrogen therapy Other symptoms:  Heaviness/tightness Yes Pain Yes Pitting edema No Infections No Decreased scar mobility Yes Stemmer sign No  TREATMENT TODAY 05/19/2023 Discussed Compression Bra as a possibility for more comfort and pt will go to second to [place] today Supine STM to bilateral UT, Pecs, Lats with cocoa butter PROM bilateral  shoulder going into  flexion, scaption, ER and abduction with MFR at axilla to decrease soreness Cording release to right axilla MFR to bilateral chest regions AROM bilateral shoulder flexion, scaption, horizontal abd . ( Held foam roll due to  possibly causing more soreness)  05/17/2023 Supine STM to bilateral UT, Pecs, Lats with cocoa butter PROM bilateral  shoulder going into  flexion, scaption, ER and abduction with MFR at axilla to decrease soreness Cording release to right axilla MFR to bilateral chest regions AROM bilateral shoulder flexion, scaption, horizontal abd , and snow angel on half foam roll x 5 ea   05/14/2023 Supine STM to bilateral UT, Pecs, Lats with cocoa butter PROM bilateral  shoulder going into  flexion, scaption, ER and abduction with MFR at axilla to decrease soreness Cording release to right axilla MFR to bilateral chest regions AROM bilateral shoulder flexion, scaption, horizontal abd , and snow angel on half foam roll x 5-6 ea Single arm pectoral stretch in doorway x3 B,  standing lat stretch at counter x 3, SB stretch bilaterally x 4 ea   05/11/2023  Supine STM to bilateral UT, Pecs, Lats with cocoa butter Supine wand flexion and scaption x 5, stargazer x 5 PROM bilateral  shoulder going into  flexion, scaption, ER and abduction with MFR at axilla to decrease soreness MFR to bilateral chest regions AROM bilateral shoulder flexion, scaption, horizontal abd x 5  05/06/2023 Supine clasped hands flexion x 5, stargazer with assist by PT to get pt to relax x 5 Gentle PROM bilateral shoulder flexion, scaption, abd, IR and ER  04/02/2023 Supine STM to left UT, Pecs, Lats and in SL to UT and scapular area with cocoa butter PROM left shoulder going into  flexion, scaption and abduction with MFR at axilla to decrease soreness MLD to left chest area of swelling. Supraclavicular region, 5 diaphragmatic breaths, left inguinal LN's, left axillo inguinal pathway, left chest directing toward pathway multiple times  on left chest and ending with LN's.  MFR to bilateral pectoral and rib area   03/30/2023 Supine STM to left UT, Pecs, Lats and in SL to UT and scapular area with cocoa butter PROM left shoulder going slowly into scaption and abduction with MFR at axilla to decrease soreness. AROM bilateral flexion, scaption, horizontal abd x 5 Measured bilateral AROM shoulder Hold resistance exs per MD for 2 weeks 03/18/2023 Did pulleys at home Scapular retraction yellow x 10, shoulder extension x 10 yellow, bilateral ER x 10 yellow Supine STM to left UT, Pecs, Lats with cocoa butter MFR to left axillary cording with arm resting on pillow PROM left shoulder going slowly into scaption and abduction with MFR at axilla to decrease soreness. MLD to left chest area of swelling. Supraclavicular region, 5 diaphragmatic breaths, left inguinal LN's, left axillo inguinal pathway, left chest directing toward pathway multiple times avoiding darker tender region on left chest and ending with  LN's.   03/16/2023  Pulleys x 1:30 flexion, scaption, abduction Standing lat stretch x 4, Wall slides on left with pt shown how to apply pressure with right hand to rib cage area x 5 NTS for left UE with wrist oscillation Instructed pt while performing MLD to left chest area of swelling. Supraclavicular region, 5 diaphragmatic breaths, left inguinal LN's, left axillo inguinal pathway, left chest directing toward pathway multiple times avoiding darker tender region on left chest and ending with LN's. Had pt perform all steps and gave written instructions. Advised pt to keep an eye on darker area of left chest , check for heat, pain to be sure it doesn't get larger and to avoid that area with MLD. Contact MD if painful or enlarges. PROM left shoulder with MFR to left axillary area  of cording for flexion, scaption, abduction.  03/11/2023  Pulleys x 1:30 flexion, scaption, abd. To warm up Standing yellow theraband x 10 scapular retraction, extension, bilateral ER STM bilateral UT, pectorals, lats with cocoa butter PROM bilateral shoulder flexion, scaption, abduction, ER with intermittent MFR at axilla for cording Standing counter stretch x 4, standing single arm chest stretch bilaterally   03/09/2023  Pulleys x 1:30 flex, scaption, Abd Standing jobes flexion  and scaption, abd x 10 STM bilateral UT, pectorals, lats with cocoa butter PROM bilateral shoulder flexion, scaption, abduction, ER with intermittent MFR at axilla for cording MFR bilateral axillary and upper arm cording with arm in D2 flexion  Supine scapular series x 10 with yellow except sword x 5  03/04/2023 Pulleys x 1:30 flex, scaption, Abd, Ball rolls flexion x 10, bilateral abd x 5 STM bilateral UT, pectorals, lats with cocoa butter PROM bilateral shoulder flexion, scaption, abduction, ER with intermittent MFR at axilla for cording MFR bilateral chest region bilaterally Supine scapular series x 10 including sword x 5 with yellow  band   03/02/2023 STM bilateral UT, pectorals, lats with cocoa butter PROM bilateral shoulder flexion, scaption, abduction, ER with intermittent MFR at axilla for cording MFR bilateral chest region bilaterally Supine scapular series x 10 except sword with yellow band Updated HEP with band exercises Measured ROM 02/26/2023  STM bilateral UT, pectorals, lats with cocoa butter PROM bilateral shoulder flexion, scaption, abduction, ER with intermittent MFR at axilla for cording MFR bilateral chest region AROM supine bilateral Flexion, scaption, horizontal abduction x 5 Corner pec stretch and doorway pecs stretch x 3-4 ea, SB stretch x 2 ea Updated HEP with stretches     PATIENT EDUCATION:   Access Code: [email or web address] URL: [email or web address] Date: 02/26/2023 Prepared by: Alvira Monday  Exercises - Supine Lower Trunk Rotation  - 2 x daily - 7 x weekly - 1 sets - 3-4 reps - 20 hold - Corner Pec Major Stretch  - 2 x daily - 7 x weekly - 1 sets - 3-4 reps - 20 hold - Single Arm Doorway Pec Stretch at 90 Degrees Abduction  - 2 x daily - 7 x weekly - 1 sets - 3-4 reps - 20 hold - TL Sidebending Stretch - Single Arm Overhead  - 2 x daily - 7 x weekly - 1 sets - 3-4 reps Education details: Reviewed 4 post op exercises. Did better with abduction sliding hand on wall Person educated: Patient Education method: Explanation and Demonstration Education comprehension: returned demonstration    HOME EXERCISE PROGRAM: Reviewed previously given post op HEP. Changed wall walk to wall slide and pt demonstrated good improvement Supine scapular series except sword x 10 with yellow ASSESSMENT:  CLINICAL IMPRESSION:  Pt continues with complaints of tightness/soreness in bilateral chest region. Held half foam roll to see if that may be the cause. ROM improving nicely but continued tightness in pectorals. Pt going to Second to [place] to try a compression cami  Pt will benefit from skilled  therapeutic intervention to improve on the following deficits: Decreased knowledge of precautions, impaired UE functional use, pain, decreased ROM, postural dysfunction., edema   PT treatment/interventions: ADL/Self care home management, Therapeutic exercises, Therapeutic activity, Neuromuscular re-education, Patient/Family education, Self Care, Joint mobilization, Orthotic/Fit training, Dry Needling, Manual lymph drainage, scar mobilization, Manual therapy, and Re-evaluation   GOALS: Goals reviewed with patient? Yes  LONG TERM GOALS:  (STG=LTG)  GOALS Name Target Date  Goal status  1 Pt will demonstrate she has  regained full shoulder ROM and function post operatively compared to baselines.  Baseline: 01/13/2023 03/31/2023 06/17/2023 ONGOING  2 Pts quick dash will be improved to pre-surgery level of 36% 03/31/2023 ONGOING  3 Pt will be able to dress and bathe and perform light household chores independently 01/13/2023 06/16/2023 MET 12/29/2022 Now slightly limited on left 03/30/2023/05/06/2023  4 Pt will have decreased pain by 50% overall 01/13/2023 06/16/23  ONGOING    5. Pt will resume usual home and work activities with minimal complaint 03/31/2023 06/17/2023 MET on right 03/30/2023 Ongoing left     PLAN:  PT FREQUENCY/DURATION:1- 2x/week x 6 weeks  PLAN FOR NEXT SESSION:  OK for ROM no wts yet,  hold half foam roll,ROM, right UE cording,, Did she do MLD, has pulleys;STM bilateral UQ, AAROM, PROM bilateral shoulders,  ABC strength handout,MLD prn especially to left  chest axillary region,   [facility]  [address]  [place] [address]  [phone]  After Breast Cancer Class It is recommended you attend the ABC class to be educated on lymphedema risk reduction. This class is free of charge and lasts for 1 hour. It is a 1-time class. You will need to download the Webex app either on your phone or computer. We will send you a link the night before or the  morning of the class. You should be able to click on that link to join the class. This is not a confidential class. You don't have to turn your camera on, but other participants may be able to see your email address.  Scar massage You can begin gentle scar massage to you incision sites. Gently place one hand on the incision and move the skin (without sliding on the skin) in various directions. Do this for a few minutes and then you can gently massage either coconut oil or vitamin E cream into the scars.  Compression garment You should continue wearing your compression bra until you feel like you no longer have swelling.  Home exercise Program Continue doing the exercises you were given until you feel like you can do them without feeling any tightness at the end.   Walking Program Studies show that 30 minutes of walking per day (fast enough to elevate your heart rate) can significantly reduce the risk of a cancer recurrence. If you can't walk due to other medical reasons, we encourage you to find another activity you could do (like a stationary bike or water exercise).  Posture After breast cancer surgery, people frequently sit with rounded shoulders posture because it puts their incisions on slack and feels better. If you sit like this and scar tissue forms in that position, you can become very tight and have pain sitting or standing with good posture. Try to be aware of your posture and sit and stand up tall to heal properly.  Follow up PT: It is recommended you return every 3 months for the first 3 years following surgery to be assessed on the SOZO machine for an L-Dex score. This helps prevent clinically significant lymphedema in 95% of patients. These follow up screens are 10 minute appointments that you are not billed for.  Waynette Buttery, PT 05/19/2023, 11:52 AM

## 2023-05-26 ENCOUNTER — Ambulatory Visit: Payer: 59

## 2023-05-26 DIAGNOSIS — C50411 Malignant neoplasm of upper-outer quadrant of right female breast: Secondary | ICD-10-CM | POA: Diagnosis not present

## 2023-05-26 DIAGNOSIS — R222 Localized swelling, mass and lump, trunk: Secondary | ICD-10-CM

## 2023-05-26 DIAGNOSIS — Z170 Estrogen receptor positive status [ER+]: Secondary | ICD-10-CM

## 2023-05-26 DIAGNOSIS — R293 Abnormal posture: Secondary | ICD-10-CM

## 2023-05-26 DIAGNOSIS — M25611 Stiffness of right shoulder, not elsewhere classified: Secondary | ICD-10-CM

## 2023-05-26 DIAGNOSIS — M25612 Stiffness of left shoulder, not elsewhere classified: Secondary | ICD-10-CM

## 2023-05-26 NOTE — Therapy (Addendum)
OUTPATIENT PHYSICAL THERAPY BREAST CANCER TREATMENT   Patient Name: Elizabeth Benton MRN: [id number] DOB:1946-12-15, 77 y.o., female Today's Date: 05/26/2023  END OF SESSION:  PT End of Session - 05/26/23 1103     Visit Number 26    Number of Visits 31    Date for PT Re-Evaluation 06/17/23    PT Start Time 1104    PT Stop Time 1150    PT Time Calculation (min) 46 min    Activity Tolerance Patient tolerated treatment well    Behavior During Therapy [facility] for tasks assessed/performed              Past Medical History:  Diagnosis Date   Arthritis    Breast cancer (HCC)    Depression    H/O migraine    HSV-2 (herpes simplex virus 2) infection    Hyperlipidemia    Hypertension    Lichen sclerosus    Migraine    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   Vaginal atrophy 12/29/2007   Vaginitis and vulvovaginitis 12/28/2004   Past Surgical History:  Procedure Laterality Date   ABDOMINAL HYSTERECTOMY  1987   BREAST RECONSTRUCTION WITH PLACEMENT OF TISSUE EXPANDER AND FLEX HD (ACELLULAR HYDRATED DERMIS) Bilateral 10/20/2022   Procedure: BREAST RECONSTRUCTION WITH PLACEMENT OF TISSUE EXPANDER AND FLEX HD (ACELLULAR HYDRATED DERMIS);  Surgeon: Janne Napoleon, MD;  Location: [facility] OR;  Service: Plastics;  Laterality: Bilateral;   DILATION AND CURETTAGE OF UTERUS     IRRIGATION AND DEBRIDEMENT OF WOUND WITH SPLIT THICKNESS SKIN GRAFT Left 04/16/2023   Procedure: debridement of seroma cavity and placement of a drain, left breast;  Surgeon: Santiago Glad, MD;  Location: [facility];  Service: Plastics;  Laterality: Left;   MASTECTOMY W/ SENTINEL NODE BIOPSY Right 10/20/2022   Procedure: RIGHT MASTECTOMY WITH RIGHT SENTINEL LYMPH NODE BIOPSY;  Surgeon: Almond Lint, MD;  Location: [facility] OR;  Service: General;  Laterality: Right;   REMOVAL OF BILATERAL TISSUE EXPANDERS WITH PLACEMENT OF BILATERAL BREAST IMPLANTS Bilateral 01/08/2023   Procedure: Removal of bilateral tissue expanders;   Surgeon: Santiago Glad, MD;  Location: [facility];  Service: Plastics;  Laterality: Bilateral;   TONSILLECTOMY     TOTAL MASTECTOMY Left 10/20/2022   Procedure: LEFT TOTAL MASTECTOMY;  Surgeon: Almond Lint, MD;  Location: [facility] OR;  Service: General;  Laterality: Left;   TUBAL LIGATION     WISDOM TOOTH EXTRACTION     Patient Active Problem List   Diagnosis Date Noted   S/P breast reconstruction 10/20/2022   Genetic testing 08/24/2022   Family history of breast cancer 08/06/2022   Family history of prostate cancer 08/06/2022   Malignant neoplasm of upper-outer quadrant of right breast in female, estrogen receptor positive (HCC) 07/31/2022   Abnormal glucose level 12/05/2020   Constipation 12/05/2020   Lumbago with sciatica 12/05/2020   Osteopenia 12/05/2020   Overweight 12/05/2020   Screening for malignant neoplasm of colon 12/05/2020   Vitamin D deficiency 12/05/2020   Essential hypertension 11/30/2019   LBBB (left bundle branch block) 11/30/2019   Exertional dyspnea 11/30/2019   Lichen sclerosus 06/01/2012   Mixed hyperlipidemia    HSV-2 (herpes simplex virus 2) infection    Depression    H/O migraine    Monilia infection    Vaginal atrophy    Wears glasses 06/23/2011   Sinus problem/runny nose 06/23/2011   Mass on back 06/23/2011    PCP: Andi Devon, MD  REFERRING PROVIDER: Serena Croissant, MD  REFERRING  DIAG: Right Breast Cancer  THERAPY DIAG:  Malignant neoplasm of upper-outer quadrant of right breast in female, estrogen receptor positive (HCC)  Abnormal posture  Stiffness of left shoulder, not elsewhere classified  Stiffness of right shoulder, not elsewhere classified  Localized swelling of chest wall  Rationale for Evaluation and Treatment: Rehabilitation  ONSET DATE: 07/24/2022  SUBJECTIVE:                                                                                                                                                                                            SUBJECTIVE STATEMENT I got 2 new bras by [employer]  and a prosthesis for swimming and daytime wear. I feel so good now. I am not throbbing or hurting like I was.   PERTINENT HISTORY:  Patient was diagnosed on 07/24/2022 with right grade 2 Invasive Lobular Carcinoma. It measures 1.2 cm and is located in the upper-outer quadrant. It is ER 95%, PR 80% and HER 2 negative with a Ki67 of 40%. She is s/p a Bilateral Mastectomy on 10/20/2022  for Right Breast Cancer and with left prophylactic and bilateral tissue expanders .  She has since had the expanders removed  and bilateral subtotal capsulotomies on Jan. 12,2024. She had 90 cc  of fluid aspirated on 01/28/2023,and 50 cc of  fluid aspirated 02/08/2023 from the left breast area  PATIENT GOALS:  Reassess how my recovery is going related to arm function, pain, and swelling since expander removal and capsulectomies and recent I and D  PAIN:  Are you having pain?  PAIN:  Are you having pain? Yes NPRS scale: 1/10 discomfort bilateral trunk region under armpit,  more tightness and soreness than pain, Pain location: left lateral trunk,  under arm pit on left Pain orientation: left PAIN TYPE:   Pain description: intermittent Aggravating factors: Relieving factors: compression, MLD, tumeric  PRECAUTIONS: Recent Surgery, right UE Lymphedema risk, DDD, LBP  ACTIVITY LEVEL / LEISURE: ,  not returned to work, goes back March 4   OBJECTIVE:   PATIENT SURVEYS:  QUICK DASH: Initial eval 61.36,  Re-eval  39%, 05/06/2023  52%  OBSERVATIONS: Pt with larger right breast  than left secondary to expander filled more. Mastectomy incisions are healed with glue still present. Left axillary region with area of swelling/tissue greater than right. Pt will be having an [place] today on the left. Having expanders removed in early January.  02/17/2023 Ballotable fluid noted left chest region, Right incision area irregular and folding inward.  Left incision healed but also irregular without folding noted  05/06/2023: incision healed. Drain area covered with gauze. Left side now appears  more symmetrical to right side.  POSTURE:  Forward head, rounded shoulders  LYMPHEDEMA ASSESSMENT:     UPPER EXTREMITY AROM/PROM:   A/PROM RIGHT   eval   Right 12/02/2022 RIGHT 12/29/2022 RIGHT 01/05/2023 Right Re-eval RIGHT 03/02/2023 Right 03/30/2023 RIGHT 05/06/23 RIGHT 05/17/2023  Shoulder extension 60 49 63   57 tight axilla 60 59 53   Flexion      128 axilla 143 150 147 157  Shoulder abduction 168 68 145 149 tight axilla 130 143 174 152 157  Shoulder internal rotation 55    60 70 70 64   Shoulder external rotation 112    98 109 110 103                           (Blank rows = not tested)   A/PROM LEFT   eval LEFT 2023 LEFT 12/29/2022 LEFT Re-eval LEFT 03/02/2023 LEFT 03/30/2023 LEFT 05/06/23 LEFT 05/17/2023  Shoulder extension 58 46 65 52 tight 60 43 51   Shoulder flexion 159 120 160 155 tight chest 160 145 120 159  Shoulder abduction 165 90 156 123 tight axilla 158 118 77 130  Shoulder internal rotation 65   55 65 60    Shoulder external rotation 112   88 107 95                            (Blank rows = not tested)     CERVICAL AROM: All within functional  limits: Decreased 20 % Rotation and 50% SB         UPPER EXTREMITY STRENGTH: WNL     LYMPHEDEMA ASSESSMENTS:    LANDMARK RIGHT   eval Right 12/02/2022 RIGHT 02/17/2023 Right 05/06/23  10 cm proximal to olecranon process 28.5 28.7 28.6 27.7  Olecranon process 24.6 24.6 24.6 24.3  10 cm proximal to ulnar styloid process 21.8 21.7 21.4 21.5  Just proximal to ulnar styloid process 16.4 16.3 16.4 16.5  Across hand at thumb web space 18.3 18.2 18.4 18.1  At base of 2nd digit 6.5 6.6 6.4 6.5  (Blank rows = not tested)   LANDMARK LEFT   eval LEFT 12/02/2022 LEFT 05/06/2023  10 cm proximal  29.3 29.7 28.1  Olecranon process  24.9 24.7 24.6  10 cm proximal to ulnar styloid  process 21.0 21.4 21.1  Just proximal to ulnar styloid process 16.3 16.3 16.0  Across hand at thumb web space 18.0 17.9 17.2  At base of 2nd digit 6.45 6.4 6.2  (Blank rows = not tested)      Surgery type/Date: 10/20/2022 bilateral Mastectomy with right SLNB, 01/08/2023 Removal of bilateral tissue expanders and bilateral capsulectomies, 04/16/2023 Left I and D Number of lymph nodes removed: 0/2 right Current/past treatment (chemo, radiation, hormone therapy): No chemo required, will start anti-estrogen therapy Other symptoms:  Heaviness/tightness Yes Pain Yes Pitting edema No Infections No Decreased scar mobility Yes Stemmer sign No  TREATMENT TODAY  05/26/2023 Mild redness at left chest. Pt thinks it may be from ace bandage she wears at night. Supine STM to bilateral UT, Pecs, Lats with cocoa butter PROM bilateral  shoulder going into  flexion, scaption, ER and abduction with MFR at axilla to decrease soreness Cording release to right axilla MFR to bilateral chest regions AROM bilateral shoulder flexion, scaption, horizontal abd  on half foam roll, and abd x 5 unilaterally with PT support to maintain proper plane  05/19/2023 Discussed Compression Bra as a possibility for more comfort and pt will go to second to [place] today Supine STM to bilateral UT, Pecs, Lats with cocoa butter PROM bilateral  shoulder going into  flexion, scaption, ER and abduction with MFR at axilla to decrease soreness Cording release to right axilla MFR to bilateral chest regions AROM bilateral shoulder flexion, scaption, horizontal abd . ( Held foam roll due to possibly causing more soreness)  05/17/2023 Supine STM to bilateral UT, Pecs, Lats with cocoa butter PROM bilateral  shoulder going into  flexion, scaption, ER and abduction with MFR at axilla to decrease soreness Cording release to right axilla MFR to bilateral chest regions AROM bilateral shoulder flexion, scaption, horizontal abd , and snow  angel on half foam roll x 5 ea   05/14/2023 Supine STM to bilateral UT, Pecs, Lats with cocoa butter PROM bilateral  shoulder going into  flexion, scaption, ER and abduction with MFR at axilla to decrease soreness Cording release to right axilla MFR to bilateral chest regions AROM bilateral shoulder flexion, scaption, horizontal abd , and snow angel on half foam roll x 5-6 ea Single arm pectoral stretch in doorway x3 B, standing lat stretch at counter x 3, SB stretch bilaterally x 4 ea   05/11/2023  Supine STM to bilateral UT, Pecs, Lats with cocoa butter Supine wand flexion and scaption x 5, stargazer x 5 PROM bilateral  shoulder going into  flexion, scaption, ER and abduction with MFR at axilla to decrease soreness MFR to bilateral chest regions AROM bilateral shoulder flexion, scaption, horizontal abd x 5  05/06/2023 Supine clasped hands flexion x 5, stargazer with assist by PT to get pt to relax x 5 Gentle PROM bilateral shoulder flexion, scaption, abd, IR and ER  04/02/2023 Supine STM to left UT, Pecs, Lats and in SL to UT and scapular area with cocoa butter PROM left shoulder going into  flexion, scaption and abduction with MFR at axilla to decrease soreness MLD to left chest area of swelling. Supraclavicular region, 5 diaphragmatic breaths, left inguinal LN's, left axillo inguinal pathway, left chest directing toward pathway multiple times  on left chest and ending with LN's.  MFR to bilateral pectoral and rib area   03/30/2023 Supine STM to left UT, Pecs, Lats and in SL to UT and scapular area with cocoa butter PROM left shoulder going slowly into scaption and abduction with MFR at axilla to decrease soreness. AROM bilateral flexion, scaption, horizontal abd x 5 Measured bilateral AROM shoulder Hold resistance exs per MD for 2 weeks 03/18/2023 Did pulleys at home Scapular retraction yellow x 10, shoulder extension x 10 yellow, bilateral ER x 10 yellow Supine STM to left UT,  Pecs, Lats with cocoa butter MFR to left axillary cording with arm resting on pillow PROM left shoulder going slowly into scaption and abduction with MFR at axilla to decrease soreness. MLD to left chest area of swelling. Supraclavicular region, 5 diaphragmatic breaths, left inguinal LN's, left axillo inguinal pathway, left chest directing toward pathway multiple times avoiding darker tender region on left chest and ending with LN's.   03/16/2023  Pulleys x 1:30 flexion, scaption, abduction Standing lat stretch x 4, Wall slides on left with pt shown how to apply pressure with right hand to rib cage area x 5 NTS for left UE with wrist oscillation Instructed pt while performing MLD to left chest area of swelling. Supraclavicular region, 5 diaphragmatic breaths, left inguinal LN's, left axillo inguinal pathway, left  chest directing toward pathway multiple times avoiding darker tender region on left chest and ending with LN's. Had pt perform all steps and gave written instructions. Advised pt to keep an eye on darker area of left chest , check for heat, pain to be sure it doesn't get larger and to avoid that area with MLD. Contact MD if painful or enlarges. PROM left shoulder with MFR to left axillary area of cording for flexion, scaption, abduction.  03/11/2023  Pulleys x 1:30 flexion, scaption, abd. To warm up Standing yellow theraband x 10 scapular retraction, extension, bilateral ER STM bilateral UT, pectorals, lats with cocoa butter PROM bilateral shoulder flexion, scaption, abduction, ER with intermittent MFR at axilla for cording Standing counter stretch x 4, standing single arm chest stretch bilaterally   03/09/2023  Pulleys x 1:30 flex, scaption, Abd Standing jobes flexion  and scaption, abd x 10 STM bilateral UT, pectorals, lats with cocoa butter PROM bilateral shoulder flexion, scaption, abduction, ER with intermittent MFR at axilla for cording MFR bilateral axillary and upper arm  cording with arm in D2 flexion  Supine scapular series x 10 with yellow except sword x 5  03/04/2023 Pulleys x 1:30 flex, scaption, Abd, Ball rolls flexion x 10, bilateral abd x 5 STM bilateral UT, pectorals, lats with cocoa butter PROM bilateral shoulder flexion, scaption, abduction, ER with intermittent MFR at axilla for cording MFR bilateral chest region bilaterally Supine scapular series x 10 including sword x 5 with yellow band   03/02/2023 STM bilateral UT, pectorals, lats with cocoa butter PROM bilateral shoulder flexion, scaption, abduction, ER with intermittent MFR at axilla for cording MFR bilateral chest region bilaterally Supine scapular series x 10 except sword with yellow band Updated HEP with band exercises Measured ROM 02/26/2023  STM bilateral UT, pectorals, lats with cocoa butter PROM bilateral shoulder flexion, scaption, abduction, ER with intermittent MFR at axilla for cording MFR bilateral chest region AROM supine bilateral Flexion, scaption, horizontal abduction x 5 Corner pec stretch and doorway pecs stretch x 3-4 ea, SB stretch x 2 ea Updated HEP with stretches     PATIENT EDUCATION:   Access Code: [email or web address] URL: [email or web address] Date: 02/26/2023 Prepared by: Alvira Monday  Exercises - Supine Lower Trunk Rotation  - 2 x daily - 7 x weekly - 1 sets - 3-4 reps - 20 hold - Corner Pec Major Stretch  - 2 x daily - 7 x weekly - 1 sets - 3-4 reps - 20 hold - Single Arm Doorway Pec Stretch at 90 Degrees Abduction  - 2 x daily - 7 x weekly - 1 sets - 3-4 reps - 20 hold - TL Sidebending Stretch - Single Arm Overhead  - 2 x daily - 7 x weekly - 1 sets - 3-4 reps Education details: Reviewed 4 post op exercises. Did better with abduction sliding hand on wall Person educated: Patient Education method: Explanation and Demonstration Education comprehension: returned demonstration    HOME EXERCISE PROGRAM: Reviewed previously given post op  HEP. Changed wall walk to wall slide and pt demonstrated good improvement Supine scapular series except sword x 10 with yellow ASSESSMENT:  CLINICAL IMPRESSION:  Pt is thrilled with her new bra and prosthesis purchase and she is no longer getting the throbbing pain in her chest. Pectorals still very tight and limiting abduction  Pt will benefit from skilled therapeutic intervention to improve on the following deficits: Decreased knowledge of precautions, impaired UE functional use, pain, decreased ROM, postural dysfunction., edema  PT treatment/interventions: ADL/Self care home management, Therapeutic exercises, Therapeutic activity, Neuromuscular re-education, Patient/Family education, Self Care, Joint mobilization, Orthotic/Fit training, Dry Needling, Manual lymph drainage, scar mobilization, Manual therapy, and Re-evaluation   GOALS: Goals reviewed with patient? Yes  LONG TERM GOALS:  (STG=LTG)  GOALS Name Target Date  Goal status  1 Pt will demonstrate she has regained full shoulder ROM and function post operatively compared to baselines.  Baseline: 01/13/2023 03/31/2023 06/17/2023 ONGOING  2 Pts quick dash will be improved to pre-surgery level of 36% 03/31/2023 ONGOING  3 Pt will be able to dress and bathe and perform light household chores independently 01/13/2023 06/16/2023 MET 12/29/2022 Now slightly limited on left 03/30/2023/05/06/2023  4 Pt will have decreased pain by 50% overall 01/13/2023 06/16/23  ONGOING    5. Pt will resume usual home and work activities with minimal complaint 03/31/2023 06/17/2023 MET on right 03/30/2023 Ongoing left     PLAN:  PT FREQUENCY/DURATION:1- 2x/week x 6 weeks  PLAN FOR NEXT SESSION:  try light resistance,  half foam roll,ROM, right UE cording,, Did she do MLD, has pulleys;STM bilateral UQ, AAROM, PROM bilateral shoulders,  ABC strength handout,MLD prn especially to left  chest axillary region,   [facility]  [address], [address]  [place] [address]  [phone]  After Breast Cancer Class It is recommended you attend the ABC class to be educated on lymphedema risk reduction. This class is free of charge and lasts for 1 hour. It is a 1-time class. You will need to download the Webex app either on your phone or computer. We will send you a link the night before or the morning of the class. You should be able to click on that link to join the class. This is not a confidential class. You don't have to turn your camera on, but other participants may be able to see your email address.  Scar massage You can begin gentle scar massage to you incision sites. Gently place one hand on the incision and move the skin (without sliding on the skin) in various directions. Do this for a few minutes and then you can gently massage either coconut oil or vitamin E cream into the scars.  Compression garment You should continue wearing your compression bra until you feel like you no longer have swelling.  Home exercise Program Continue doing the exercises you were given until you feel like you can do them without feeling any tightness at the end.   Walking Program Studies show that 30 minutes of walking per day (fast enough to elevate your heart rate) can significantly reduce the risk of a cancer recurrence. If you can't walk due to other medical reasons, we encourage you to find another activity you could do (like a stationary bike or water exercise).  Posture After breast cancer surgery, people frequently sit with rounded shoulders posture because it puts their incisions on slack and feels better. If you sit like this and scar tissue forms in that position, you can become very tight and have pain sitting or standing with good posture. Try to be aware of your posture and sit and stand up tall to heal properly.  Follow up PT: It is recommended you return every 3 months for the first 3 years following surgery to be assessed  on the SOZO machine for an L-Dex score. This helps prevent clinically significant lymphedema in 95% of patients. These follow up screens are 10 minute appointments that you are  not billed for.  Waynette Buttery, PT 05/26/2023, 11:55 AM

## 2023-06-01 ENCOUNTER — Ambulatory Visit: Payer: 59 | Attending: Hematology and Oncology

## 2023-06-01 DIAGNOSIS — Z170 Estrogen receptor positive status [ER+]: Secondary | ICD-10-CM | POA: Diagnosis present

## 2023-06-01 DIAGNOSIS — R222 Localized swelling, mass and lump, trunk: Secondary | ICD-10-CM | POA: Diagnosis present

## 2023-06-01 DIAGNOSIS — M25611 Stiffness of right shoulder, not elsewhere classified: Secondary | ICD-10-CM | POA: Diagnosis present

## 2023-06-01 DIAGNOSIS — R293 Abnormal posture: Secondary | ICD-10-CM | POA: Insufficient documentation

## 2023-06-01 DIAGNOSIS — M25612 Stiffness of left shoulder, not elsewhere classified: Secondary | ICD-10-CM | POA: Diagnosis present

## 2023-06-01 DIAGNOSIS — C50411 Malignant neoplasm of upper-outer quadrant of right female breast: Secondary | ICD-10-CM | POA: Diagnosis present

## 2023-06-01 NOTE — Therapy (Signed)
OUTPATIENT PHYSICAL THERAPY BREAST CANCER TREATMENT   Patient Name: Elizabeth Benton MRN: [id number] DOB:07-Mar-1946, 77 y.o., female Today's Date: 06/01/2023  END OF SESSION:  PT End of Session - 06/01/23 1000     Visit Number 27    Number of Visits 31    Date for PT Re-Evaluation 06/17/23    PT Start Time 1002    PT Stop Time 1053    PT Time Calculation (min) 51 min    Activity Tolerance Patient tolerated treatment well    Behavior During Therapy WFL for tasks assessed/performed              Past Medical History:  Diagnosis Date   Arthritis    Breast cancer (HCC)    Depression    H/O migraine    HSV-2 (herpes simplex virus 2) infection    Hyperlipidemia    Hypertension    Lichen sclerosus    Migraine    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   Vaginal atrophy 12/29/2007   Vaginitis and vulvovaginitis 12/28/2004   Past Surgical History:  Procedure Laterality Date   ABDOMINAL HYSTERECTOMY  1987   BREAST RECONSTRUCTION WITH PLACEMENT OF TISSUE EXPANDER AND FLEX HD (ACELLULAR HYDRATED DERMIS) Bilateral 10/20/2022   Procedure: BREAST RECONSTRUCTION WITH PLACEMENT OF TISSUE EXPANDER AND FLEX HD (ACELLULAR HYDRATED DERMIS);  Surgeon: Janne Napoleon, MD;  Location: [facility] OR;  Service: Plastics;  Laterality: Bilateral;   DILATION AND CURETTAGE OF UTERUS     IRRIGATION AND DEBRIDEMENT OF WOUND WITH SPLIT THICKNESS SKIN GRAFT Left 04/16/2023   Procedure: debridement of seroma cavity and placement of a drain, left breast;  Surgeon: Santiago Glad, MD;  Location: [facility];  Service: Plastics;  Laterality: Left;   MASTECTOMY W/ SENTINEL NODE BIOPSY Right 10/20/2022   Procedure: RIGHT MASTECTOMY WITH RIGHT SENTINEL LYMPH NODE BIOPSY;  Surgeon: Almond Lint, MD;  Location: [facility] OR;  Service: General;  Laterality: Right;   REMOVAL OF BILATERAL TISSUE EXPANDERS WITH PLACEMENT OF BILATERAL BREAST IMPLANTS Bilateral 01/08/2023   Procedure: Removal of bilateral tissue expanders;   Surgeon: Santiago Glad, MD;  Location:  SURGERY CENTER;  Service: Plastics;  Laterality: Bilateral;   TONSILLECTOMY     TOTAL MASTECTOMY Left 10/20/2022   Procedure: LEFT TOTAL MASTECTOMY;  Surgeon: Almond Lint, MD;  Location: [facility] OR;  Service: General;  Laterality: Left;   TUBAL LIGATION     WISDOM TOOTH EXTRACTION     Patient Active Problem List   Diagnosis Date Noted   S/P breast reconstruction 10/20/2022   Genetic testing 08/24/2022   Family history of breast cancer 08/06/2022   Family history of prostate cancer 08/06/2022   Malignant neoplasm of upper-outer quadrant of right breast in female, estrogen receptor positive (HCC) 07/31/2022   Abnormal glucose level 12/05/2020   Constipation 12/05/2020   Lumbago with sciatica 12/05/2020   Osteopenia 12/05/2020   Overweight 12/05/2020   Screening for malignant neoplasm of colon 12/05/2020   Vitamin D deficiency 12/05/2020   Essential hypertension 11/30/2019   LBBB (left bundle branch block) 11/30/2019   Exertional dyspnea 11/30/2019   Lichen sclerosus 06/01/2012   Mixed hyperlipidemia    HSV-2 (herpes simplex virus 2) infection    Depression    H/O migraine    Monilia infection    Vaginal atrophy    Wears glasses 06/23/2011   Sinus problem/runny nose 06/23/2011   Mass on back 06/23/2011    PCP: Andi Devon, MD  REFERRING PROVIDER: Serena Croissant, MD  REFERRING  DIAG: Right Breast Cancer  THERAPY DIAG:  Malignant neoplasm of upper-outer quadrant of right breast in female, estrogen receptor positive (HCC)  Abnormal posture  Stiffness of left shoulder, not elsewhere classified  Stiffness of right shoulder, not elsewhere classified  Localized swelling of chest wall  Rationale for Evaluation and Treatment: Rehabilitation  ONSET DATE: 07/24/2022  SUBJECTIVE:                                                                                                                                                                                            SUBJECTIVE STATEMENT I feel much better overall. The prosthesis feels good and I am not sleeping with the ace bandage any more.   PERTINENT HISTORY:  Patient was diagnosed on 07/24/2022 with right grade 2 Invasive Lobular Carcinoma. It measures 1.2 cm and is located in the upper-outer quadrant. It is ER 95%, PR 80% and HER 2 negative with a Ki67 of 40%. She is s/p a Bilateral Mastectomy on 10/20/2022  for Right Breast Cancer and with left prophylactic and bilateral tissue expanders .  She has since had the expanders removed  and bilateral subtotal capsulotomies on Jan. 12,2024. She had 90 cc  of fluid aspirated on 01/28/2023,and 50 cc of  fluid aspirated 02/08/2023 from the left breast area  PATIENT GOALS:  Reassess how my recovery is going related to arm function, pain, and swelling since expander removal and capsulectomies and recent I and D  PAIN:  Are you having pain?  PAIN:  Are you having pain? No, a little soreness that is improving. No problems with the foam roll last visit. NPRS scale: 1/10 discomfort bilateral trunk region under armpit,  more tightness and soreness than pain, Pain location: left lateral trunk,  under arm pit on left Pain orientation: left PAIN TYPE:   Pain description: intermittent Aggravating factors: Relieving factors: compression, MLD, tumeric  PRECAUTIONS: Recent Surgery, right UE Lymphedema risk, DDD, LBP  ACTIVITY LEVEL / LEISURE: ,  not returned to work, goes back March 4   OBJECTIVE:   PATIENT SURVEYS:  QUICK DASH: Initial eval 61.36,  Re-eval  39%, 05/06/2023  52%  OBSERVATIONS: Pt with larger right breast  than left secondary to expander filled more. Mastectomy incisions are healed with glue still present. Left axillary region with area of swelling/tissue greater than right. Pt will be having an [place] today on the left. Having expanders removed in early January.  02/17/2023 Ballotable fluid noted left chest region,  Right incision area irregular and folding inward. Left incision healed but also irregular without folding noted  05/06/2023: incision healed. Drain area covered with gauze.  Left side now appears more symmetrical to right side.  POSTURE:  Forward head, rounded shoulders  LYMPHEDEMA ASSESSMENT:     UPPER EXTREMITY AROM/PROM:   A/PROM RIGHT   eval   Right 12/02/2022 RIGHT 12/29/2022 RIGHT 01/05/2023 Right Re-eval RIGHT 03/02/2023 Right 03/30/2023 RIGHT 05/06/23 RIGHT 05/17/2023 RIGHT 06/01/23  Shoulder extension 60 49 63   57 tight axilla 60 59 53  59  Flexion      128 axilla 143 150 147 157 160  Shoulder abduction 168 68 145 149 tight axilla 130 143 174 152 157 165  Shoulder internal rotation 55    60 70 70 64  75  Shoulder external rotation 112    98 109 110 103  110                          (Blank rows = not tested)   A/PROM LEFT   eval LEFT 2023 LEFT 12/29/2022 LEFT Re-eval LEFT 03/02/2023 LEFT 03/30/2023 LEFT 05/06/23 LEFT 05/17/2023 LEFT   Shoulder extension 58 46 65 52 tight 60 43 51  65   Shoulder flexion 159 120 160 155 tight chest 160 145 120 159 165   Shoulder abduction 165 90 156 123 tight axilla 158 118 77 130 166   Shoulder internal rotation 65   55 65 60   69   Shoulder external rotation 112   88 107 95   107                           (Blank rows = not tested)     CERVICAL AROM: All within functional  limits: Decreased 20 % Rotation and 50% SB         UPPER EXTREMITY STRENGTH: WNL     LYMPHEDEMA ASSESSMENTS:    LANDMARK RIGHT   eval Right 12/02/2022 RIGHT 02/17/2023 Right 05/06/23  10 cm proximal to olecranon process 28.5 28.7 28.6 27.7  Olecranon process 24.6 24.6 24.6 24.3  10 cm proximal to ulnar styloid process 21.8 21.7 21.4 21.5  Just proximal to ulnar styloid process 16.4 16.3 16.4 16.5  Across hand at thumb web space 18.3 18.2 18.4 18.1  At base of 2nd digit 6.5 6.6 6.4 6.5  (Blank rows = not tested)   LANDMARK LEFT   eval LEFT 12/02/2022  LEFT 05/06/2023  10 cm proximal  29.3 29.7 28.1  Olecranon process  24.9 24.7 24.6  10 cm proximal to ulnar styloid process 21.0 21.4 21.1  Just proximal to ulnar styloid process 16.3 16.3 16.0  Across hand at thumb web space 18.0 17.9 17.2  At base of 2nd digit 6.45 6.4 6.2  (Blank rows = not tested)      Surgery type/Date: 10/20/2022 bilateral Mastectomy with right SLNB, 01/08/2023 Removal of bilateral tissue expanders and bilateral capsulectomies, 04/16/2023 Left I and D Number of lymph nodes removed: 0/2 right Current/past treatment (chemo, radiation, hormone therapy): No chemo required, will start anti-estrogen therapy Other symptoms:  Heaviness/tightness Yes Pain Yes Pitting edema No Infections No Decreased scar mobility Yes Stemmer sign No  TREATMENT TODAY 06/01/2023 AROM bilateral shoulder flexion, scaption, horizontal abd  on half foam roll, and abd x 5 unilaterally with PT support to maintain proper plane Half foam roll: bilateral yellow band flexion, horizontal abd, ER x 5-6 reps ea PROM bilateral shoulder flex, scaption, ER, abduction Bilateral Chest press x 10 1# Sword yellow x 6 ea  05/26/2023  Mild redness at left chest. Pt thinks it may be from ace bandage she wears at night. Supine STM to bilateral UT, Pecs, Lats with cocoa butter PROM bilateral  shoulder going into  flexion, scaption, ER and abduction with MFR at axilla to decrease soreness Cording release to right axilla MFR to bilateral chest regions AROM bilateral shoulder flexion, scaption, horizontal abd  on half foam roll, and abd x 5 unilaterally with PT support to maintain proper plane   05/19/2023 Discussed Compression Bra as a possibility for more comfort and pt will go to second to [place] today Supine STM to bilateral UT, Pecs, Lats with cocoa butter PROM bilateral  shoulder going into  flexion, scaption, ER and abduction with MFR at axilla to decrease soreness Cording release to right axilla MFR to  bilateral chest regions AROM bilateral shoulder flexion, scaption, horizontal abd . ( Held foam roll due to possibly causing more soreness)  05/17/2023 Supine STM to bilateral UT, Pecs, Lats with cocoa butter PROM bilateral  shoulder going into  flexion, scaption, ER and abduction with MFR at axilla to decrease soreness Cording release to right axilla MFR to bilateral chest regions AROM bilateral shoulder flexion, scaption, horizontal abd , and snow angel on half foam roll x 5 ea   05/14/2023 Supine STM to bilateral UT, Pecs, Lats with cocoa butter PROM bilateral  shoulder going into  flexion, scaption, ER and abduction with MFR at axilla to decrease soreness Cording release to right axilla MFR to bilateral chest regions AROM bilateral shoulder flexion, scaption, horizontal abd , and snow angel on half foam roll x 5-6 ea Single arm pectoral stretch in doorway x3 B, standing lat stretch at counter x 3, SB stretch bilaterally x 4 ea   05/11/2023  Supine STM to bilateral UT, Pecs, Lats with cocoa butter Supine wand flexion and scaption x 5, stargazer x 5 PROM bilateral  shoulder going into  flexion, scaption, ER and abduction with MFR at axilla to decrease soreness MFR to bilateral chest regions AROM bilateral shoulder flexion, scaption, horizontal abd x 5  05/06/2023 Supine clasped hands flexion x 5, stargazer with assist by PT to get pt to relax x 5 Gentle PROM bilateral shoulder flexion, scaption, abd, IR and ER  04/02/2023 Supine STM to left UT, Pecs, Lats and in SL to UT and scapular area with cocoa butter PROM left shoulder going into  flexion, scaption and abduction with MFR at axilla to decrease soreness MLD to left chest area of swelling. Supraclavicular region, 5 diaphragmatic breaths, left inguinal LN's, left axillo inguinal pathway, left chest directing toward pathway multiple times  on left chest and ending with LN's.  MFR to bilateral pectoral and rib  area   03/30/2023 Supine STM to left UT, Pecs, Lats and in SL to UT and scapular area with cocoa butter PROM left shoulder going slowly into scaption and abduction with MFR at axilla to decrease soreness. AROM bilateral flexion, scaption, horizontal abd x 5 Measured bilateral AROM shoulder Hold resistance exs per MD for 2 weeks 03/18/2023 Did pulleys at home Scapular retraction yellow x 10, shoulder extension x 10 yellow, bilateral ER x 10 yellow Supine STM to left UT, Pecs, Lats with cocoa butter MFR to left axillary cording with arm resting on pillow PROM left shoulder going slowly into scaption and abduction with MFR at axilla to decrease soreness. MLD to left chest area of swelling. Supraclavicular region, 5 diaphragmatic breaths, left inguinal LN's, left axillo inguinal pathway, left chest  directing toward pathway multiple times avoiding darker tender region on left chest and ending with LN's.   03/16/2023  Pulleys x 1:30 flexion, scaption, abduction Standing lat stretch x 4, Wall slides on left with pt shown how to apply pressure with right hand to rib cage area x 5 NTS for left UE with wrist oscillation Instructed pt while performing MLD to left chest area of swelling. Supraclavicular region, 5 diaphragmatic breaths, left inguinal LN's, left axillo inguinal pathway, left chest directing toward pathway multiple times avoiding darker tender region on left chest and ending with LN's. Had pt perform all steps and gave written instructions. Advised pt to keep an eye on darker area of left chest , check for heat, pain to be sure it doesn't get larger and to avoid that area with MLD. Contact MD if painful or enlarges. PROM left shoulder with MFR to left axillary area of cording for flexion, scaption, abduction.  03/11/2023  Pulleys x 1:30 flexion, scaption, abd. To warm up Standing yellow theraband x 10 scapular retraction, extension, bilateral ER STM bilateral UT, pectorals, lats with cocoa  butter PROM bilateral shoulder flexion, scaption, abduction, ER with intermittent MFR at axilla for cording Standing counter stretch x 4, standing single arm chest stretch bilaterally   03/09/2023  Pulleys x 1:30 flex, scaption, Abd Standing jobes flexion  and scaption, abd x 10 STM bilateral UT, pectorals, lats with cocoa butter PROM bilateral shoulder flexion, scaption, abduction, ER with intermittent MFR at axilla for cording MFR bilateral axillary and upper arm cording with arm in D2 flexion  Supine scapular series x 10 with yellow except sword x 5  03/04/2023 Pulleys x 1:30 flex, scaption, Abd, Ball rolls flexion x 10, bilateral abd x 5 STM bilateral UT, pectorals, lats with cocoa butter PROM bilateral shoulder flexion, scaption, abduction, ER with intermittent MFR at axilla for cording MFR bilateral chest region bilaterally Supine scapular series x 10 including sword x 5 with yellow band   03/02/2023 STM bilateral UT, pectorals, lats with cocoa butter PROM bilateral shoulder flexion, scaption, abduction, ER with intermittent MFR at axilla for cording MFR bilateral chest region bilaterally Supine scapular series x 10 except sword with yellow band Updated HEP with band exercises Measured ROM 02/26/2023  STM bilateral UT, pectorals, lats with cocoa butter PROM bilateral shoulder flexion, scaption, abduction, ER with intermittent MFR at axilla for cording MFR bilateral chest region AROM supine bilateral Flexion, scaption, horizontal abduction x 5 Corner pec stretch and doorway pecs stretch x 3-4 ea, SB stretch x 2 ea Updated HEP with stretches     PATIENT EDUCATION:   Access Code: [email or web address] URL: [email or web address] Date: 02/26/2023 Prepared by: Alvira Monday  Exercises - Supine Lower Trunk Rotation  - 2 x daily - 7 x weekly - 1 sets - 3-4 reps - 20 hold - Corner Pec Major Stretch  - 2 x daily - 7 x weekly - 1 sets - 3-4 reps - 20 hold - Single Arm Doorway  Pec Stretch at 90 Degrees Abduction  - 2 x daily - 7 x weekly - 1 sets - 3-4 reps - 20 hold - TL Sidebending Stretch - Single Arm Overhead  - 2 x daily - 7 x weekly - 1 sets - 3-4 reps Education details: Reviewed 4 post op exercises. Did better with abduction sliding hand on wall Person educated: Patient Education method: Explanation and Demonstration Education comprehension: returned demonstration    HOME EXERCISE PROGRAM: Reviewed previously given post op HEP. Changed wall  walk to wall slide and pt demonstrated good improvement Supine scapular series except sword x 10 with yellow ASSESSMENT:  CLINICAL IMPRESSION:  Pt has made excellent progress with ROM since last visit. She is feeling much better with her new bra and prosthesis and she has worked very hard overall. She feels she will be ready to be discharged next visit.  Pt will benefit from skilled therapeutic intervention to improve on the following deficits: Decreased knowledge of precautions, impaired UE functional use, pain, decreased ROM, postural dysfunction., edema   PT treatment/interventions: ADL/Self care home management, Therapeutic exercises, Therapeutic activity, Neuromuscular re-education, Patient/Family education, Self Care, Joint mobilization, Orthotic/Fit training, Dry Needling, Manual lymph drainage, scar mobilization, Manual therapy, and Re-evaluation   GOALS: Goals reviewed with patient? Yes  LONG TERM GOALS:  (STG=LTG)  GOALS Name Target Date  Goal status  1 Pt will demonstrate she has regained full shoulder ROM and function post operatively compared to baselines.  Baseline: 01/13/2023 03/31/2023 06/17/2023 ONGOING  2 Pts quick dash will be improved to pre-surgery level of 36% 03/31/2023 ONGOING  3 Pt will be able to dress and bathe and perform light household chores independently 01/13/2023 06/16/2023 MET 12/29/2022 Now slightly limited on left 03/30/2023/05/06/2023  4 Pt will have decreased pain by 50% overall  01/13/2023 06/16/23  MET 06/01/23  5. Pt will resume usual home and work activities with minimal complaint 03/31/2023 06/17/2023 MET on right 03/30/2023 Ongoing left     PLAN:  PT FREQUENCY/DURATION:1- 2x/week x 6 weeks  PLAN FOR NEXT SESSION:  quick dash, check goals for DC, half foam roll,ROM, right UE cording,, Did she do MLD, has pulleys;STM bilateral UQ, AAROM, PROM bilateral shoulders,  ABC strength handout,MLD prn especially to left  chest axillary region,   [facility]  [address]  [place] [address]  [phone]  After Breast Cancer Class It is recommended you attend the ABC class to be educated on lymphedema risk reduction. This class is free of charge and lasts for 1 hour. It is a 1-time class. You will need to download the Webex app either on your phone or computer. We will send you a link the night before or the morning of the class. You should be able to click on that link to join the class. This is not a confidential class. You don't have to turn your camera on, but other participants may be able to see your email address.  Scar massage You can begin gentle scar massage to you incision sites. Gently place one hand on the incision and move the skin (without sliding on the skin) in various directions. Do this for a few minutes and then you can gently massage either coconut oil or vitamin E cream into the scars.  Compression garment You should continue wearing your compression bra until you feel like you no longer have swelling.  Home exercise Program Continue doing the exercises you were given until you feel like you can do them without feeling any tightness at the end.   Walking Program Studies show that 30 minutes of walking per day (fast enough to elevate your heart rate) can significantly reduce the risk of a cancer recurrence. If you can't walk due to other medical reasons, we encourage you to find another activity you could do (like  a stationary bike or water exercise).  Posture After breast cancer surgery, people frequently sit with rounded shoulders posture because it puts their incisions on slack and feels better.  If you sit like this and scar tissue forms in that position, you can become very tight and have pain sitting or standing with good posture. Try to be aware of your posture and sit and stand up tall to heal properly.  Follow up PT: It is recommended you return every 3 months for the first 3 years following surgery to be assessed on the SOZO machine for an L-Dex score. This helps prevent clinically significant lymphedema in 95% of patients. These follow up screens are 10 minute appointments that you are not billed for.  Waynette Buttery, PT 06/01/2023, 10:56 AM

## 2023-06-03 ENCOUNTER — Ambulatory Visit: Payer: 59

## 2023-06-03 DIAGNOSIS — M25612 Stiffness of left shoulder, not elsewhere classified: Secondary | ICD-10-CM

## 2023-06-03 DIAGNOSIS — R293 Abnormal posture: Secondary | ICD-10-CM

## 2023-06-03 DIAGNOSIS — C50411 Malignant neoplasm of upper-outer quadrant of right female breast: Secondary | ICD-10-CM

## 2023-06-03 DIAGNOSIS — R222 Localized swelling, mass and lump, trunk: Secondary | ICD-10-CM

## 2023-06-03 DIAGNOSIS — M25611 Stiffness of right shoulder, not elsewhere classified: Secondary | ICD-10-CM

## 2023-06-03 NOTE — Patient Instructions (Signed)

## 2023-06-03 NOTE — Therapy (Signed)
OUTPATIENT PHYSICAL THERAPY BREAST CANCER TREATMENT   Patient Name: Elizabeth Benton MRN: [id number] DOB:[birth year], 77 y.o., female Today's Date: 06/03/2023  END OF SESSION:  PT End of Session - 06/03/23 1057     Visit Number 28    Number of Visits 31    Date for PT Re-Evaluation 06/17/23    PT Start Time 1100    PT Stop Time 1200    PT Time Calculation (min) 60 min    Activity Tolerance Patient tolerated treatment well    Behavior During Therapy [facility] for tasks assessed/performed              Past Medical History:  Diagnosis Date   Arthritis    Breast cancer (HCC)    Depression    H/O migraine    HSV-2 (herpes simplex virus 2) infection    Hyperlipidemia    Hypertension    Lichen sclerosus    Migraine    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   Vaginal atrophy 12/29/2007   Vaginitis and vulvovaginitis 12/28/2004   Past Surgical History:  Procedure Laterality Date   ABDOMINAL HYSTERECTOMY  1987   BREAST RECONSTRUCTION WITH PLACEMENT OF TISSUE EXPANDER AND FLEX HD (ACELLULAR HYDRATED DERMIS) Bilateral 10/20/2022   Procedure: BREAST RECONSTRUCTION WITH PLACEMENT OF TISSUE EXPANDER AND FLEX HD (ACELLULAR HYDRATED DERMIS);  Surgeon: Janne Napoleon, MD;  Location: [facility] OR;  Service: Plastics;  Laterality: Bilateral;   DILATION AND CURETTAGE OF UTERUS     IRRIGATION AND DEBRIDEMENT OF WOUND WITH SPLIT THICKNESS SKIN GRAFT Left 04/16/2023   Procedure: debridement of seroma cavity and placement of a drain, left breast;  Surgeon: Santiago Glad, MD;  Location: [facility];  Service: Plastics;  Laterality: Left;   MASTECTOMY W/ SENTINEL NODE BIOPSY Right 10/20/2022   Procedure: RIGHT MASTECTOMY WITH RIGHT SENTINEL LYMPH NODE BIOPSY;  Surgeon: Almond Lint, MD;  Location: [facility] OR;  Service: General;  Laterality: Right;   REMOVAL OF BILATERAL TISSUE EXPANDERS WITH PLACEMENT OF BILATERAL BREAST IMPLANTS Bilateral 01/08/2023   Procedure: Removal of bilateral tissue expanders;   Surgeon: Santiago Glad, MD;  Location: [facility];  Service: Plastics;  Laterality: Bilateral;   TONSILLECTOMY     TOTAL MASTECTOMY Left 10/20/2022   Procedure: LEFT TOTAL MASTECTOMY;  Surgeon: Almond Lint, MD;  Location: [facility] OR;  Service: General;  Laterality: Left;   TUBAL LIGATION     WISDOM TOOTH EXTRACTION     Patient Active Problem List   Diagnosis Date Noted   S/P breast reconstruction 10/20/2022   Genetic testing 08/24/2022   Family history of breast cancer 08/06/2022   Family history of prostate cancer 08/06/2022   Malignant neoplasm of upper-outer quadrant of right breast in female, estrogen receptor positive (HCC) 07/31/2022   Abnormal glucose level 12/05/2020   Constipation 12/05/2020   Lumbago with sciatica 12/05/2020   Osteopenia 12/05/2020   Overweight 12/05/2020   Screening for malignant neoplasm of colon 12/05/2020   Vitamin D deficiency 12/05/2020   Essential hypertension 11/30/2019   LBBB (left bundle branch block) 11/30/2019   Exertional dyspnea 11/30/2019   Lichen sclerosus 06/01/2012   Mixed hyperlipidemia    HSV-2 (herpes simplex virus 2) infection    Depression    H/O migraine    Monilia infection    Vaginal atrophy    Wears glasses 06/23/2011   Sinus problem/runny nose 06/23/2011   Mass on back 06/23/2011    PCP: Andi Devon, MD  REFERRING PROVIDER: Serena Croissant, MD  REFERRING  DIAG: Right Breast Cancer  THERAPY DIAG:  Malignant neoplasm of upper-outer quadrant of right breast in female, estrogen receptor positive (HCC)  Abnormal posture  Stiffness of left shoulder, not elsewhere classified  Stiffness of right shoulder, not elsewhere classified  Localized swelling of chest wall  Rationale for Evaluation and Treatment: Rehabilitation  ONSET DATE: 07/24/2022  SUBJECTIVE:                                                                                                                                                                                            SUBJECTIVE STATEMENT I can brush and wash my own hair now, I can run the vacuum cleaner, I can unload the dishwasher and place things in cabinets, but feels a little weak bringing things down or carrying heavier things. I can mop the floor without trouble. Its a little hard to get a fitted sheet on my bed. I am so much better overall.   PERTINENT HISTORY:  Patient was diagnosed on 07/24/2022 with right grade 2 Invasive Lobular Carcinoma. It measures 1.2 cm and is located in the upper-outer quadrant. It is ER 95%, PR 80% and HER 2 negative with a Ki67 of 40%. She is s/p a Bilateral Mastectomy on 10/20/2022  for Right Breast Cancer and with left prophylactic and bilateral tissue expanders .  She has since had the expanders removed  and bilateral subtotal capsulotomies on Jan. 12,2024. She had 90 cc  of fluid aspirated on 01/28/2023,and 50 cc of  fluid aspirated 02/08/2023 from the left breast area  PATIENT GOALS:  Reassess how my recovery is going related to arm function, pain, and swelling since expander removal and capsulectomies and recent I and D  PAIN:  Are you having pain?  PAIN:  Are you having pain? No, a little soreness that is improving. No problems with the foam roll last visit.   PRECAUTIONS: Recent Surgery, right UE Lymphedema risk, DDD, LBP  ACTIVITY LEVEL / LEISURE: ,  not returned to work, goes back March 4   OBJECTIVE:   PATIENT SURVEYS:  QUICK DASH: Initial eval 61.36,  Re-eval  39%, 05/06/2023  52%, 06/03/23  18%  OBSERVATIONS: Pt with larger right breast  than left secondary to expander filled more. Mastectomy incisions are healed with glue still present. Left axillary region with area of swelling/tissue greater than right. Pt will be having an [place] today on the left. Having expanders removed in early January.  02/17/2023 Ballotable fluid noted left chest region, Right incision area irregular and folding inward. Left incision healed but also  irregular without folding noted  05/06/2023: incision healed. Drain area covered  with gauze. Left side now appears more symmetrical to right side.  POSTURE:  Forward head, rounded shoulders  LYMPHEDEMA ASSESSMENT:     UPPER EXTREMITY AROM/PROM:   A/PROM RIGHT   eval   Right 12/02/2022 RIGHT 12/29/2022 RIGHT 01/05/2023 Right Re-eval RIGHT 03/02/2023 Right 03/30/2023 RIGHT 05/06/23 RIGHT 05/17/2023 RIGHT 06/01/23  Shoulder extension 60 49 63   57 tight axilla 60 59 53  59  Flexion      128 axilla 143 150 147 157 160  Shoulder abduction 168 68 145 149 tight axilla 130 143 174 152 157 165  Shoulder internal rotation 55    60 70 70 64  75  Shoulder external rotation 112    98 109 110 103  110                          (Blank rows = not tested)   A/PROM LEFT   eval LEFT 2023 LEFT 12/29/2022 LEFT Re-eval LEFT 03/02/2023 LEFT 03/30/2023 LEFT 05/06/23 LEFT 05/17/2023 LEFT   Shoulder extension 58 46 65 52 tight 60 43 51  65   Shoulder flexion 159 120 160 155 tight chest 160 145 120 159 165   Shoulder abduction 165 90 156 123 tight axilla 158 118 77 130 166   Shoulder internal rotation 65   55 65 60   69   Shoulder external rotation 112   88 107 95   107                           (Blank rows = not tested)     CERVICAL AROM: All within functional  limits: Decreased 20 % Rotation and 50% SB     SOZO screen 06/03/23  WNL     UPPER EXTREMITY STRENGTH: WNL     LYMPHEDEMA ASSESSMENTS:    LANDMARK RIGHT   eval Right 12/02/2022 RIGHT 02/17/2023 Right 05/06/23  10 cm proximal to olecranon process 28.5 28.7 28.6 27.7  Olecranon process 24.6 24.6 24.6 24.3  10 cm proximal to ulnar styloid process 21.8 21.7 21.4 21.5  Just proximal to ulnar styloid process 16.4 16.3 16.4 16.5  Across hand at thumb web space 18.3 18.2 18.4 18.1  At base of 2nd digit 6.5 6.6 6.4 6.5  (Blank rows = not tested)   LANDMARK LEFT   eval LEFT 12/02/2022 LEFT 05/06/2023  10 cm proximal  29.3 29.7 28.1  Olecranon  process  24.9 24.7 24.6  10 cm proximal to ulnar styloid process 21.0 21.4 21.1  Just proximal to ulnar styloid process 16.3 16.3 16.0  Across hand at thumb web space 18.0 17.9 17.2  At base of 2nd digit 6.45 6.4 6.2  (Blank rows = not tested)      Surgery type/Date: 10/20/2022 bilateral Mastectomy with right SLNB, 01/08/2023 Removal of bilateral tissue expanders and bilateral capsulectomies, 04/16/2023 Left I and D Number of lymph nodes removed: 0/2 right Current/past treatment (chemo, radiation, hormone therapy): No chemo required, will start anti-estrogen therapy Other symptoms:  Heaviness/tightness Yes Pain Yes Pitting edema No Infections No Decreased scar mobility Yes Stemmer sign No  TREATMENT TODAY\  06/03/23 Scapular retraction, shoulder extension, bilateral ER with yellow x 10 Standing jobes flexion x 10  0#, scaption with 1# x 8, abd to 90 deg x 10 0# Reach to shelf 1# x 10 B Supine scapular series yellow x 10 except sword x 5 Serratus punch x 10  1# B Performed SOZO screen: WNL and set up next screen  06/01/2023 AROM bilateral shoulder flexion, scaption, horizontal abd  on half foam roll, and abd x 5 unilaterally with PT support to maintain proper plane Half foam roll: bilateral yellow band flexion, horizontal abd, ER x 5-6 reps ea PROM bilateral shoulder flex, scaption, ER, abduction Bilateral Chest press x 10 1# Sword yellow x 6 ea  05/26/2023 Mild redness at left chest. Pt thinks it may be from ace bandage she wears at night. Supine STM to bilateral UT, Pecs, Lats with cocoa butter PROM bilateral  shoulder going into  flexion, scaption, ER and abduction with MFR at axilla to decrease soreness Cording release to right axilla MFR to bilateral chest regions AROM bilateral shoulder flexion, scaption, horizontal abd  on half foam roll, and abd x 5 unilaterally with PT support to maintain proper plane   05/19/2023 Discussed Compression Bra as a possibility for more  comfort and pt will go to second to Lewis today Supine STM to bilateral UT, Pecs, Lats with cocoa butter PROM bilateral  shoulder going into  flexion, scaption, ER and abduction with MFR at axilla to decrease soreness Cording release to right axilla MFR to bilateral chest regions AROM bilateral shoulder flexion, scaption, horizontal abd . ( Held foam roll due to possibly causing more soreness)  05/17/2023 Supine STM to bilateral UT, Pecs, Lats with cocoa butter PROM bilateral  shoulder going into  flexion, scaption, ER and abduction with MFR at axilla to decrease soreness Cording release to right axilla MFR to bilateral chest regions AROM bilateral shoulder flexion, scaption, horizontal abd , and snow angel on half foam roll x 5 ea   05/14/2023 Supine STM to bilateral UT, Pecs, Lats with cocoa butter PROM bilateral  shoulder going into  flexion, scaption, ER and abduction with MFR at axilla to decrease soreness Cording release to right axilla MFR to bilateral chest regions AROM bilateral shoulder flexion, scaption, horizontal abd , and snow angel on half foam roll x 5-6 ea Single arm pectoral stretch in doorway x3 B, standing lat stretch at counter x 3, SB stretch bilaterally x 4 ea   05/11/2023  Supine STM to bilateral UT, Pecs, Lats with cocoa butter Supine wand flexion and scaption x 5, stargazer x 5 PROM bilateral  shoulder going into  flexion, scaption, ER and abduction with MFR at axilla to decrease soreness MFR to bilateral chest regions AROM bilateral shoulder flexion, scaption, horizontal abd x 5  05/06/2023 Supine clasped hands flexion x 5, stargazer with assist by PT to get pt to relax x 5 Gentle PROM bilateral shoulder flexion, scaption, abd, IR and ER  04/02/2023 Supine STM to left UT, Pecs, Lats and in SL to UT and scapular area with cocoa butter PROM left shoulder going into  flexion, scaption and abduction with MFR at axilla to decrease soreness MLD to left chest area  of swelling. Supraclavicular region, 5 diaphragmatic breaths, left inguinal LN's, left axillo inguinal pathway, left chest directing toward pathway multiple times  on left chest and ending with LN's.  MFR to bilateral pectoral and rib area   03/30/2023 Supine STM to left UT, Pecs, Lats and in SL to UT and scapular area with cocoa butter PROM left shoulder going slowly into scaption and abduction with MFR at axilla to decrease soreness. AROM bilateral flexion, scaption, horizontal abd x 5 Measured bilateral AROM shoulder Hold resistance exs per MD for 2 weeks PATIENT EDUCATION:  06/03/2023: Standing SR,  ext, bilateral ER x 10 with yellow. Standing jobes x 10 flex, scaption, abd  Access Code: [email or web address] URL: [email or web address] Date: 02/26/2023 Prepared by: Alvira Monday  Exercises - Supine Lower Trunk Rotation  - 2 x daily - 7 x weekly - 1 sets - 3-4 reps - 20 hold - Corner Pec Major Stretch  - 2 x daily - 7 x weekly - 1 sets - 3-4 reps - 20 hold - Single Arm Doorway Pec Stretch at 90 Degrees Abduction  - 2 x daily - 7 x weekly - 1 sets - 3-4 reps - 20 hold - TL Sidebending Stretch - Single Arm Overhead  - 2 x daily - 7 x weekly - 1 sets - 3-4 reps Education details: Reviewed 4 post op exercises. Did better with abduction sliding hand on wall Person educated: Patient Education method: Explanation and Demonstration Education comprehension: returned demonstration    HOME EXERCISE PROGRAM: Reviewed previously given post op HEP. Changed wall walk to wall slide and pt demonstrated good improvement Supine scapular series except sword x 10 with yellow ASSESSMENT:  CLINICAL IMPRESSION:  Pt has made excellent progress with ROM and decreased pain and has achieved all goals established except for mild weakness in left upper extremity with extremes of reaching.  She is functional with all home activities and has no complaints of pain today. She does experience occasional  soreness/tightness but is feeling much better with her new bras and prostheses. She is discharged to continue with independent self management..  Pt will benefit from skilled therapeutic intervention to improve on the following deficits: Decreased knowledge of precautions, impaired UE functional use, pain, decreased ROM, postural dysfunction., edema   PT treatment/interventions: ADL/Self care home management, Therapeutic exercises, Therapeutic activity, Neuromuscular re-education, Patient/Family education, Self Care, Joint mobilization, Orthotic/Fit training, Dry Needling, Manual lymph drainage, scar mobilization, Manual therapy, and Re-evaluation   GOALS: Goals reviewed with patient? Yes  LONG TERM GOALS:  (STG=LTG)  GOALS Name Target Date  Goal status  1 Pt will demonstrate she has regained full shoulder ROM and function post operatively compared to baselines.  Baseline: 01/13/2023 03/31/2023 06/17/2023 Partially MET ROM is good but she feels some weakness in the left UE  2 Pts quick dash will be improved to pre-surgery level of 36% 03/31/2023 MET  3 Pt will be able to dress and bathe and perform light household chores independently 01/13/2023 06/16/2023 MET 12/29/2022 Now slightly limited on left 03/30/2023/05/06/2023 MET 06/03/23  4 Pt will have decreased pain by 50% overall 01/13/2023 06/16/23  MET 06/01/23  5. Pt will resume usual home and work activities with minimal complaint 03/31/2023 06/17/2023 MET on right 03/30/2023  MET 06/03/23     PLAN:  PT FREQUENCY/DURATION:No further visits set up.  PLAN FOR NEXT SESSION:   Discharge to HEP and independent self management PHYSICAL THERAPY DISCHARGE SUMMARY  Visits from Start of Care: 28  Current functional level related to goals / functional outcomes: Achieved most goals, partially achieved LTG #1   Remaining deficits: Mild weakness noticeable with extremes of reaching with left UE   Education / Equipment: Theraband/HEP   Patient  agrees to discharge. Patient goals were partially met. Patient is being discharged due to being pleased with the current functional level.  [facility]  [address]  [place] [address]  [phone]  After Breast Cancer Class It is recommended you attend the ABC class to be educated on lymphedema risk reduction. This class is free of charge  and lasts for 1 hour. It is a 1-time class. You will need to download the Webex app either on your phone or computer. We will send you a link the night before or the morning of the class. You should be able to click on that link to join the class. This is not a confidential class. You don't have to turn your camera on, but other participants may be able to see your email address.  Scar massage You can begin gentle scar massage to you incision sites. Gently place one hand on the incision and move the skin (without sliding on the skin) in various directions. Do this for a few minutes and then you can gently massage either coconut oil or vitamin E cream into the scars.  Compression garment You should continue wearing your compression bra until you feel like you no longer have swelling.  Home exercise Program Continue doing the exercises you were given until you feel like you can do them without feeling any tightness at the end.   Walking Program Studies show that 30 minutes of walking per day (fast enough to elevate your heart rate) can significantly reduce the risk of a cancer recurrence. If you can't walk due to other medical reasons, we encourage you to find another activity you could do (like a stationary bike or water exercise).  Posture After breast cancer surgery, people frequently sit with rounded shoulders posture because it puts their incisions on slack and feels better. If you sit like this and scar tissue forms in that position, you can become very tight and have pain sitting or standing with good posture. Try to  be aware of your posture and sit and stand up tall to heal properly.  Follow up PT: It is recommended you return every 3 months for the first 3 years following surgery to be assessed on the SOZO machine for an L-Dex score. This helps prevent clinically significant lymphedema in 95% of patients. These follow up screens are 10 minute appointments that you are not billed for.  Waynette Buttery, PT 06/03/2023, 12:46 PM

## 2023-07-04 ENCOUNTER — Other Ambulatory Visit: Payer: Self-pay | Admitting: Adult Health

## 2023-07-04 DIAGNOSIS — C50411 Malignant neoplasm of upper-outer quadrant of right female breast: Secondary | ICD-10-CM

## 2023-07-26 ENCOUNTER — Ambulatory Visit (INDEPENDENT_AMBULATORY_CARE_PROVIDER_SITE_OTHER): Payer: 59 | Admitting: Plastic Surgery

## 2023-07-26 VITALS — BP 144/74 | HR 74 | Wt 133.8 lb

## 2023-07-26 DIAGNOSIS — Z9889 Other specified postprocedural states: Secondary | ICD-10-CM

## 2023-07-26 DIAGNOSIS — N6489 Other specified disorders of breast: Secondary | ICD-10-CM

## 2023-07-26 NOTE — Progress Notes (Signed)
Elizabeth Benton returns today proximately 3 months after return to the operating room with removal of all of her acellular dermal matrix and washout of her wound.  She has been doing well with no complaints.  She is finished her outpatient rehab which she states was very good and has helped her with her discomfort and her range of motion in her arm.  She is now swimming again.  No specific complaints today  On examination all incisions are clean dry and intact and well-healed.  There is no evidence of seroma or infection.  She is instructed to continue her scar massage continue her range of motion activities.  She has no restrictions on her physical activity.  She will follow-up with me in 1 year.

## 2023-08-05 ENCOUNTER — Ambulatory Visit
Admission: RE | Admit: 2023-08-05 | Discharge: 2023-08-05 | Disposition: A | Discharge status: Home / Self Care | Payer: 59 | Source: Ambulatory Visit | Attending: Adult Health | Admitting: Adult Health

## 2023-08-05 DIAGNOSIS — C50411 Malignant neoplasm of upper-outer quadrant of right female breast: Secondary | ICD-10-CM

## 2023-08-10 ENCOUNTER — Telehealth: Payer: Self-pay

## 2023-08-10 ENCOUNTER — Telehealth: Payer: Self-pay | Admitting: *Deleted

## 2023-08-10 NOTE — Telephone Encounter (Signed)
No entry 

## 2023-08-10 NOTE — Telephone Encounter (Signed)
-----   Message from Noreene Filbert sent at 08/06/2023  4:19 PM EDT ----- Bone density is slightly worse.  She still however has osteopenia.  Recommend continue calcium, vitamin D, and weightbearing exercises. ----- Message ----- From: Interface, Rad Results In Sent: 08/06/2023  12:36 PM EDT To: Loa Socks, NP

## 2023-08-24 ENCOUNTER — Inpatient Hospital Stay: Payer: 59 | Attending: Hematology and Oncology | Admitting: Hematology and Oncology

## 2023-08-24 VITALS — BP 136/63 | HR 78 | Temp 97.5°F | Resp 18 | Ht <= 58 in | Wt 134.4 lb

## 2023-08-24 DIAGNOSIS — Z170 Estrogen receptor positive status [ER+]: Secondary | ICD-10-CM | POA: Diagnosis not present

## 2023-08-24 DIAGNOSIS — Z79811 Long term (current) use of aromatase inhibitors: Secondary | ICD-10-CM | POA: Diagnosis not present

## 2023-08-24 DIAGNOSIS — C50411 Malignant neoplasm of upper-outer quadrant of right female breast: Secondary | ICD-10-CM | POA: Insufficient documentation

## 2023-08-24 DIAGNOSIS — Z9012 Acquired absence of left breast and nipple: Secondary | ICD-10-CM | POA: Insufficient documentation

## 2023-08-24 NOTE — Progress Notes (Signed)
Patient Care Team: Andi Devon, MD as PCP - General (Internal Medicine) Almond Lint, MD as Consulting Physician (General Surgery) Serena Croissant, MD as Consulting Physician (Hematology and Oncology) Antony Blackbird, MD as Consulting Physician (Radiation Oncology)  DIAGNOSIS:  Encounter Diagnosis  Name Primary?   Malignant neoplasm of upper-outer quadrant of right breast in female, estrogen receptor positive (HCC) Yes    SUMMARY OF ONCOLOGIC HISTORY: Oncology History  Malignant neoplasm of upper-outer quadrant of right breast in female, estrogen receptor positive (HCC)  07/24/2022 Initial Diagnosis   Screening mammogram detected right breast asymmetry by ultrasound measured 1.2 cm at 7 o'clock position.  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  Left breast biopsy came back benign.   08/05/2022 Cancer Staging   Staging form: Breast, AJCC 8th Edition - Clinical stage from 08/05/2022: Stage IA (cT1c, cN0, cM0, G2, ER+, PR+, HER2-) - Signed by Serena Croissant, MD on 08/05/2022 Stage prefix: Initial diagnosis Histologic grading system: 3 grade system    Genetic Testing   [facility] CancerNext-Expanded Panel was Negative. Report date is 08/20/2022.  The CancerNext-Expanded gene panel offered by [facility] and includes sequencing, rearrangement, and RNA analysis for the following 77 genes: AIP, ALK, APC, ATM, AXIN2, BAP1, BARD1, BLM, BMPR1A, BRCA1, BRCA2, BRIP1, CDC73, CDH1, CDK4, CDKN1B, CDKN2A, CHEK2, CTNNA1, DICER1, FANCC, FH, FLCN, GALNT12, KIF1B, LZTR1, MAX, MEN1, MET, MLH1, MSH2, MSH3, MSH6, MUTYH, NBN, NF1, NF2, NTHL1, PALB2, PHOX2B, PMS2, POT1, PRKAR1A, PTCH1, PTEN, RAD51C, RAD51D, RB1, RECQL, RET, SDHA, SDHAF2, SDHB, SDHC, SDHD, SMAD4, SMARCA4, SMARCB1, SMARCE1, STK11, SUFU, TMEM127, TP53, TSC1, TSC2, VHL and XRCC2 (sequencing and deletion/duplication); EGFR, EGLN1, HOXB13, KIT, MITF, PDGFRA, POLD1, and POLE (sequencing only); EPCAM and  GREM1 (deletion/duplication only).    10/20/2022 Surgery   Left mastectomy: Benign, 2 benign lymph nodes Right mastectomy: Grade 3 ILC 2.1 cm, LCIS, margins negative, 0/4 lymph nodes, ER 95%, PR 80%, HER2 negative 1+, Ki-67 40%   10/2022 -  Anti-estrogen oral therapy   Letrozole x 5-7 years     CHIEF COMPLIANT: Letrozole  INTERVAL HISTORY: Elizabeth Benton is a 77 y.o. female returns for f/u after developing side effects of itching once taking Anastrozole.  She reports to the clinic for a follow-up. Patient reports a sensation that feels like pin and needles in left hand and in her feet. Lasts about 15-20 minutes and then it stops. She says it happens every other day. States that it bothers her. She also is having night sweats. She wakes up in middle of the night and have to change clothes. She says breast is kind of numb and she feels lumpy on the left side.   ALLERGIES:  is allergic to contrast media [iodinated contrast media], demerol, iodine, and anastrozole.  MEDICATIONS:  Current Outpatient Medications  Medication Sig Dispense Refill   acetaminophen (TYLENOL) 650 MG CR tablet Take 650 mg by mouth every 8 (eight) hours as needed for pain.     ALPRAZolam (XANAX) 0.25 MG tablet Take 0.25-0.5 mg by mouth every 8 (eight) hours as needed for anxiety.     amLODipine (NORVASC) 5 MG tablet Take 1.5 tablets (7.5 mg total) by mouth daily. (Patient taking differently: Take 5 mg by mouth daily.) 120 tablet 1   betamethasone dipropionate 0.05 % cream Apply 1 Application topically 2 (two) times daily as needed for rash.     Carboxymethylcellul-Glycerin (LUBRICATING EYE DROPS OP) Place 1 drop into both eyes daily as needed (dry  eyes).     gabapentin (NEURONTIN) 100 MG capsule Take 1 capsule (100 mg total) by mouth 2 (two) times daily. 60 capsule 0   HYDROcodone-acetaminophen (NORCO) 5-325 MG tablet Take 1 tablet by mouth every 6 (six) hours as needed for up to 15 doses for moderate pain. 15 tablet 0    letrozole (FEMARA) 2.5 MG tablet TAKE 1 TABLET BY MOUTH EVERY DAY 90 tablet 3   methocarbamol (ROBAXIN) 500 MG tablet Take 1 tablet (500 mg total) by mouth every 8 (eight) hours as needed for up to 15 doses for muscle spasms. 15 tablet 0   ondansetron (ZOFRAN) 4 MG tablet Take 1 tablet (4 mg total) by mouth every 8 (eight) hours as needed for up to 20 doses for nausea or vomiting. 20 tablet 0   rosuvastatin (CRESTOR) 20 MG tablet TAKE 1 TABLET BY MOUTH EVERY DAY 90 tablet 3   senna (SENOKOT) 8.6 MG TABS tablet Take 1 tablet (8.6 mg total) by mouth 2 (two) times daily. 120 tablet 0   TURMERIC PO Take by mouth.     No current facility-administered medications for this visit.    PHYSICAL EXAMINATION: ECOG PERFORMANCE STATUS: 1 - Symptomatic but completely ambulatory  Vitals:   08/24/23 1056  BP: 136/63  Pulse: 78  Resp: 18  Temp: (!) 97.5 F (36.4 C)  SpO2: 99%   Filed Weights   08/24/23 1056  Weight: 134 lb 6.4 oz (61 kg)    BREAST: No palpable lumps nodules in bilateral chest wall or axilla.  Scar tissue from prior breast surgeries noted.  (exam performed in the presence of a chaperone)  LABORATORY DATA:  I have reviewed the data as listed    Latest Ref Rng & Units 10/21/2022    5:19 AM 10/13/2022    3:50 PM 08/05/2022   12:28 PM  CMP  Glucose 70 - 99 mg/dL 027  253  664   BUN 8 - 23 mg/dL 6  11  11    Creatinine 0.44 - 1.00 mg/dL 4.03  4.74  2.59   Sodium 135 - 145 mmol/L 134  139  139   Potassium 3.5 - 5.1 mmol/L 4.6  3.7  3.9   Chloride 98 - 111 mmol/L 105  107  107   CO2 22 - 32 mmol/L 22  24  26    Calcium 8.9 - 10.3 mg/dL 8.7  9.3  9.5   Total Protein 6.5 - 8.1 g/dL   7.5   Total Bilirubin 0.3 - 1.2 mg/dL   0.4   Alkaline Phos 38 - 126 U/L   62   AST 15 - 41 U/L   20   ALT 0 - 44 U/L   19     Lab Results  Component Value Date   WBC 9.7 10/22/2022   HGB 10.2 (L) 10/22/2022   HCT 32.6 (L) 10/22/2022   MCV 87.2 10/22/2022   PLT 186 10/22/2022   NEUTROABS  5.6 08/05/2022    ASSESSMENT & PLAN:  Malignant neoplasm of upper-outer quadrant of right breast in female, estrogen receptor positive (HCC) 07/24/2022: Screening mammogram detected right breast asymmetry by ultrasound measured 1.2 cm at 7 o'clock position.  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  Left breast biopsy came back benign.    10/20/2022:Left mastectomy: Benign, 2 benign lymph nodes Right mastectomy: Grade 3 ILC 2.1 cm, LCIS, margins negative, 0/4 lymph nodes, ER 95%, PR 80%, HER2 negative 1+,  Ki-67 40%   Treatment plan: Oncotype DX: 17 (risk of recurrence 5%) Ajuvant antiestrogen therapy with anastrozole 1 mg daily started 11/11/2022 switched to letrozole July 2024  Letrozole toxicities: Hot flashes and night sweats: Instructed to take it at bedtime Joint stiffness when she wakes up Osteopenia: T-score -2.31 July 2023: Encouraged her to consider bisphosphonate therapy but she wants to do it by exercise vitamin D and calcium.  Breast cancer surveillance: Breast exam 08/24/2023: Benign No role of imaging since she had bilateral mastectomies  Return to clinic in 1 year for follow-up    No orders of the defined types were placed in this encounter.  The patient has a good understanding of the overall plan. she agrees with it. she will call with any problems that may develop before the next visit here. Total time spent: 30 mins including face to face time and time spent for planning, charting and co-ordination of care   Tamsen Meek, MD 08/24/23    I Janan Ridge am acting as a Neurosurgeon for [employer]  I have reviewed the above documentation for accuracy and completeness, and I agree with the above.

## 2023-08-24 NOTE — Assessment & Plan Note (Addendum)
07/24/2022: Screening mammogram detected right breast asymmetry by ultrasound measured 1.2 cm at 7 o'clock position.  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  Left breast biopsy came back benign.    10/20/2022:Left mastectomy: Benign, 2 benign lymph nodes Right mastectomy: Grade 3 ILC 2.1 cm, LCIS, margins negative, 0/4 lymph nodes, ER 95%, PR 80%, HER2 negative 1+, Ki-67 40%   Treatment plan: Oncotype DX: 17 (risk of recurrence 5%) Ajuvant antiestrogen therapy with anastrozole 1 mg daily started 11/11/2022 switched to letrozole July 2024  Letrozole toxicities: Hot flashes and night sweats: Instructed to take it at bedtime Joint stiffness when she wakes up Osteopenia: T-score -2.31 July 2023: Encouraged her to consider bisphosphonate therapy but she wants to do it by exercise vitamin D and calcium.  Breast cancer surveillance: Breast exam 08/24/2023: Benign No role of imaging since she had bilateral mastectomies  Return to clinic in 1 year for follow-up

## 2023-08-31 ENCOUNTER — Telehealth: Payer: Self-pay | Admitting: Hematology and Oncology

## 2023-08-31 NOTE — Telephone Encounter (Signed)
Scheduled appointment per 8/27 los. Left voicemail with appointment details.

## 2023-09-02 NOTE — Therapy (Signed)
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   Vaginal atrophy 12/29/2007   Vaginitis and vulvovaginitis 12/28/2004   Past Surgical History:  Procedure Laterality Date   ABDOMINAL HYSTERECTOMY  1987   BREAST RECONSTRUCTION WITH PLACEMENT OF TISSUE EXPANDER AND FLEX HD (ACELLULAR HYDRATED DERMIS) Bilateral 10/20/2022   Procedure: BREAST RECONSTRUCTION WITH PLACEMENT OF TISSUE EXPANDER AND FLEX HD (ACELLULAR HYDRATED DERMIS);  Surgeon: Janne Napoleon, MD;  Location: [facility] OR;  Service: Plastics;  Laterality: Bilateral;   DILATION AND CURETTAGE OF UTERUS     IRRIGATION AND DEBRIDEMENT OF WOUND WITH SPLIT THICKNESS SKIN GRAFT Left 04/16/2023   Procedure: debridement of seroma cavity and placement of a drain, left breast;  Surgeon: Santiago Glad, MD;  Location: [facility];  Service: Plastics;  Laterality: Left;   MASTECTOMY W/ SENTINEL NODE BIOPSY Right 10/20/2022   Procedure: RIGHT MASTECTOMY WITH RIGHT SENTINEL LYMPH NODE BIOPSY;  Surgeon: Almond Lint, MD;  Location: [facility] OR;  Service: General;  Laterality: Right;   REMOVAL OF BILATERAL TISSUE EXPANDERS WITH PLACEMENT OF BILATERAL BREAST IMPLANTS Bilateral 01/08/2023   Procedure: Removal of bilateral tissue expanders;  Surgeon: Santiago Glad, MD;  Location: [facility];  Service: Plastics;  Laterality: Bilateral;   TONSILLECTOMY     TOTAL MASTECTOMY Left 10/20/2022   Procedure: LEFT TOTAL MASTECTOMY;  Surgeon: Almond Lint, MD;  Location: [facility] OR;  Service: General;  Laterality: Left;    TUBAL LIGATION     WISDOM TOOTH EXTRACTION     Patient Active Problem List   Diagnosis Date Noted   S/P breast reconstruction 10/20/2022   Genetic testing 08/24/2022   Family history of breast cancer 08/06/2022   Family history of prostate cancer 08/06/2022   Malignant neoplasm of upper-outer quadrant of right breast in female, estrogen receptor positive (HCC) 07/31/2022   Abnormal glucose level 12/05/2020   Constipation 12/05/2020   Lumbago with sciatica 12/05/2020   Osteopenia 12/05/2020   Overweight 12/05/2020   Screening for malignant neoplasm of colon 12/05/2020   Vitamin D deficiency 12/05/2020   Essential hypertension 11/30/2019   LBBB (left bundle branch block) 11/30/2019   Exertional dyspnea 11/30/2019   Lichen sclerosus 06/01/2012   Mixed hyperlipidemia    HSV-2 (herpes simplex virus 2) infection    Depression    H/O migraine    Monilia infection    Vaginal atrophy    Wears glasses 06/23/2011   Sinus problem/runny nose 06/23/2011   Mass on back 06/23/2011    REFERRING DIAG: right breast cancer   THERAPY DIAG:  No diagnosis found.  PERTINENT HISTORY:   Patient was diagnosed on 07/24/2022 with right grade 2 Invasive Lobular Carcinoma. It measures 1.2 cm and is located in the upper-outer quadrant. It is ER 95%, PR 80% and HER 2 negative with a Ki67 of 40%. She is s/p a Bilateral Mastectomy on 10/20/2022  for Right Breast Cancer and with left prophylactic and bilateral tissue expanders .  She has since had the expanders removed  and  bilateral subtotal capsulotomies on Jan. 12,2024. She had 90 cc  of fluid aspirated on 01/28/2023,and 50 cc of  fluid aspirated 02/08/2023 from the left breast area  PRECAUTIONS:  Right UE Lymphedema risk,   SUBJECTIVE: Doing great  PAIN:  Are you having pain? No  SOZO SCREENING: Patient was assessed today using the SOZO machine to determine the lymphedema index score. This was compared to her baseline score. It was determined that she  is within the recommended range when compared to her baseline and no further action is needed at this time. She will continue SOZO screenings. These are done every 3 months for 2 years post operatively followed by every 6 months for 2 years, and then annually.     Waynette Buttery, PT 09/02/2023, 4:42 PM

## 2023-09-03 ENCOUNTER — Ambulatory Visit: Payer: 59 | Attending: Hematology and Oncology

## 2023-09-03 DIAGNOSIS — C50411 Malignant neoplasm of upper-outer quadrant of right female breast: Secondary | ICD-10-CM | POA: Insufficient documentation

## 2023-09-03 DIAGNOSIS — R293 Abnormal posture: Secondary | ICD-10-CM | POA: Insufficient documentation

## 2023-09-03 DIAGNOSIS — Z170 Estrogen receptor positive status [ER+]: Secondary | ICD-10-CM | POA: Insufficient documentation

## 2023-11-15 ENCOUNTER — Telehealth: Payer: Self-pay

## 2023-11-15 NOTE — Telephone Encounter (Signed)
Pt called and states she has noticed since starting letrozole, her legs have been "unsteady". She reports falling and states her legs have been "puffy and heavy feeling". She states her last dose was 11/15 but she is not interested in moving forward with taking anymore.  She was advised per MD to stop letrozole and she was offered [facility] visit with Mardella Layman, NP for 12/07/23 at 1415 and she accepted this appt. She knows to call with any concerns in the interim.

## 2023-12-01 ENCOUNTER — Telehealth: Payer: Self-pay | Admitting: Adult Health

## 2023-12-01 NOTE — Telephone Encounter (Signed)
Left patient a message in regards to scheduled appointment times/dates for follow up appointment due to provider being out of office on 12/07/2023

## 2023-12-06 ENCOUNTER — Ambulatory Visit: Payer: 59

## 2023-12-06 ENCOUNTER — Telehealth: Payer: Self-pay | Admitting: Adult Health

## 2023-12-06 NOTE — Telephone Encounter (Signed)
Spoke with patient confirming upcoming appointment changes 

## 2023-12-07 ENCOUNTER — Encounter: Payer: Medicare Other | Admitting: Adult Health

## 2023-12-09 ENCOUNTER — Inpatient Hospital Stay: Payer: 59 | Attending: Adult Health | Admitting: Adult Health

## 2023-12-09 ENCOUNTER — Encounter: Payer: Medicare Other | Admitting: Adult Health

## 2023-12-09 ENCOUNTER — Encounter: Payer: Self-pay | Admitting: Adult Health

## 2023-12-09 VITALS — BP 145/69 | HR 85 | Temp 97.9°F | Resp 16 | Wt 139.2 lb

## 2023-12-09 DIAGNOSIS — Z87891 Personal history of nicotine dependence: Secondary | ICD-10-CM | POA: Diagnosis not present

## 2023-12-09 DIAGNOSIS — M8580 Other specified disorders of bone density and structure, unspecified site: Secondary | ICD-10-CM | POA: Diagnosis not present

## 2023-12-09 DIAGNOSIS — Z79811 Long term (current) use of aromatase inhibitors: Secondary | ICD-10-CM | POA: Insufficient documentation

## 2023-12-09 DIAGNOSIS — Z9013 Acquired absence of bilateral breasts and nipples: Secondary | ICD-10-CM | POA: Insufficient documentation

## 2023-12-09 DIAGNOSIS — Z170 Estrogen receptor positive status [ER+]: Secondary | ICD-10-CM | POA: Insufficient documentation

## 2023-12-09 DIAGNOSIS — C50411 Malignant neoplasm of upper-outer quadrant of right female breast: Secondary | ICD-10-CM | POA: Diagnosis not present

## 2023-12-09 MED ORDER — EXEMESTANE 25 MG PO TABS: 25.0000 mg | ORAL_TABLET | Freq: Every day | ORAL | 1 refills | Status: DC

## 2023-12-09 NOTE — Assessment & Plan Note (Signed)
07/24/2022: Screening mammogram detected right breast asymmetry by ultrasound measured 1.2 cm at 7 o'clock position.  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  Left breast biopsy came back benign.    10/20/2022:Left mastectomy: Benign, 2 benign lymph nodes Right mastectomy: Grade 3 ILC 2.1 cm, LCIS, margins negative, 0/4 lymph nodes, ER 95%, PR 80%, HER2 negative 1+, Ki-67 40%   Treatment plan: Oncotype DX: 17 (risk of recurrence 5%) Ajuvant antiestrogen therapy with anastrozole 1 mg daily started 11/11/2022 switched to letrozole July 2024; switched to Exemestane 11/2023  Osteopenia: T-score -2.31 July 2023: Encouraged her to consider bisphosphonate therapy but she wants to do it by exercise vitamin D and calcium.  Breast cancer surveillance: Breast exam 08/24/2023: Benign No role of imaging since she had bilateral mastectomies  Adverse effects of Letrozole Patient reports significant discomfort, including muscle cramps, falls, and a general feeling of accelerated aging. Stopped medication a month ago and reports improvement in symptoms. -Plan to trial a different aromatase inhibitor, Exemestane, to assess for tolerability. -Start after the holiday season per patient's preference. -If similar side effects occur, patient to discontinue immediately.  Post-mastectomy discomfort Patient reports ongoing soreness and discomfort in the chest and underarm area following multiple surgeries, including fluid drainage. -Encourage patient to follow up with plastic surgeon to address these issues. -Consider non-binding clothing options for comfort.  Follow-up Patient to report back on the tolerability of Exemestane and the outcome of the consultation with the plastic surgeon.

## 2023-12-09 NOTE — Progress Notes (Signed)
[facility] Cancer Follow up:    Andi Devon, MD [address] [place] [address]   DIAGNOSIS:  Cancer Staging  Malignant neoplasm of upper-outer quadrant of right breast in female, estrogen receptor positive (HCC) Staging form: Breast, AJCC 8th Edition - Clinical stage from 08/05/2022: Stage IA (cT1c, cN0, cM0, G2, ER+, PR+, HER2-) - Signed by Serena Croissant, MD on 08/05/2022 Stage prefix: Initial diagnosis Histologic grading system: 3 grade system   SUMMARY OF ONCOLOGIC HISTORY: Oncology History  Malignant neoplasm of upper-outer quadrant of right breast in female, estrogen receptor positive (HCC)  07/24/2022 Initial Diagnosis   Screening mammogram detected right breast asymmetry by ultrasound measured 1.2 cm at 7 o'clock position.  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  Left breast biopsy came back benign.   08/05/2022 Cancer Staging   Staging form: Breast, AJCC 8th Edition - Clinical stage from 08/05/2022: Stage IA (cT1c, cN0, cM0, G2, ER+, PR+, HER2-) - Signed by Serena Croissant, MD on 08/05/2022 Stage prefix: Initial diagnosis Histologic grading system: 3 grade system    Genetic Testing   [employer] CancerNext-Expanded Panel was Negative. Report date is 08/20/2022.  The CancerNext-Expanded gene panel offered by [facility] and includes sequencing, rearrangement, and RNA analysis for the following 77 genes: AIP, ALK, APC, ATM, AXIN2, BAP1, BARD1, BLM, BMPR1A, BRCA1, BRCA2, BRIP1, CDC73, CDH1, CDK4, CDKN1B, CDKN2A, CHEK2, CTNNA1, DICER1, FANCC, FH, FLCN, GALNT12, KIF1B, LZTR1, MAX, MEN1, MET, MLH1, MSH2, MSH3, MSH6, MUTYH, NBN, NF1, NF2, NTHL1, PALB2, PHOX2B, PMS2, POT1, PRKAR1A, PTCH1, PTEN, RAD51C, RAD51D, RB1, RECQL, RET, SDHA, SDHAF2, SDHB, SDHC, SDHD, SMAD4, SMARCA4, SMARCB1, SMARCE1, STK11, SUFU, TMEM127, TP53, TSC1, TSC2, VHL and XRCC2 (sequencing and deletion/duplication); EGFR, EGLN1, HOXB13, KIT,  MITF, PDGFRA, POLD1, and POLE (sequencing only); EPCAM and GREM1 (deletion/duplication only).    10/20/2022 Surgery   Left mastectomy: Benign, 2 benign lymph nodes Right mastectomy: Grade 3 ILC 2.1 cm, LCIS, margins negative, 0/4 lymph nodes, ER 95%, PR 80%, HER2 negative 1+, Ki-67 40%   10/2022 -  Anti-estrogen oral therapy   Letrozole x 5-7 years     CURRENT THERAPY: Letrozole  INTERVAL HISTORY:  Discussed the use of AI scribe software for clinical note transcription with the patient, who gave verbal consent to proceed.  Clearance [name] 77 y.o. female returns for evalaution of side effects of her letrozole.  Per nursing note on 11/15/2023,  "Pt called and states she has noticed since starting letrozole, her legs have been "unsteady". She reports falling and states her legs have been "puffy and heavy feeling". She states her last dose was 11/15 but she is not interested in moving forward with taking anymore. She was advised per MD to stop letrozole and she was offered [facility] visit with Mardella Layman, NP for 12/07/23 at 1415 and she accepted this appt. She knows to call with any concerns in the interim."  She describes a rapid aging sensation, postural changes, and generalized body aches extending from the neck to the lower extremities. The most distressing symptom was severe leg cramps, which were difficult to localize and unresponsive to home remedies. The cramps led to two falls within a short period, one of which left the patient unable to rise for approximately 20 minutes.  Following these incidents, the patient decided to discontinue the medication about a month ago. Since then, she reports feeling significantly better, with no further falls or cramps.   The patient also reports  residual post-surgical discomfort from recent breast surgeries. She describes a sensation of a "breath" or bulge in the upper arm and persistent soreness under the arm. She has attempted various topical remedies for  relief, but the discomfort persists, affecting her ability to wear certain clothing items comfortably.  The patient is open to trying another medication but is concerned about potential side effects and her impact on her quality of life. She expresses a strong desire to maintain her current improved state of health and to avoid any medication that might cause a decline.   Patient Active Problem List   Diagnosis Date Noted   S/P breast reconstruction 10/20/2022   Genetic testing 08/24/2022   Family history of breast cancer 08/06/2022   Family history of prostate cancer 08/06/2022   Malignant neoplasm of upper-outer quadrant of right breast in female, estrogen receptor positive (HCC) 07/31/2022   Abnormal glucose level 12/05/2020   Constipation 12/05/2020   Lumbago with sciatica 12/05/2020   Osteopenia 12/05/2020   Overweight 12/05/2020   Screening for malignant neoplasm of colon 12/05/2020   Vitamin D deficiency 12/05/2020   Essential hypertension 11/30/2019   LBBB (left bundle branch block) 11/30/2019   Exertional dyspnea 11/30/2019   Lichen sclerosus 06/01/2012   Mixed hyperlipidemia    HSV-2 (herpes simplex virus 2) infection    Depression    H/O migraine    Monilia infection    Vaginal atrophy    Wears glasses 06/23/2011   Sinus problem/runny nose 06/23/2011   Mass on back 06/23/2011    is allergic to contrast media [iodinated contrast media], demerol, iodine, and anastrozole.  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   Vaginal atrophy 12/29/2007   Vaginitis and vulvovaginitis 12/28/2004    SURGICAL HISTORY: Past Surgical History:  Procedure Laterality Date   ABDOMINAL HYSTERECTOMY  1987   BREAST RECONSTRUCTION WITH PLACEMENT OF TISSUE  EXPANDER AND FLEX HD (ACELLULAR HYDRATED DERMIS) Bilateral 10/20/2022   Procedure: BREAST RECONSTRUCTION WITH PLACEMENT OF TISSUE EXPANDER AND FLEX HD (ACELLULAR HYDRATED DERMIS);  Surgeon: Janne Napoleon, MD;  Location: [facility] OR;  Service: Plastics;  Laterality: Bilateral;   DILATION AND CURETTAGE OF UTERUS     IRRIGATION AND DEBRIDEMENT OF WOUND WITH SPLIT THICKNESS SKIN GRAFT Left 04/16/2023   Procedure: debridement of seroma cavity and placement of a drain, left breast;  Surgeon: Santiago Glad, MD;  Location: [facility];  Service: Plastics;  Laterality: Left;   MASTECTOMY W/ SENTINEL NODE BIOPSY Right 10/20/2022   Procedure: RIGHT MASTECTOMY WITH RIGHT SENTINEL LYMPH NODE BIOPSY;  Surgeon: Almond Lint, MD;  Location: [facility] OR;  Service: General;  Laterality: Right;   REMOVAL OF BILATERAL TISSUE EXPANDERS WITH PLACEMENT OF BILATERAL BREAST IMPLANTS Bilateral 01/08/2023   Procedure: Removal of bilateral tissue expanders;  Surgeon: Santiago Glad, MD;  Location: [facility];  Service: Plastics;  Laterality: Bilateral;   TONSILLECTOMY     TOTAL MASTECTOMY Left 10/20/2022   Procedure: LEFT TOTAL MASTECTOMY;  Surgeon: Almond Lint, MD;  Location: [facility] OR;  Service: General;  Laterality: Left;   TUBAL LIGATION     WISDOM TOOTH EXTRACTION      SOCIAL HISTORY: Social History   Socioeconomic  History   Marital status: Married    Spouse name: Nalijah Nastri   Number of children: 2   Years of education: Not on file   Highest education level: Not on file  Occupational History   Not on file  Tobacco Use   Smoking status: Former    Current packs/day: 0.00    Average packs/day: 0.5 packs/day for 40.0 years (20.0 ttl pk-yrs)    Types: Cigarettes    Start date: 01/22/1957    Quit date: 01/22/1997    Years since quitting: 26.8   Smokeless tobacco: Never  Vaping Use   Vaping status: Never Used  Substance and Sexual Activity   Alcohol use: Yes    Alcohol/week: 1.0  standard drink of alcohol    Types: 1 Standard drinks or equivalent per week    Comment: occ   Drug use: No   Sexual activity: Not Currently    Birth control/protection: Post-menopausal    Comment: hyst  Other Topics Concern   Not on file  Social History Narrative   Not on file   Social Drivers of Health   Financial Resource Strain: Not on file  Food Insecurity: Not on file  Transportation Needs: Not on file  Physical Activity: Not on file  Stress: Not on file  Social Connections: Not on file  Intimate Partner Violence: Not on file    FAMILY HISTORY: Family History  Problem Relation Age of Onset   Stroke Mother    Throat cancer Father 11   Prostate cancer Brother 73   Breast cancer Maternal Aunt 65   Prostate cancer Maternal Uncle    Prostate cancer Maternal Uncle    Prostate cancer Maternal Uncle    Pancreatic cancer Paternal Uncle    Breast cancer Cousin        4 maternal first cousins   Prostate cancer Cousin    Breast cancer Other        maternal first cousin once removed   Other Other        BRCA+    Review of Systems  Constitutional:  Negative for appetite change, chills, fatigue, fever and unexpected weight change.  HENT:   Negative for hearing loss, lump/mass and trouble swallowing.   Eyes:  Negative for eye problems and icterus.  Respiratory:  Negative for chest tightness, cough and shortness of breath.   Cardiovascular:  Negative for chest pain, leg swelling and palpitations.  Gastrointestinal:  Negative for abdominal distention, abdominal pain, constipation, diarrhea, nausea and vomiting.  Endocrine: Negative for hot flashes.  Genitourinary:  Negative for difficulty urinating.   Musculoskeletal:  Negative for arthralgias.  Skin:  Negative for itching and rash.  Neurological:  Negative for dizziness, extremity weakness, headaches and numbness.  Hematological:  Negative for adenopathy. Does not bruise/bleed easily.  Psychiatric/Behavioral:  Negative  for depression. The patient is not nervous/anxious.       PHYSICAL EXAMINATION   Onc Performance Status - 12/09/23 0956       KPS SCALE   KPS % SCORE Able to carry on normal activity, minor s/s of disease             Vitals:   12/09/23 0949  BP: (!) 145/69  Pulse: 85  Resp: 16  Temp: 97.9 F (36.6 C)  SpO2: 98%    Physical Exam Constitutional:      Appearance: Normal appearance.  HENT:     Head: Normocephalic and atraumatic.  Neurological:     General: No focal deficit  present.     Mental Status: She is alert.  Psychiatric:        Mood and Affect: Mood normal.        Behavior: Behavior normal.    Futher exam deferred in lieu of counseling   ASSESSMENT and THERAPY PLAN:   Malignant neoplasm of upper-outer quadrant of right breast in female, estrogen receptor positive (HCC) 07/24/2022: Screening mammogram detected right breast asymmetry by ultrasound measured 1.2 cm at 7 o'clock position.  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  Left breast biopsy came back benign.    10/20/2022:Left mastectomy: Benign, 2 benign lymph nodes Right mastectomy: Grade 3 ILC 2.1 cm, LCIS, margins negative, 0/4 lymph nodes, ER 95%, PR 80%, HER2 negative 1+, Ki-67 40%   Treatment plan: Oncotype DX: 17 (risk of recurrence 5%) Ajuvant antiestrogen therapy with anastrozole 1 mg daily started 11/11/2022 switched to letrozole July 2024; switched to Exemestane 11/2023  Osteopenia: T-score -2.31 July 2023: Encouraged her to consider bisphosphonate therapy but she wants to do it by exercise vitamin D and calcium.  Breast cancer surveillance: Breast exam 08/24/2023: Benign No role of imaging since she had bilateral mastectomies  Adverse effects of Letrozole Patient reports significant discomfort, including muscle cramps, falls, and a general feeling of accelerated aging. Stopped medication a month ago and reports improvement in  symptoms. -Plan to trial a different aromatase inhibitor, Exemestane, to assess for tolerability. -Start after the holiday season per patient's preference. -If similar side effects occur, patient to discontinue immediately.  Post-mastectomy discomfort Patient reports ongoing soreness and discomfort in the chest and underarm area following multiple surgeries, including fluid drainage. -Encourage patient to follow up with plastic surgeon to address these issues. -Consider non-binding clothing options for comfort.  Follow-up Patient to report back on the tolerability of Exemestane and the outcome of the consultation with the plastic surgeon.   All questions were answered. The patient knows to call the clinic with any problems, questions or concerns. We can certainly see the patient much sooner if necessary.  Total encounter time:30 minutes*in face-to-face visit time, chart review, lab review, care coordination, order entry, and documentation of the encounter time.  Lillard Anes, NP 12/09/23 10:33 AM Medical Oncology and Hematology [facility] [address] [place], [place] [address] Tel. [phone]    Fax. [phone]  *Total Encounter Time as defined by the Centers for Medicare and Medicaid Services includes, in addition to the face-to-face time of a patient visit (documented in the note above) non-face-to-face time: obtaining and reviewing outside history, ordering and reviewing medications, tests or procedures, care coordination (communications with other health care professionals or caregivers) and documentation in the medical record.

## 2024-01-03 ENCOUNTER — Other Ambulatory Visit: Payer: Self-pay | Admitting: Adult Health

## 2024-01-10 ENCOUNTER — Ambulatory Visit: Payer: 59 | Attending: Hematology and Oncology

## 2024-01-10 VITALS — Wt 139.0 lb

## 2024-01-10 DIAGNOSIS — Z483 Aftercare following surgery for neoplasm: Secondary | ICD-10-CM | POA: Insufficient documentation

## 2024-01-10 NOTE — Therapy (Signed)
 OUTPATIENT PHYSICAL THERAPY SOZO SCREENING NOTE   Patient Name: Elizabeth Benton MRN: [id number] DOB:[birth year], 78 y.o., female Today's Date: 01/10/2024  PCP: Theo Iha, MD REFERRING PROVIDER: Odean Potts, MD   PT End of Session - 01/10/24 0949     Visit Number 28   # unchanged due to screen only   PT Start Time 0947    PT Stop Time 0951    PT Time Calculation (min) 4 min    Activity Tolerance Patient tolerated treatment well    Behavior During Therapy [facility] for tasks assessed/performed             Past Medical History:  Diagnosis Date   Arthritis    Breast cancer (HCC)    Depression    H/O migraine    HSV-2 (herpes simplex virus 2) infection    Hyperlipidemia    Hypertension    Lichen sclerosus    Migraine    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   Vaginal atrophy 12/29/2007   Vaginitis and vulvovaginitis 12/28/2004   Past Surgical History:  Procedure Laterality Date   ABDOMINAL HYSTERECTOMY  1987   BREAST RECONSTRUCTION WITH PLACEMENT OF TISSUE EXPANDER AND FLEX HD (ACELLULAR HYDRATED DERMIS) Bilateral 10/20/2022   Procedure: BREAST RECONSTRUCTION WITH PLACEMENT OF TISSUE EXPANDER AND FLEX HD (ACELLULAR HYDRATED DERMIS);  Surgeon: Marene Sieving, MD;  Location: [facility] OR;  Service: Plastics;  Laterality: Bilateral;   DILATION AND CURETTAGE OF UTERUS     IRRIGATION AND DEBRIDEMENT OF WOUND WITH SPLIT THICKNESS SKIN GRAFT Left 04/16/2023   Procedure: debridement of seroma cavity and placement of a drain, left breast;  Surgeon: Waddell Leonce NOVAK, MD;  Location: [facility];  Service: Plastics;  Laterality: Left;   MASTECTOMY W/ SENTINEL NODE BIOPSY Right 10/20/2022   Procedure: RIGHT MASTECTOMY WITH RIGHT SENTINEL LYMPH NODE BIOPSY;  Surgeon: Aron Shoulders, MD;  Location: [facility] OR;  Service: General;  Laterality: Right;   REMOVAL OF BILATERAL TISSUE EXPANDERS WITH PLACEMENT OF BILATERAL BREAST IMPLANTS Bilateral 01/08/2023   Procedure: Removal of bilateral  tissue expanders;  Surgeon: Waddell Leonce NOVAK, MD;  Location: [facility];  Service: Plastics;  Laterality: Bilateral;   TONSILLECTOMY     TOTAL MASTECTOMY Left 10/20/2022   Procedure: LEFT TOTAL MASTECTOMY;  Surgeon: Aron Shoulders, MD;  Location: [facility] OR;  Service: General;  Laterality: Left;   TUBAL LIGATION     WISDOM TOOTH EXTRACTION     Patient Active Problem List   Diagnosis Date Noted   S/P breast reconstruction 10/20/2022   Genetic testing 08/24/2022   Family history of breast cancer 08/06/2022   Family history of prostate cancer 08/06/2022   Malignant neoplasm of upper-outer quadrant of right breast in female, estrogen receptor positive (HCC) 07/31/2022   Abnormal glucose level 12/05/2020   Constipation 12/05/2020   Lumbago with sciatica 12/05/2020   Osteopenia 12/05/2020   Overweight 12/05/2020   Screening for malignant neoplasm of colon 12/05/2020   Vitamin D deficiency 12/05/2020   Essential hypertension 11/30/2019   LBBB (left bundle branch block) 11/30/2019   Exertional dyspnea 11/30/2019   Lichen sclerosus 06/01/2012   Mixed hyperlipidemia    HSV-2 (herpes simplex virus 2) infection    Depression    H/O migraine    Monilia infection    Vaginal atrophy    Wears glasses 06/23/2011   Sinus problem/runny nose 06/23/2011   Mass on back 06/23/2011    REFERRING DIAG: right breast cancer   THERAPY DIAG:  Aftercare following surgery for  neoplasm  PERTINENT HISTORY:   Patient was diagnosed on 07/24/2022 with right grade 2 Invasive Lobular Carcinoma. It measures 1.2 cm and is located in the upper-outer quadrant. It is ER 95%, PR 80% and HER 2 negative with a Ki67 of 40%. She is s/p a Bilateral Mastectomy on 10/20/2022  for Right Breast Cancer and with left prophylactic and bilateral tissue expanders .  She has since had the expanders removed  and bilateral subtotal capsulotomies on Jan. 12,2024. She had 90 cc  of fluid aspirated on 01/28/2023,and 50 cc of   fluid aspirated 02/08/2023 from the left breast area  PRECAUTIONS:  Right UE Lymphedema risk,   SUBJECTIVE: Pt returns for her 3 month L-Dex screen.   PAIN:  Are you having pain? No  SOZO SCREENING: Patient was assessed today using the SOZO machine to determine the lymphedema index score. This was compared to her baseline score. It was determined that she is within the recommended range when compared to her baseline and no further action is needed at this time. She will continue SOZO screenings. These are done every 3 months for 2 years post operatively followed by every 6 months for 2 years, and then annually.   L-DEX FLOWSHEETS - 01/10/24 0900       L-DEX LYMPHEDEMA SCREENING   Measurement Type Unilateral    L-DEX MEASUREMENT EXTREMITY Upper Extremity    POSITION  Standing    DOMINANT SIDE Right    At Risk Side Right    BASELINE SCORE (UNILATERAL) 3.8    L-DEX SCORE (UNILATERAL) 5.3    VALUE CHANGE (UNILAT) 1.5               Elizabeth Benton, PTA 01/10/2024, 9:50 AM

## 2024-01-28 ENCOUNTER — Other Ambulatory Visit: Payer: Self-pay | Admitting: Adult Health

## 2024-01-31 ENCOUNTER — Other Ambulatory Visit: Payer: Self-pay | Admitting: *Deleted

## 2024-01-31 MED ORDER — EXEMESTANE 25 MG PO TABS: 25.0000 mg | ORAL_TABLET | Freq: Every day | ORAL | 2 refills | Status: DC

## 2024-04-10 ENCOUNTER — Ambulatory Visit: Payer: 59

## 2024-06-05 ENCOUNTER — Ambulatory Visit

## 2024-07-10 ENCOUNTER — Ambulatory Visit: Attending: Hematology and Oncology

## 2024-07-10 VITALS — Wt 135.4 lb

## 2024-07-10 DIAGNOSIS — Z483 Aftercare following surgery for neoplasm: Secondary | ICD-10-CM | POA: Insufficient documentation

## 2024-07-10 NOTE — Therapy (Signed)
 OUTPATIENT PHYSICAL THERAPY SOZO SCREENING NOTE   Patient Name: Elizabeth Benton MRN: [id number] DOB:[birth year], 78 y.o., female Today's Date: 07/10/2024  PCP: Theo Iha, MD REFERRING PROVIDER: Odean Potts, MD   PT End of Session - 07/10/24 9176     Visit Number 28   # unchanged due to screen only   PT Start Time 0821    PT Stop Time 0825    PT Time Calculation (min) 4 min    Activity Tolerance Patient tolerated treatment well    Behavior During Therapy [facility] for tasks assessed/performed          Past Medical History:  Diagnosis Date   Arthritis    Breast cancer (HCC)    Depression    H/O migraine    HSV-2 (herpes simplex virus 2) infection    Hyperlipidemia    Hypertension    Lichen sclerosus    Migraine    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   Vaginal atrophy 12/29/2007   Vaginitis and vulvovaginitis 12/28/2004   Past Surgical History:  Procedure Laterality Date   ABDOMINAL HYSTERECTOMY  1987   BREAST RECONSTRUCTION WITH PLACEMENT OF TISSUE EXPANDER AND FLEX HD (ACELLULAR HYDRATED DERMIS) Bilateral 10/20/2022   Procedure: BREAST RECONSTRUCTION WITH PLACEMENT OF TISSUE EXPANDER AND FLEX HD (ACELLULAR HYDRATED DERMIS);  Surgeon: Marene Sieving, MD;  Location: [facility] OR;  Service: Plastics;  Laterality: Bilateral;   DILATION AND CURETTAGE OF UTERUS     IRRIGATION AND DEBRIDEMENT OF WOUND WITH SPLIT THICKNESS SKIN GRAFT Left 04/16/2023   Procedure: debridement of seroma cavity and placement of a drain, left breast;  Surgeon: Waddell Leonce NOVAK, MD;  Location: [facility];  Service: Plastics;  Laterality: Left;   MASTECTOMY W/ SENTINEL NODE BIOPSY Right 10/20/2022   Procedure: RIGHT MASTECTOMY WITH RIGHT SENTINEL LYMPH NODE BIOPSY;  Surgeon: Aron Shoulders, MD;  Location: [facility] OR;  Service: General;  Laterality: Right;   REMOVAL OF BILATERAL TISSUE EXPANDERS WITH PLACEMENT OF BILATERAL BREAST IMPLANTS Bilateral 01/08/2023   Procedure: Removal of bilateral tissue  expanders;  Surgeon: Waddell Leonce NOVAK, MD;  Location: [facility];  Service: Plastics;  Laterality: Bilateral;   TONSILLECTOMY     TOTAL MASTECTOMY Left 10/20/2022   Procedure: LEFT TOTAL MASTECTOMY;  Surgeon: Aron Shoulders, MD;  Location: [facility] OR;  Service: General;  Laterality: Left;   TUBAL LIGATION     WISDOM TOOTH EXTRACTION     Patient Active Problem List   Diagnosis Date Noted   S/P breast reconstruction 10/20/2022   Genetic testing 08/24/2022   Family history of breast cancer 08/06/2022   Family history of prostate cancer 08/06/2022   Malignant neoplasm of upper-outer quadrant of right breast in female, estrogen receptor positive (HCC) 07/31/2022   Abnormal glucose level 12/05/2020   Constipation 12/05/2020   Lumbago with sciatica 12/05/2020   Osteopenia 12/05/2020   Overweight 12/05/2020   Screening for malignant neoplasm of colon 12/05/2020   Vitamin D deficiency 12/05/2020   Essential hypertension 11/30/2019   LBBB (left bundle branch block) 11/30/2019   Exertional dyspnea 11/30/2019   Lichen sclerosus 06/01/2012   Mixed hyperlipidemia    HSV-2 (herpes simplex virus 2) infection    Depression    H/O migraine    Monilia infection    Vaginal atrophy    Wears glasses 06/23/2011   Sinus problem/runny nose 06/23/2011   Mass on back 06/23/2011    REFERRING DIAG: right breast cancer   THERAPY DIAG:  Aftercare following surgery for neoplasm  PERTINENT  HISTORY:   Patient was diagnosed on 07/24/2022 with right grade 2 Invasive Lobular Carcinoma. It measures 1.2 cm and is located in the upper-outer quadrant. It is ER 95%, PR 80% and HER 2 negative with a Ki67 of 40%. She is s/p a Bilateral Mastectomy on 10/20/2022  for Right Breast Cancer and with left prophylactic and bilateral tissue expanders .  She has since had the expanders removed  and bilateral subtotal capsulotomies on Jan. 12,2024. She had 90 cc  of fluid aspirated on 01/28/2023,and 50 cc of  fluid  aspirated 02/08/2023 from the left breast area  PRECAUTIONS:  Right UE Lymphedema risk,   SUBJECTIVE: Pt returns for her 3 month L-Dex screen.   PAIN:  Are you having pain? No  SOZO SCREENING: Patient was assessed today using the SOZO machine to determine the lymphedema index score. This was compared to her baseline score. It was determined that she is within the recommended range when compared to her baseline and no further action is needed at this time. She will continue SOZO screenings. These are done every 3 months for 2 years post operatively followed by every 6 months for 2 years, and then annually.   L-DEX FLOWSHEETS - 07/10/24 0800       L-DEX LYMPHEDEMA SCREENING   Measurement Type Unilateral    L-DEX MEASUREMENT EXTREMITY Upper Extremity    POSITION  Standing    DOMINANT SIDE Right    At Risk Side Right    BASELINE SCORE (UNILATERAL) 3.8    L-DEX SCORE (UNILATERAL) 4.2    VALUE CHANGE (UNILAT) 0.4            Aden Berwyn Caldron, PTA 07/10/2024, 8:24 AM

## 2024-07-26 ENCOUNTER — Ambulatory Visit: Payer: Medicare Other | Admitting: Plastic Surgery

## 2024-07-27 ENCOUNTER — Ambulatory Visit (INDEPENDENT_AMBULATORY_CARE_PROVIDER_SITE_OTHER): Admitting: Plastic Surgery

## 2024-07-27 ENCOUNTER — Encounter: Payer: Self-pay | Admitting: Plastic Surgery

## 2024-07-27 VITALS — BP 161/88 | HR 76 | Ht <= 58 in | Wt 136.6 lb

## 2024-07-27 DIAGNOSIS — Z9889 Other specified postprocedural states: Secondary | ICD-10-CM

## 2024-07-27 DIAGNOSIS — L987 Excessive and redundant skin and subcutaneous tissue: Secondary | ICD-10-CM

## 2024-07-27 NOTE — Progress Notes (Signed)
 Elizabeth Benton returns today approximately a year out from removal of her tissue expanders and return to the OR for management of the seroma.  She is overall doing very well.  She is staying active and has no specific complaints other than some excess skin in her bilateral axillas which occasionally bothers her.  She states that she is usually able to wear a compressive garment to hold the skin out of her way but if she is not wearing this then she tends to have more discomfort especially on the left side.  On examination there is no evidence of seroma and all incisions are well-healed.  She does have excess skin from her mastectomy in both axillas.   I believe it would be relatively straightforward to remove the excess skin to flatten the scars and give her some relief.  She does understand that there is of course always a risk with any type of surgery especially risks related to wound healing.  She has a trip planned in September and will return to see me after her trip and let me know if she would like to proceed with excision of the skin from the axillas.

## 2024-08-22 NOTE — Assessment & Plan Note (Signed)
 07/24/2022: Screening mammogram detected right breast asymmetry by ultrasound measured 1.2 cm at 7 o'clock position.  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  Left breast biopsy came back benign.    10/20/2022:Left mastectomy: Benign, 2 benign lymph nodes Right mastectomy: Grade 3 ILC 2.1 cm, LCIS, margins negative, 0/4 lymph nodes, ER 95%, PR 80%, HER2 negative 1+, Ki-67 40%   Treatment plan: Oncotype DX: 17 (risk of recurrence 5%) Ajuvant antiestrogen therapy with anastrozole  1 mg daily started 11/11/2022 switched to letrozole  July 2024   Letrozole  toxicities: Hot flashes and night sweats: Instructed to take it at bedtime Joint stiffness when she wakes up Osteopenia: T-score -2.31 July 2023: Encouraged her to consider bisphosphonate therapy but she wants to do it by exercise vitamin D and calcium .   Breast cancer surveillance: Breast exam 08/23/2024: Benign No role of imaging since she had bilateral mastectomies   Return to clinic in 1 year for follow-up

## 2024-08-23 ENCOUNTER — Inpatient Hospital Stay: Payer: Medicare Other | Attending: Hematology and Oncology | Admitting: Hematology and Oncology

## 2024-08-23 VITALS — BP 150/76 | HR 86 | Temp 97.6°F | Resp 16 | Wt 137.0 lb

## 2024-08-23 DIAGNOSIS — C50411 Malignant neoplasm of upper-outer quadrant of right female breast: Secondary | ICD-10-CM | POA: Diagnosis not present

## 2024-08-23 DIAGNOSIS — Z9013 Acquired absence of bilateral breasts and nipples: Secondary | ICD-10-CM | POA: Diagnosis not present

## 2024-08-23 DIAGNOSIS — Z853 Personal history of malignant neoplasm of breast: Secondary | ICD-10-CM | POA: Insufficient documentation

## 2024-08-23 DIAGNOSIS — Z170 Estrogen receptor positive status [ER+]: Secondary | ICD-10-CM

## 2024-08-23 DIAGNOSIS — M8580 Other specified disorders of bone density and structure, unspecified site: Secondary | ICD-10-CM | POA: Diagnosis not present

## 2024-08-23 NOTE — Progress Notes (Signed)
 Patient Care Team: Theo Iha, MD as PCP - General (Internal Medicine) Aron Shoulders, MD as Consulting Physician (General Surgery) Odean Potts, MD as Consulting Physician (Hematology and Oncology) Shannon Agent, MD as Consulting Physician (Radiation Oncology)  DIAGNOSIS:  Encounter Diagnosis  Name Primary?   Malignant neoplasm of upper-outer quadrant of right breast in female, estrogen receptor positive (HCC) Yes    SUMMARY OF ONCOLOGIC HISTORY: Oncology History  Malignant neoplasm of upper-outer quadrant of right breast in female, estrogen receptor positive (HCC)  07/24/2022 Initial Diagnosis   Screening mammogram detected right breast asymmetry by ultrasound measured 1.2 cm at 7 o'clock position.  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  Left breast biopsy came back benign.   08/05/2022 Cancer Staging   Staging form: Breast, AJCC 8th Edition - Clinical stage from 08/05/2022: Stage IA (cT1c, cN0, cM0, G2, ER+, PR+, HER2-) - Signed by Odean Potts, MD on 08/05/2022 Stage prefix: Initial diagnosis Histologic grading system: 3 grade system    Genetic Testing   [facility] CancerNext-Expanded Panel was Negative. Report date is 08/20/2022.  The CancerNext-Expanded gene panel offered by [facility] and includes sequencing, rearrangement, and RNA analysis for the following 77 genes: AIP, ALK, APC, ATM, AXIN2, BAP1, BARD1, BLM, BMPR1A, BRCA1, BRCA2, BRIP1, CDC73, CDH1, CDK4, CDKN1B, CDKN2A, CHEK2, CTNNA1, DICER1, FANCC, FH, FLCN, GALNT12, KIF1B, LZTR1, MAX, MEN1, MET, MLH1, MSH2, MSH3, MSH6, MUTYH, NBN, NF1, NF2, NTHL1, PALB2, PHOX2B, PMS2, POT1, PRKAR1A, PTCH1, PTEN, RAD51C, RAD51D, RB1, RECQL, RET, SDHA, SDHAF2, SDHB, SDHC, SDHD, SMAD4, SMARCA4, SMARCB1, SMARCE1, STK11, SUFU, TMEM127, TP53, TSC1, TSC2, VHL and XRCC2 (sequencing and deletion/duplication); EGFR, EGLN1, HOXB13, KIT, MITF, PDGFRA, POLD1, and POLE (sequencing only); EPCAM and  GREM1 (deletion/duplication only).    10/20/2022 Surgery   Left mastectomy: Benign, 2 benign lymph nodes Right mastectomy: Grade 3 ILC 2.1 cm, LCIS, margins negative, 0/4 lymph nodes, ER 95%, PR 80%, HER2 negative 1+, Ki-67 40%   10/2022 - 10/2023 Anti-estrogen oral therapy   Patient could not tolerate any antiestrogen therapy     CHIEF COMPLIANT: Surveillance of breast cancer  HISTORY OF PRESENT ILLNESS:   History of Present Illness Delmy Holdren is a 78 year old female who presents for follow-up regarding medication side effects and bone health.  She discontinued a exemestane  six months ago due to significant side effects, including rapid aging, body aches, and frequent falls. While on the medication, she felt hunched over and unwell. Since stopping the medication, she feels she has returned to her normal state.  She experiences softer bones and a history of falls, which influenced her decision to stop the medication. Pain radiates into her back, with increased severity on the side opposite her previous cancer location. She uses compression garments to manage the discomfort.  She has stopped all medications, including those for blood pressure and cholesterol, since early this year, aiming to stabilize her body. Her general practitioner is monitoring her cholesterol levels. She engages in stretching and walking to maintain her health.     ALLERGIES:  is allergic to contrast media [iodinated contrast media], demerol, iodine, shellfish allergy, and anastrozole .  MEDICATIONS:  Current Outpatient Medications  Medication Sig Dispense Refill   acetaminophen  (TYLENOL ) 650 MG CR tablet Take 650 mg by mouth every 8 (eight) hours as needed for pain.     betamethasone dipropionate 0.05 % cream Apply 1 Application topically 2 (two) times daily as needed for rash.  Carboxymethylcellul-Glycerin  (LUBRICATING EYE DROPS OP) Place 1 drop into both eyes daily as needed (dry eyes).      methocarbamol  (ROBAXIN ) 500 MG tablet Take 1 tablet (500 mg total) by mouth every 8 (eight) hours as needed for up to 15 doses for muscle spasms. 15 tablet 0   ondansetron  (ZOFRAN ) 4 MG tablet Take 1 tablet (4 mg total) by mouth every 8 (eight) hours as needed for up to 20 doses for nausea or vomiting. 20 tablet 0   TURMERIC PO Take by mouth.     senna (SENOKOT) 8.6 MG TABS tablet Take 1 tablet (8.6 mg total) by mouth 2 (two) times daily. (Patient not taking: Reported on 08/23/2024) 120 tablet 0   No current facility-administered medications for this visit.    PHYSICAL EXAMINATION: ECOG PERFORMANCE STATUS: 1 - Symptomatic but completely ambulatory  Vitals:   08/23/24 1111  BP: (!) 150/76  Pulse: 86  Resp: 16  Temp: 97.6 F (36.4 C)  SpO2: 100%   Filed Weights   08/23/24 1111  Weight: 137 lb (62.1 kg)    Physical Exam MUSCULOSKELETAL: Tenderness in the spine with palpation.  (exam performed in the presence of a chaperone)  LABORATORY DATA:  I have reviewed the data as listed    Latest Ref Rng & Units 10/21/2022    5:19 AM 10/13/2022    3:50 PM 08/05/2022   12:28 PM  CMP  Glucose 70 - 99 mg/dL 852  887  865   BUN 8 - 23 mg/dL 6  11  11    Creatinine 0.44 - 1.00 mg/dL 9.46  9.23  9.24   Sodium 135 - 145 mmol/L 134  139  139   Potassium 3.5 - 5.1 mmol/L 4.6  3.7  3.9   Chloride 98 - 111 mmol/L 105  107  107   CO2 22 - 32 mmol/L 22  24  26    Calcium  8.9 - 10.3 mg/dL 8.7  9.3  9.5   Total Protein 6.5 - 8.1 g/dL   7.5   Total Bilirubin 0.3 - 1.2 mg/dL   0.4   Alkaline Phos 38 - 126 U/L   62   AST 15 - 41 U/L   20   ALT 0 - 44 U/L   19     Lab Results  Component Value Date   WBC 9.7 10/22/2022   HGB 10.2 (L) 10/22/2022   HCT 32.6 (L) 10/22/2022   MCV 87.2 10/22/2022   PLT 186 10/22/2022   NEUTROABS 5.6 08/05/2022    ASSESSMENT & PLAN:  Malignant neoplasm of upper-outer quadrant of right breast in female, estrogen receptor positive (HCC) 07/24/2022: Screening  mammogram detected right breast asymmetry by ultrasound measured 1.2 cm at 7 o'clock position.  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  Left breast biopsy came back benign.    10/20/2022:Left mastectomy: Benign, 2 benign lymph nodes Right mastectomy: Grade 3 ILC 2.1 cm, LCIS, margins negative, 0/4 lymph nodes, ER 95%, PR 80%, HER2 negative 1+, Ki-67 40%   Treatment plan: Oncotype DX: 17 (risk of recurrence 5%) Ajuvant antiestrogen therapy with anastrozole  1 mg daily started 11/11/2022 switched to letrozole  July 2024   Letrozole  toxicities: Hot flashes and night sweats: Instructed to take it at bedtime Joint stiffness when she wakes up Osteopenia: T-score -2.31 July 2023: Encouraged her to consider bisphosphonate therapy but she wants to do it by exercise vitamin D and calcium .   Breast cancer  surveillance: Breast exam 08/23/2024: Benign: Tenderness in the left axillary soft tissue fullness.  She is going to discuss with surgery about removing it. No role of imaging since she had bilateral mastectomies   Is going to [place] in a few weeks and is super excited about that. Return to clinic in 1 year for follow-up ------------------------------------- Assessment and Plan Assessment & Plan History of malignant neoplasm of upper-outer quadrant of right breast, status post treatment No current cancer concerns. Pain likely due to nerve regeneration. No imaging needed. - Consider surgical consultation for nerve-related pain after vacation.  Letrozole  intolerance with musculoskeletal side effects Intolerance to Letrozole  due to musculoskeletal side effects. Symptoms improved post-discontinuation. She decided to stop all medications.  Decreased bone density Reduced bone density increases risk of falls and fractures. She prefers non-pharmacological management. - Advise to avoid falls to prevent fractures.      No orders of the defined types  were placed in this encounter.  The patient has a good understanding of the overall plan. she agrees with it. she will call with any problems that may develop before the next visit here. Total time spent: 30 mins including face to face time and time spent for planning, charting and co-ordination of care   Naomi MARLA Chad, MD 08/23/24

## 2024-10-18 ENCOUNTER — Other Ambulatory Visit: Payer: Self-pay | Admitting: Internal Medicine

## 2024-10-18 ENCOUNTER — Ambulatory Visit
Admission: RE | Admit: 2024-10-18 | Discharge: 2024-10-18 | Disposition: A | Discharge status: Home / Self Care | Source: Ambulatory Visit | Attending: Internal Medicine | Admitting: Internal Medicine

## 2024-10-18 DIAGNOSIS — M25561 Pain in right knee: Secondary | ICD-10-CM

## 2024-12-11 ENCOUNTER — Ambulatory Visit: Admitting: Cardiology

## 2024-12-11 ENCOUNTER — Other Ambulatory Visit (HOSPITAL_COMMUNITY): Payer: Self-pay

## 2024-12-11 ENCOUNTER — Encounter: Payer: Self-pay | Admitting: Cardiology

## 2024-12-11 VITALS — BP 134/68 | HR 80 | Ht 60.0 in | Wt 148.0 lb

## 2024-12-11 DIAGNOSIS — R0609 Other forms of dyspnea: Secondary | ICD-10-CM

## 2024-12-11 DIAGNOSIS — I10 Essential (primary) hypertension: Secondary | ICD-10-CM

## 2024-12-11 DIAGNOSIS — E782 Mixed hyperlipidemia: Secondary | ICD-10-CM

## 2024-12-11 LAB — LIPID PANEL
Chol/HDL Ratio: 3.9 ratio (ref 0.0–4.4)
Cholesterol, Total: 239 mg/dL — ABNORMAL HIGH (ref 100–199)
HDL: 62 mg/dL (ref >39)
LDL Chol Calc (NIH): 161 mg/dL — ABNORMAL HIGH (ref 0–99)
Triglycerides: 91 mg/dL (ref 0–149)
VLDL Cholesterol Cal: 16 mg/dL (ref 5–40)

## 2024-12-11 MED ORDER — AMLODIPINE BESYLATE 5 MG PO TABS
5.0000 mg | ORAL_TABLET | Freq: Every day | ORAL | 3 refills | Status: DC
  Filled 2024-12-11: qty 30, 30d supply, fill #0
  Filled 2025-01-17: qty 30, 30d supply, fill #1

## 2024-12-11 MED ORDER — ROSUVASTATIN CALCIUM 20 MG PO TABS
20.0000 mg | ORAL_TABLET | Freq: Every day | ORAL | 3 refills | Status: AC
  Filled 2024-12-11: qty 30, 30d supply, fill #0
  Filled 2025-01-17: qty 30, 30d supply, fill #1

## 2024-12-11 NOTE — Progress Notes (Signed)
 Cardiology Office Note:  .   Date:  12/11/2024  ID:  Elizabeth Benton, DOB 05-31-1946, MRN [id number] PCP: Theo Iha, MD  [facility] Providers Cardiologist:  Newman Lawrence, MD PCP: Theo Iha, MD  Chief Complaint  Patient presents with   Hyperlipidemia          Elizabeth Benton Benton a 78 y.o. female with hypertension, hyperlipidemia, h/o breast cancer.  Discussed the use of AI scribe software for clinical note transcription with the patient, who gave verbal consent to proceed.  History of Present Illness Elizabeth Benton Benton a 78 year old female who presents for follow-up regarding her cardiac health and medication management.  She had breast cancer treated with bilateral mastectomy in 2023 and two additional surgeries in 2024 for fluid accumulation. She was treated with endocrine therapy, most recently letrozole , which she stopped around April-May 2025 due to dizziness and falls. She did not receive chemotherapy or radiation.  [name] currently off blood pressure and cholesterol medications. Her LDL decreased from 134 to 74 on Crestor  but has risen to 156 since stopping. Home blood pressure readings are generally 134-143/80-85. She notes increasing shortness of breath with short walks and no chest pain. She checks her blood pressure about twice a week, with recorded values ranging from systolic 80 to 143.      Vitals:   12/11/24 0834  BP: 134/68  Pulse: 80  SpO2: 94%      Review of Systems  Cardiovascular:  Positive for dyspnea on exertion. Negative for chest pain, leg swelling, palpitations and syncope.        Studies Reviewed: SABRA        EKG 12/11/2024: Normal sinus rhythm Left axis deviation Left bundle branch block When compared with ECG of 05-Jan-2023 11:33, No significant change was found    Labs 09/2024: Chol 246, TG 171, HDL 59, LDL 156 HbA1C 6.0% Hb 14.1 Cr 0.86    Physical Exam Vitals and nursing note reviewed.   Constitutional:      General: [name] not in acute distress. Neck:     Vascular: No JVD.  Cardiovascular:     Rate and Rhythm: Normal rate and regular rhythm.     Heart sounds: Normal heart sounds. No murmur heard. Pulmonary:     Effort: Pulmonary effort Benton normal.     Breath sounds: Normal breath sounds. No wheezing or rales.  Musculoskeletal:     Right lower leg: No edema.     Left lower leg: No edema.      VISIT DIAGNOSES:   ICD-10-CM   1. Essential hypertension  I10 EKG 12-Lead    2. Mixed hyperlipidemia  E78.2 Lipid panel    CT CARDIAC SCORING (SELF PAY ONLY)    Lipid panel    3. DOE (dyspnea on exertion)  R06.09 Basic metabolic panel with GFR    Pro b natriuretic peptide (BNP)    CBC    ECHOCARDIOGRAM COMPLETE       Elizabeth Benton a 78 y.o. female with hypertension, hyperlipidemia, h/o breast cancer.  Assessment & Plan Dyspnea on exertion: Clinically euvolemic.  While deconditioning more likely, recommend CBC, BMP, proBNP, echocardiogram to evaluate for any cardiac cause.  Primary hypertension: Blood pressure fluctuates between 130s to 150s systolic. Currently not on antihypertensive medication. Open to restarting medication. - Started amlodipine  5 mg daily. - Advised on low salt diet.  Mixed hyperlipidemia: Previously on statin, but currently off it.  LDL Benton  now elevated at 156. Check calcium  score scan for risk stratification. Started Crestor  20 mg daily. Ordered repeat lipid panel in 3 months.  History of breast cancer, post bilateral mastectomy: Bilateral mastectomy in 2023 with additional surgeries in 2024 for fluid accumulation. Currently under surveillance with plastic surgery and oncology. No chemotherapy or radiation due to adverse effects from cancer blockers. - Continue surveillance with plastic surgery and oncology.    Meds ordered this encounter  Medications   rosuvastatin  (CRESTOR ) 20 MG tablet    Sig: Take 1 tablet (20 mg total) by  mouth daily.    Dispense:  90 tablet    Refill:  3   amLODipine  (NORVASC ) 5 MG tablet    Sig: Take 1 tablet (5 mg total) by mouth daily.    Dispense:  180 tablet    Refill:  3     F/u in 3 months  Signed, Newman JINNY Lawrence, MD

## 2024-12-11 NOTE — Patient Instructions (Signed)
 Medication Instructions:  START Amlodipine  5 mg daily  START Crestor  20 mg daily   *If you need a refill on your cardiac medications before your next appointment, please call your pharmacy*  Lab Work: LIPID PANEL IN 3 MONTHS   TODAY: CBC BMP PROBNP  If you have labs (blood work) drawn today and your tests are completely normal, you will receive your results only by: MyChart Message (if you have MyChart) OR A paper copy in the mail If you have any lab test that is abnormal or we need to change your treatment, we will call you to review the results.  Testing/Procedures: ECHOCARDIOGRAM  Your physician has requested that you have an echocardiogram. Echocardiography is a painless test that uses sound waves to create images of your heart. It provides your doctor with information about the size and shape of your heart and how well your hearts chambers and valves are working. This procedure takes approximately one hour. There are no restrictions for this procedure. Please do NOT wear cologne, perfume, aftershave, or lotions (deodorant is allowed). Please arrive 15 minutes prior to your appointment time.  Please note: We ask at that you not bring children with you during ultrasound (echo/ vascular) testing. Due to room size and safety concerns, children are not allowed in the ultrasound rooms during exams. Our front office staff cannot provide observation of children in our lobby area while testing is being conducted. An adult accompanying a patient to their appointment will only be allowed in the ultrasound room at the discretion of the ultrasound technician under special circumstances. We apologize for any inconvenience.  CALCIUM  SCORE   CT scanning for a cardiac calcium  score (CAT scanning), is a noninvasive, special x-ray that produces cross-sectional images of the body using x-rays and a computer. CT scans help physicians diagnose and treat medical conditions. For some CT exams, a contrast  material is used to enhance visibility in the area of the body being studied. CT scans provide greater clarity and reveal more details than regular x-ray exams. Your physician has requested that you have a coronary calcium  score performed. This is not covered by insurance and will be an out-of-pocket cost of approximately $99.   Follow-Up: At [facility], you and your health needs are our priority.  As part of our continuing mission to provide you with exceptional heart care, our providers are all part of one team.  This team includes your primary Cardiologist (physician) and Advanced Practice Providers or APPs (Physician Assistants and Nurse Practitioners) who all work together to provide you with the care you need, when you need it.  Your next appointment:   3 month(s) AFTER LABS AND ECHOCARDIOGRAM  Provider:   Newman JINNY Lawrence, MD

## 2024-12-12 ENCOUNTER — Ambulatory Visit: Payer: Self-pay | Admitting: Cardiology

## 2024-12-12 LAB — BASIC METABOLIC PANEL WITH GFR
BUN/Creatinine Ratio: 17 (ref 12–28)
BUN: 14 mg/dL (ref 8–27)
CO2: 20 mmol/L (ref 20–29)
Calcium: 9.7 mg/dL (ref 8.7–10.3)
Chloride: 105 mmol/L (ref 96–106)
Creatinine, Ser: 0.84 mg/dL (ref 0.57–1.00)
Glucose: 98 mg/dL (ref 70–99)
Potassium: 5.1 mmol/L (ref 3.5–5.2)
Sodium: 140 mmol/L (ref 134–144)
eGFR: 72 mL/min/1.73 (ref >59)

## 2024-12-12 LAB — CBC
Hematocrit: 44.6 % (ref 34.0–46.6)
Hemoglobin: 14.5 g/dL (ref 11.1–15.9)
MCH: 27.5 pg (ref 26.6–33.0)
MCHC: 32.5 g/dL (ref 31.5–35.7)
MCV: 85 fL (ref 79–97)
Platelets: 255 x10E3/uL (ref 150–450)
RBC: 5.27 x10E6/uL (ref 3.77–5.28)
RDW: 13.7 % (ref 11.7–15.4)
WBC: 5.7 x10E3/uL (ref 3.4–10.8)

## 2024-12-12 LAB — PRO B NATRIURETIC PEPTIDE: NT-Pro BNP: 234 pg/mL (ref 0–738)

## 2024-12-12 NOTE — Progress Notes (Signed)
 Restarted on statin yesterday.  Repeat lipid panel in 3 months.  Thanks MJP

## 2025-01-08 ENCOUNTER — Ambulatory Visit: Attending: Hematology and Oncology

## 2025-01-08 VITALS — Wt 139.4 lb

## 2025-01-08 DIAGNOSIS — Z483 Aftercare following surgery for neoplasm: Secondary | ICD-10-CM | POA: Insufficient documentation

## 2025-01-08 NOTE — Therapy (Signed)
 " OUTPATIENT PHYSICAL THERAPY SOZO SCREENING NOTE   Patient Name: Elizabeth Benton MRN: [id number] DOB:Jun 05, 1946, 79 y.o., female Today's Date: 01/08/2025  PCP: Theo Iha, MD REFERRING PROVIDER: Odean Potts, MD   PT End of Session - 01/08/25 1018     Visit Number 28   # unchanged due to screen only   PT Start Time 1017    PT Stop Time 1021    PT Time Calculation (min) 4 min    Activity Tolerance Patient tolerated treatment well    Behavior During Therapy WFL for tasks assessed/performed          Past Medical History:  Diagnosis Date   Arthritis    Breast cancer (HCC)    Depression    H/O migraine    HSV-2 (herpes simplex virus 2) infection    Hyperlipidemia    Hypertension    Lichen sclerosus    Migraine    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   Vaginal atrophy 12/29/2007   Vaginitis and vulvovaginitis 12/28/2004   Past Surgical History:  Procedure Laterality Date   ABDOMINAL HYSTERECTOMY  1987   BREAST RECONSTRUCTION WITH PLACEMENT OF TISSUE EXPANDER AND FLEX HD (ACELLULAR HYDRATED DERMIS) Bilateral 10/20/2022   Procedure: BREAST RECONSTRUCTION WITH PLACEMENT OF TISSUE EXPANDER AND FLEX HD (ACELLULAR HYDRATED DERMIS);  Surgeon: Marene Sieving, MD;  Location: [facility] OR;  Service: Plastics;  Laterality: Bilateral;   DILATION AND CURETTAGE OF UTERUS     IRRIGATION AND DEBRIDEMENT OF WOUND WITH SPLIT THICKNESS SKIN GRAFT Left 04/16/2023   Procedure: debridement of seroma cavity and placement of a drain, left breast;  Surgeon: Waddell Leonce NOVAK, MD;  Location: [facility];  Service: Plastics;  Laterality: Left;   MASTECTOMY W/ SENTINEL NODE BIOPSY Right 10/20/2022   Procedure: RIGHT MASTECTOMY WITH RIGHT SENTINEL LYMPH NODE BIOPSY;  Surgeon: Aron Shoulders, MD;  Location: [facility] OR;  Service: General;  Laterality: Right;   REMOVAL OF BILATERAL TISSUE EXPANDERS WITH PLACEMENT OF BILATERAL BREAST IMPLANTS Bilateral 01/08/2023   Procedure: Removal of bilateral tissue  expanders;  Surgeon: Waddell Leonce NOVAK, MD;  Location: [facility];  Service: Plastics;  Laterality: Bilateral;   TONSILLECTOMY     TOTAL MASTECTOMY Left 10/20/2022   Procedure: LEFT TOTAL MASTECTOMY;  Surgeon: Aron Shoulders, MD;  Location: [facility] OR;  Service: General;  Laterality: Left;   TUBAL LIGATION     WISDOM TOOTH EXTRACTION     Patient Active Problem List   Diagnosis Date Noted   S/P breast reconstruction 10/20/2022   Genetic testing 08/24/2022   Family history of breast cancer 08/06/2022   Family history of prostate cancer 08/06/2022   Malignant neoplasm of upper-outer quadrant of right breast in female, estrogen receptor positive (HCC) 07/31/2022   Abnormal glucose level 12/05/2020   Constipation 12/05/2020   Lumbago with sciatica 12/05/2020   Osteopenia 12/05/2020   Overweight 12/05/2020   Screening for malignant neoplasm of colon 12/05/2020   Vitamin D deficiency 12/05/2020   Essential hypertension 11/30/2019   LBBB (left bundle branch block) 11/30/2019   DOE (dyspnea on exertion) 11/30/2019   Lichen sclerosus 06/01/2012   Mixed hyperlipidemia    HSV-2 (herpes simplex virus 2) infection    Depression    H/O migraine    Monilia infection    Vaginal atrophy    Wears glasses 06/23/2011   Sinus problem/runny nose 06/23/2011   Mass on back 06/23/2011    REFERRING DIAG: right breast cancer   THERAPY DIAG: Aftercare following surgery for neoplasm  PERTINENT HISTORY: Patient was diagnosed on 07/24/2022 with right grade 2 Invasive Lobular Carcinoma. It measures 1.2 cm and is located in the upper-outer quadrant. It is ER 95%, PR 80% and HER 2 negative with a Ki67 of 40%. She is s/p a Bilateral Mastectomy on 10/20/2022  for Right Breast Cancer and with left prophylactic and bilateral tissue expanders .  She has since had the expanders removed  and bilateral subtotal capsulotomies on Jan. 12,2024. She had 90 cc  of fluid aspirated on 01/28/2023,and 50 cc of  fluid  aspirated 02/08/2023 from the left breast area  PRECAUTIONS:  Right UE Lymphedema risk,   SUBJECTIVE: Pt returns for her 3 month L-Dex screen.   PAIN:  Are you having pain? No  SOZO SCREENING: Patient was assessed today using the SOZO machine to determine the lymphedema index score. This was compared to her baseline score. It was determined that she is within the recommended range when compared to her baseline and no further action is needed at this time. She will continue SOZO screenings. These are done every 3 months for 2 years post operatively followed by every 6 months for 2 years, and then annually.   L-DEX FLOWSHEETS - 01/08/25 1000       L-DEX LYMPHEDEMA SCREENING   Measurement Type Unilateral    L-DEX MEASUREMENT EXTREMITY Upper Extremity    POSITION  Standing    DOMINANT SIDE Right    At Risk Side Right    BASELINE SCORE (UNILATERAL) 3.8    L-DEX SCORE (UNILATERAL) 1.9    VALUE CHANGE (UNILAT) -1.9         P: Cont every 6 month L-Dex screens until 09/2026.   Aden Berwyn Caldron, PTA 01/08/2025, 10:20 AM     "

## 2025-01-10 ENCOUNTER — Ambulatory Visit (HOSPITAL_COMMUNITY)
Admission: RE | Admit: 2025-01-10 | Discharge: 2025-01-10 | Disposition: A | Discharge status: Home / Self Care | Source: Ambulatory Visit | Attending: Cardiology | Admitting: Cardiology

## 2025-01-10 ENCOUNTER — Ambulatory Visit (HOSPITAL_BASED_OUTPATIENT_CLINIC_OR_DEPARTMENT_OTHER)
Admission: RE | Admit: 2025-01-10 | Discharge: 2025-01-10 | Disposition: A | Discharge status: Home / Self Care | Payer: Self-pay | Source: Ambulatory Visit | Attending: Cardiology | Admitting: Cardiology

## 2025-01-10 DIAGNOSIS — R0609 Other forms of dyspnea: Secondary | ICD-10-CM | POA: Insufficient documentation

## 2025-01-10 DIAGNOSIS — E782 Mixed hyperlipidemia: Secondary | ICD-10-CM | POA: Insufficient documentation

## 2025-01-10 LAB — ECHOCARDIOGRAM COMPLETE
AR max vel: 1.87 cm2
AV Area VTI: 1.82 cm2
AV Area mean vel: 1.81 cm2
AV Mean grad: 3 mmHg
AV Peak grad: 4.9 mmHg
Ao pk vel: 1.11 m/s
Area-P 1/2: 2.99 cm2
S' Lateral: 3.63 cm

## 2025-01-10 NOTE — Progress Notes (Signed)
 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  Needs cardiac catheterization for further evaluation.  Please arrange office visit with me or APP to facilitate this.  Also, will need GDMT for low EF, I will hold initiation of ARB/ARNI until after the cath, but can start with low-dose beta-blocker and/or spironolactone.  This can be discussed and ordered during upcoming office visit.  In the meantime, recommend starting aspirin 81 mg daily, continue amlodipine  and rosuvastatin .  Thanks MJP

## 2025-01-16 NOTE — Progress Notes (Unsigned)
 " Cardiology Office Note   Date:  01/17/2025  ID:  Elizabeth, Benton 04/16/1946, MRN [id number] PCP: Theo Iha, MD  [facility] Providers Cardiologist:  Newman JINNY Lawrence, MD   History of Present Illness Elizabeth Benton is a 79 y.o. female who presents for follow-up appointment with past medical history of hypertension, hyperlipidemia, and breast cancer.  She was seen in December 2025 and was noting increased shortness of breath with short walks at that time but no chest pain.  History includes bilateral mastectomy in 2023 with 2 additional surgeries in 2024 for fluid accumulation.  Treated with endocrine therapy most recently with letrozole  which she stopped around April/May 2025 due to dizziness and falls.  Did not receive any chemotherapy or radiation.  At her last office visit her LDL decreased from 134 down to 74 on Crestor  but she ended up stopping this medication and it rose to 156.  Blood pressure readings in general are 134-143/80-85.  Noting increased shortness of breath with short walks but no chest pain.  Checks blood pressure about twice a week with recorded values ranging from systolic of 80-1 43.  Lab work was ordered at that appointment.  Amlodipine  5 mg daily was started for hypertension.  Echocardiogram was ordered to evaluate for cardiac cause.  She also had a CT scan which showed a calcium  score of 73 which was 67 percentile for age, race, and sex matched controls.  Her echo showed a moderately reduced LVEF of 35 to 40% with severe hypokinesis of the apical septum and apex.  LBBB was noted with septal lateral dyssynchrony.  Findings could be related to LBBB but hypokinesis seems out of proportion to conduction disease.  She is being seen today to set up a right and left heart catheterization.  Today, she presents with a hx of coronary artery disease with shortness of breath and recent echocardiogram findings.  A recent CT coronary calcium  scan showed a score  of 73, in the 62nd percentile for her age and gender, with vessel calcification but no intervention recommended. An echocardiogram showed a left ventricular ejection fraction of 35-40% and a left bundle branch block.  Since around Christmas she has had exertional shortness of breath. She becomes very tired and winded with walking and needs to stop to rest. She has occasional swelling of the feet and ankles. Her LDL cholesterol in December was 161 mg/dL, and she started Crestor  20 mg daily just before Christmas. She has not had repeat labs since starting the statin.  Reports no shortness of breath nor dyspnea on exertion. Reports no chest pain, pressure, or tightness. No edema, orthopnea, PND. Reports no palpitations.   Discussed the use of AI scribe software for clinical note transcription with the patient, who gave verbal consent to proceed.  ROS: Pertinent ROS in HPI  Studies Reviewed EKG Interpretation Date/Time:  Wednesday January 17 2025 14:46:10 EST Ventricular Rate:  84 PR Interval:  136 QRS Duration:  140 QT Interval:  408 QTC Calculation: 482 R Axis:   -59  Text Interpretation: Normal sinus rhythm Left axis deviation Left bundle branch block When compared with ECG of 11-Dec-2024 08:40, No significant change was found Confirmed by Lucien Blanc [phone]) on 01/17/2025 3:59:00 PM   Echo 01/10/25 IMPRESSIONS     1. Moderately reduced LVEF 35-40%. Severe hypokinesis of the apical  septum and apex. LBBB noted with septal-lateral dys-synchrony. Findings  may all be related to LBBB, but hypokinesis seems out of proportion to  conduction disease. No definite LV  thrombus, but IV noto able to be obtained. Would recommend limited study  with contrast to exclude thrombus. Left ventricular ejection fraction, by  estimation, is 35 to 40%. The left ventricle has moderately decreased  function. The left ventricle  demonstrates regional wall motion abnormalities (see scoring  diagram/findings  for description). Left ventricular diastolic parameters  are consistent with Grade I diastolic dysfunction (impaired relaxation).   2. Right ventricular systolic function is normal. The right ventricular  size is normal. There is normal pulmonary artery systolic pressure. The  estimated right ventricular systolic pressure is 18.7 mmHg.   3. The mitral valve is grossly normal. Trivial mitral valve  regurgitation. No evidence of mitral stenosis.   4. The aortic valve is tricuspid. Aortic valve regurgitation is not  visualized. No aortic stenosis is present.   5. The inferior vena cava is normal in size with greater than 50%  respiratory variability, suggesting right atrial pressure of 3 mmHg.      Physical Exam VS:  BP (!) 142/84   Pulse 87   Ht 4' 9 (1.448 m)   Wt 143 lb (64.9 kg)   SpO2 96%   BMI 30.94 kg/m        Wt Readings from Last 3 Encounters:  01/17/25 143 lb (64.9 kg)  01/08/25 139 lb 6 oz (63.2 kg)  12/11/24 148 lb (67.1 kg)    GEN: Well nourished, well developed in no acute distress NECK: No JVD; No carotid bruits CARDIAC: RRR, no murmurs, rubs, gallops RESPIRATORY:  Clear to auscultation without rales, wheezing or rhonchi  ABDOMEN: Soft, non-tender, non-distended EXTREMITIES:  No edema; No deformity   ASSESSMENT AND PLAN  Heart failure with reduced ejection fraction Severe hypokinesis with LVEF 35-40%. Possible contributing factors: LBBB, CAD. Symptoms: dyspnea on exertion. - Proceed with cardiac catheterization to assess CAD and potential stenting. - Schedule follow-up within 1-2 weeks post-procedure for wrist site and recovery assessment. - Arrange three-month follow-up with cardiologist.  Coronary artery disease Coronary calcium  score 73, 62nd percentile for age/gender. No immediate intervention based on CT. Cardiac catheterization planned to evaluate blockages. - Perform cardiac catheterization to evaluate coronary arteries and consider stenting if  significant blockages found. - Monitor kidney function post-procedure due to contrast dye use.  Mixed hyperlipidemia LDL 161 mg/dL, above target <44 mg/dL for CAD. On Crestor  20 mg since December, no recent lipid panel. - Schedule follow-up lipid panel in approximately three weeks to assess response to Crestor . - Continue Crestor  20 mg daily.  Dyspnea on exertion Shortness of breath with activity, worsening heart failure or CAD indicated. - Evaluate for CAD via cardiac catheterization. - Advised lifestyle modifications: elevate feet, use compression socks for peripheral edema.   The patient understands that risks include but are not limited to stroke (1 in 1000), death (1 in 1000), kidney failure [usually temporary] (1 in 500), bleeding (1 in 200), allergic reaction [possibly serious] (1 in 200), and agrees to proceed.        Dispo: She can follow-up in 3 weeks post cath  Signed, Orren LOISE Fabry, PA-C   "

## 2025-01-17 ENCOUNTER — Other Ambulatory Visit (HOSPITAL_COMMUNITY): Payer: Self-pay

## 2025-01-17 ENCOUNTER — Ambulatory Visit: Attending: Physician Assistant | Admitting: Physician Assistant

## 2025-01-17 ENCOUNTER — Encounter: Payer: Self-pay | Admitting: Physician Assistant

## 2025-01-17 VITALS — BP 142/84 | HR 87 | Ht <= 58 in | Wt 143.0 lb

## 2025-01-17 DIAGNOSIS — Z9013 Acquired absence of bilateral breasts and nipples: Secondary | ICD-10-CM | POA: Diagnosis not present

## 2025-01-17 DIAGNOSIS — E782 Mixed hyperlipidemia: Secondary | ICD-10-CM

## 2025-01-17 DIAGNOSIS — R0609 Other forms of dyspnea: Secondary | ICD-10-CM | POA: Diagnosis not present

## 2025-01-17 DIAGNOSIS — I10 Essential (primary) hypertension: Secondary | ICD-10-CM

## 2025-01-17 MED ORDER — PREDNISONE 50 MG PO TABS
ORAL_TABLET | ORAL | 0 refills | Status: AC
  Filled 2025-01-17: qty 3, 2d supply, fill #0
  Filled 2025-01-17: qty 3, 1d supply, fill #0

## 2025-01-17 MED ORDER — DIPHENHYDRAMINE HCL 50 MG PO TABS: ORAL_TABLET | ORAL | 0 refills | Status: DC

## 2025-01-17 MED ORDER — DIPHENHYDRAMINE HCL 25 MG PO TABS
50.0000 mg | ORAL_TABLET | Freq: Every day | ORAL | 0 refills | Status: AC
  Filled 2025-01-17 (×2): qty 2, 1d supply, fill #0

## 2025-01-17 MED ORDER — PREDNISONE 50 MG PO TABS: ORAL_TABLET | ORAL | 0 refills | Status: DC

## 2025-01-17 NOTE — Patient Instructions (Addendum)
 Medication Instructions:  Your physician recommends that you continue on your current medications as directed. Please refer to the Current Medication list given to you today. *If you need a refill on your cardiac medications before your next appointment, please call your pharmacy*  Lab Work: TODAY-BMET & CBC If you have labs (blood work) drawn today and your tests are completely normal, you will receive your results only by: MyChart Message (if you have MyChart) OR A paper copy in the mail If you have any lab test that is abnormal or we need to change your treatment, we will call you to review the results.  Testing/Procedures: CATH SCHEDULED ON 01/25/2025 Your physician has requested that you have a cardiac catheterization. Cardiac catheterization is used to diagnose and/or treat various heart conditions. Doctors may recommend this procedure for a number of different reasons. The most common reason is to evaluate chest pain. Chest pain can be a symptom of coronary artery disease (CAD), and cardiac catheterization can show whether plaque is narrowing or blocking your hearts arteries. This procedure is also used to evaluate the valves, as well as measure the blood flow and oxygen levels in different parts of your heart. For further information please visit [email or web address] Please follow instruction sheet, as given.  Follow-Up: At [facility], you and your health needs are our priority.  As part of our continuing mission to provide you with exceptional heart care, our providers are all part of one team.  This team includes your primary Cardiologist (physician) and Advanced Practice Providers or APPs (Physician Assistants and Nurse Practitioners) who all work together to provide you with the care you need, when you need it.  Your next appointment:   3 week(s) AFTER CATH   Provider:   Newman JINNY Lawrence, MD or Orren Fabry, PA   We recommend signing up for the patient portal called  MyChart.  Sign up information is provided on this After Visit Summary.  MyChart is used to connect with patients for Virtual Visits (Telemedicine).  Patients are able to view lab/test results, encounter notes, upcoming appointments, etc.  Non-urgent messages can be sent to your provider as well.   To learn more about what you can do with MyChart, go to [email or web address].   Other Instructions         Cardiac Catheterization   You are scheduled for a Cardiac Catheterization on Thursday, January 29 with Dr. Lonni End.  1. Please arrive at the [facility] (Main Entrance A) at [facility]: [address] [place], [place] [address] at 7:00 AM (This time is 2 hour(s) before your procedure to ensure your preparation).   Free valet parking service is available. You will check in at ADMITTING. The support person will be asked to wait in the waiting room.  It is OK to have someone drop you off and come back when you are ready to be discharged.        Special note: Every effort is made to have your procedure done on time. Please understand that emergencies sometimes delay scheduled procedures.  2. Diet: Nothing to eat after midnight.  3. Hydration:On January 29, you may drink approved liquids (see below) until 2 hours before the procedure with 8 oz of water as your last intake.   List of approved liquids water, clear juice, clear tea, black coffee, fruit juices, non-citric and without pulp, carbonated beverages, Gatorade, Kool -Aid, plain Jello-O and plain ice popsicles.  4. Labs: You will need to have  blood drawn on Wednesday, January 21 at [facility] (1st Floor), [address] [place], [place] [address]. You do not need to be fasting.  5. Medication instructions in preparation for your procedure:   Contrast Allergy: Yes, Please take Prednisone  50mg  by mouth at: Thirteen hours prior to cath 8:00pm on Wednesday Seven hours  prior to cath 2:00am on Thursday And prior to leaving home please take last dose of Prednisone  50mg  and Benadryl  50mg  by mouth.  On the morning of your procedure, take Aspirin 81 mg and any morning medicines NOT listed above.  You may use sips of water.  6. Plan to go home the same day, you will only stay overnight if medically necessary.  7. You MUST have a responsible adult to drive you home.  8. An adult MUST be with you the first 24 hours after you arrive home.  9. Bring a current list of your medications, and the last time and date medication taken.  10. Bring ID and current insurance cards.  11.Please wear clothes that are easy to get on and off and wear slip-on shoes.  Thank you for allowing us  to care for you!   -- [facility]

## 2025-01-18 ENCOUNTER — Telehealth: Payer: Self-pay

## 2025-01-18 LAB — CBC
Hematocrit: 43.5 % (ref 34.0–46.6)
Hemoglobin: 14.1 g/dL (ref 11.1–15.9)
MCH: 27.4 pg (ref 26.6–33.0)
MCHC: 32.4 g/dL (ref 31.5–35.7)
MCV: 85 fL (ref 79–97)
Platelets: 252 x10E3/uL (ref 150–450)
RBC: 5.15 x10E6/uL (ref 3.77–5.28)
RDW: 13.4 % (ref 11.7–15.4)
WBC: 8.2 x10E3/uL (ref 3.4–10.8)

## 2025-01-18 LAB — BASIC METABOLIC PANEL WITH GFR
BUN/Creatinine Ratio: 15 (ref 12–28)
BUN: 13 mg/dL (ref 8–27)
CO2: 21 mmol/L (ref 20–29)
Calcium: 10 mg/dL (ref 8.7–10.3)
Chloride: 102 mmol/L (ref 96–106)
Creatinine, Ser: 0.86 mg/dL (ref 0.57–1.00)
Glucose: 104 mg/dL — AB (ref 70–99)
Potassium: 4.8 mmol/L (ref 3.5–5.2)
Sodium: 139 mmol/L (ref 134–144)
eGFR: 70 mL/min/1.73

## 2025-01-18 NOTE — Telephone Encounter (Signed)
 Left message for patient to return call. Need to reschedule heart cath to be with Dr. Elmira on 1/27 or 1/28.

## 2025-01-18 NOTE — Progress Notes (Signed)
 Left message to call back

## 2025-01-19 NOTE — Telephone Encounter (Signed)
 Called and spoke to pt to reschedule RT/LT heart cath from 01/25/25 with Dr. Mady to either 1/27 or 1/28 with Patwardhan. Due to weather, she is requesting cath be on 01/31/25 (okay per Elmira, MD).   Rescheduled for this date (01/31/25) at 8:30 am (arriving at 6:30 am). Reviewed pre med instructions (has contrast allergy) with new times of administration. Pt wrote all down and verbalized understanding.   No other concerns.

## 2025-01-20 ENCOUNTER — Ambulatory Visit: Payer: Self-pay | Admitting: Physician Assistant

## 2025-01-20 NOTE — Telephone Encounter (Signed)
 Noted.  Thanks MJP

## 2025-01-30 ENCOUNTER — Telehealth: Payer: Self-pay | Admitting: *Deleted

## 2025-01-30 NOTE — Telephone Encounter (Signed)
 Cardiac Catheterization scheduled at [facility] for: Wednesday January 31, 2025 8:30 AM Arrival time [facility] Main Entrance A at: 6:30 AM  Diet: -Nothing to eat after midnight.  Hydration: -May drink clear liquids until 2 hours before the procedure.  Approved liquids: Water , clear tea, black coffee, fruit juices-non-citric and without pulp,Gatorade, plain Jello/popsicles.   -Please drink 8 oz of water  2 hours before procedure.  CONTRAST ALLERGY: 13 hour Prednisone  and Benadryl  Prep: 01/30/25 Prednisone  50 mg 7:30 PM 01/31/25 Prednisone  50 mg 1:30 AM 01/31/25 Prednisone  50 mg and Benadryl  50 mg just prior to leaving home for hospital. Pt advised not to drive after taking Benadryl .  Medication instructions: -Usual morning medications can be taken including aspirin  81 mg.  Plan to go home the same day, you will only stay overnight if medically necessary.  You must have responsible adult to drive you home.  Someone must be with you the first 24 hours after you arrive home.  Reviewed procedure instructions with patient.

## 2025-01-31 ENCOUNTER — Ambulatory Visit (HOSPITAL_COMMUNITY)
Admission: RE | Admit: 2025-01-31 | Discharge: 2025-01-31 | Disposition: A | Discharge status: Home / Self Care | Attending: Cardiology | Admitting: Cardiology

## 2025-01-31 ENCOUNTER — Encounter (HOSPITAL_COMMUNITY)
Admission: RE | Disposition: A | Discharge status: Home / Self Care | Payer: Self-pay | Source: Home / Self Care | Attending: Cardiology

## 2025-01-31 ENCOUNTER — Other Ambulatory Visit: Payer: Self-pay

## 2025-01-31 ENCOUNTER — Telehealth: Payer: Self-pay

## 2025-01-31 ENCOUNTER — Other Ambulatory Visit (HOSPITAL_COMMUNITY): Payer: Self-pay

## 2025-01-31 DIAGNOSIS — I428 Other cardiomyopathies: Secondary | ICD-10-CM

## 2025-01-31 LAB — POCT I-STAT EG7
Acid-base deficit: 3 mmol/L — ABNORMAL HIGH (ref 0.0–2.0)
Bicarbonate: 24.1 mmol/L (ref 20.0–28.0)
Calcium, Ion: 1.36 mmol/L (ref 1.15–1.40)
HCT: 41 % (ref 36.0–46.0)
Hemoglobin: 13.9 g/dL (ref 12.0–15.0)
O2 Saturation: 62 %
Potassium: 4.4 mmol/L (ref 3.5–5.1)
Sodium: 142 mmol/L (ref 135–145)
TCO2: 26 mmol/L (ref 22–32)
pCO2, Ven: 52.2 mmHg (ref 44–60)
pH, Ven: 7.273 (ref 7.25–7.43)
pO2, Ven: 37 mmHg (ref 32–45)

## 2025-01-31 LAB — POCT I-STAT 7, (LYTES, BLD GAS, ICA,H+H)
Acid-base deficit: 4 mmol/L — ABNORMAL HIGH (ref 0.0–2.0)
Bicarbonate: 23.1 mmol/L (ref 20.0–28.0)
Calcium, Ion: 1.31 mmol/L (ref 1.15–1.40)
HCT: 41 % (ref 36.0–46.0)
Hemoglobin: 13.9 g/dL (ref 12.0–15.0)
O2 Saturation: 86 %
Potassium: 4.3 mmol/L (ref 3.5–5.1)
Sodium: 142 mmol/L (ref 135–145)
TCO2: 25 mmol/L (ref 22–32)
pCO2 arterial: 49.5 mmHg — ABNORMAL HIGH (ref 32–48)
pH, Arterial: 7.277 — ABNORMAL LOW (ref 7.35–7.45)
pO2, Arterial: 59 mmHg — ABNORMAL LOW (ref 83–108)

## 2025-01-31 MED ORDER — LIDOCAINE HCL (PF) 1 % IJ SOLN
INTRAMUSCULAR | Status: AC
  Filled 2025-01-31: qty 30

## 2025-01-31 MED ORDER — NITROGLYCERIN 1 MG/10 ML FOR IR/CATH LAB
INTRA_ARTERIAL | Status: DC | PRN
  Administered 2025-01-31: 300 ug

## 2025-01-31 MED ORDER — SODIUM CHLORIDE 0.9 % IV SOLN: 250.0000 mL | INTRAVENOUS | Status: DC | PRN

## 2025-01-31 MED ORDER — SODIUM CHLORIDE 0.9 % IV SOLN
INTRAVENOUS | Status: DC | PRN
  Administered 2025-01-31: 250 mL via INTRAVENOUS

## 2025-01-31 MED ORDER — HEPARIN SODIUM (PORCINE) 1000 UNIT/ML IJ SOLN
INTRAMUSCULAR | Status: DC | PRN
  Administered 2025-01-31: 3500 [IU] via INTRAVENOUS
  Administered 2025-01-31: 4000 [IU] via INTRAVENOUS

## 2025-01-31 MED ORDER — SODIUM CHLORIDE 0.9% FLUSH: 3.0000 mL | INTRAVENOUS | Status: DC | PRN

## 2025-01-31 MED ORDER — FENTANYL CITRATE (PF) 100 MCG/2ML IJ SOLN
INTRAMUSCULAR | Status: AC
  Filled 2025-01-31: qty 2

## 2025-01-31 MED ORDER — SODIUM CHLORIDE 0.9% FLUSH: 3.0000 mL | Freq: Two times a day (BID) | INTRAVENOUS | Status: DC

## 2025-01-31 MED ORDER — LABETALOL HCL 5 MG/ML IV SOLN: 10.0000 mg | INTRAVENOUS | Status: DC | PRN

## 2025-01-31 MED ORDER — HYDRALAZINE HCL 20 MG/ML IJ SOLN: 10.0000 mg | INTRAMUSCULAR | Status: DC | PRN

## 2025-01-31 MED ORDER — FUROSEMIDE 40 MG PO TABS
40.0000 mg | ORAL_TABLET | Freq: Every day | ORAL | 0 refills | Status: AC
  Filled 2025-01-31: qty 30, 30d supply, fill #0

## 2025-01-31 MED ORDER — LOSARTAN POTASSIUM 50 MG PO TABS
50.0000 mg | ORAL_TABLET | Freq: Every day | ORAL | 0 refills | Status: AC
  Filled 2025-01-31: qty 30, 30d supply, fill #0

## 2025-01-31 MED ORDER — PREDNISOLONE 5 MG PO TABS: 50.0000 mg | ORAL_TABLET | Freq: Once | ORAL | Status: DC

## 2025-01-31 MED ORDER — ASPIRIN 81 MG PO CHEW
81.0000 mg | CHEWABLE_TABLET | ORAL | Status: AC
  Administered 2025-01-31: 81 mg via ORAL
  Filled 2025-01-31: qty 1

## 2025-01-31 MED ORDER — HEPARIN (PORCINE) IN NACL 1000-0.9 UT/500ML-% IV SOLN
INTRAVENOUS | Status: DC | PRN
  Administered 2025-01-31: 1500 mL

## 2025-01-31 MED ORDER — LIDOCAINE HCL (PF) 1 % IJ SOLN
INTRAMUSCULAR | Status: DC | PRN
  Administered 2025-01-31: 10 mL
  Administered 2025-01-31: 5 mL

## 2025-01-31 MED ORDER — FENTANYL CITRATE (PF) 100 MCG/2ML IJ SOLN
INTRAMUSCULAR | Status: DC | PRN
  Administered 2025-01-31: 50 ug via INTRAVENOUS
  Administered 2025-01-31: 25 ug via INTRAVENOUS

## 2025-01-31 MED ORDER — HEPARIN SODIUM (PORCINE) 1000 UNIT/ML IJ SOLN
INTRAMUSCULAR | Status: AC
  Filled 2025-01-31: qty 10

## 2025-01-31 MED ORDER — MIDAZOLAM HCL 2 MG/2ML IJ SOLN
INTRAMUSCULAR | Status: AC
  Filled 2025-01-31: qty 2

## 2025-01-31 MED ORDER — VERAPAMIL HCL 2.5 MG/ML IV SOLN
INTRAVENOUS | Status: AC
  Filled 2025-01-31: qty 2

## 2025-01-31 MED ORDER — FREE WATER: 250.0000 mL | Freq: Once | Status: DC

## 2025-01-31 MED ORDER — NITROGLYCERIN 1 MG/10 ML FOR IR/CATH LAB
INTRA_ARTERIAL | Status: AC
  Filled 2025-01-31: qty 10

## 2025-01-31 MED ORDER — MIDAZOLAM HCL (PF) 2 MG/2ML IJ SOLN
INTRAMUSCULAR | Status: DC | PRN
  Administered 2025-01-31 (×2): 1 mg via INTRAVENOUS

## 2025-01-31 MED ORDER — ONDANSETRON HCL 4 MG/2ML IJ SOLN: 4.0000 mg | Freq: Four times a day (QID) | INTRAMUSCULAR | Status: DC | PRN

## 2025-01-31 MED ORDER — PREDNISONE 50 MG PO TABS
50.0000 mg | ORAL_TABLET | Freq: Once | ORAL | Status: AC
  Administered 2025-01-31: 50 mg via ORAL
  Filled 2025-01-31: qty 1

## 2025-01-31 MED ORDER — ACETAMINOPHEN 325 MG PO TABS
650.0000 mg | ORAL_TABLET | ORAL | Status: DC | PRN
  Administered 2025-01-31: 650 mg via ORAL
  Filled 2025-01-31: qty 2

## 2025-01-31 MED ORDER — VERAPAMIL HCL 2.5 MG/ML IV SOLN
INTRAVENOUS | Status: DC | PRN
  Administered 2025-01-31: 10 mL via INTRA_ARTERIAL

## 2025-01-31 MED ORDER — IOHEXOL 350 MG/ML SOLN
INTRAVENOUS | Status: DC | PRN
  Administered 2025-01-31: 75 mL

## 2025-01-31 NOTE — Progress Notes (Signed)
 TR BAND REMOVAL  LOCATION:    right radial  DEFLATED PER PROTOCOL:    Yes.    TIME BAND OFF / DRESSING APPLIED: 01/31/25 at 1340   SITE UPON ARRIVAL:    Level 0  SITE AFTER BAND REMOVAL:    Level 0  CIRCULATION SENSATION AND MOVEMENT:    Within Normal Limits   Yes.    COMMENTS:

## 2025-01-31 NOTE — Telephone Encounter (Signed)
 Left message to call back. MyChart message send.

## 2025-01-31 NOTE — H&P (Signed)
 OV 01/17/2025 copied for documentation   Cardiology Office Note   Date:  01/31/2025  ID:  Elizabeth Benton, Elizabeth Benton 1946-06-28, MRN [id number] PCP: Theo Iha, MD  [facility] Providers Cardiologist:  Newman JINNY Lawrence, MD   History of Present Illness Elizabeth Benton is a 79 y.o. female who presents for follow-up appointment with past medical history of hypertension, hyperlipidemia, and breast cancer.  She was seen in December 2025 and was noting increased shortness of breath with short walks at that time but no chest pain.  History includes bilateral mastectomy in 2023 with 2 additional surgeries in 2024 for fluid accumulation.  Treated with endocrine therapy most recently with letrozole  which she stopped around April/May 2025 due to dizziness and falls.  Did not receive any chemotherapy or radiation.  At her last office visit her LDL decreased from 134 down to 74 on Crestor  but she ended up stopping this medication and it rose to 156.  Blood pressure readings in general are 134-143/80-85.  Noting increased shortness of breath with short walks but no chest pain.  Checks blood pressure about twice a week with recorded values ranging from systolic of 80-1 43.  Lab work was ordered at that appointment.  Amlodipine  5 mg daily was started for hypertension.  Echocardiogram was ordered to evaluate for cardiac cause.  She also had a CT scan which showed a calcium  score of 73 which was 67 percentile for age, race, and sex matched controls.  Her echo showed a moderately reduced LVEF of 35 to 40% with severe hypokinesis of the apical septum and apex.  LBBB was noted with septal lateral dyssynchrony.  Findings could be related to LBBB but hypokinesis seems out of proportion to conduction disease.  She is being seen today to set up a right and left heart catheterization.  Today, she presents with a hx of coronary artery disease with shortness of breath and recent echocardiogram findings.  A recent  CT coronary calcium  scan showed a score of 73, in the 62nd percentile for her age and gender, with vessel calcification but no intervention recommended. An echocardiogram showed a left ventricular ejection fraction of 35-40% and a left bundle branch block.  Since around Christmas she has had exertional shortness of breath. She becomes very tired and winded with walking and needs to stop to rest. She has occasional swelling of the feet and ankles. Her LDL cholesterol in December was 161 mg/dL, and she started Crestor  20 mg daily just before Christmas. She has not had repeat labs since starting the statin.  Reports no shortness of breath nor dyspnea on exertion. Reports no chest pain, pressure, or tightness. No edema, orthopnea, PND. Reports no palpitations.   Discussed the use of AI scribe software for clinical note transcription with the patient, who gave verbal consent to proceed.  ROS: Pertinent ROS in HPI  Studies Reviewed     Echo 01/10/25 IMPRESSIONS     1. Moderately reduced LVEF 35-40%. Severe hypokinesis of the apical  septum and apex. LBBB noted with septal-lateral dys-synchrony. Findings  may all be related to LBBB, but hypokinesis seems out of proportion to  conduction disease. No definite LV  thrombus, but IV noto able to be obtained. Would recommend limited study  with contrast to exclude thrombus. Left ventricular ejection fraction, by  estimation, is 35 to 40%. The left ventricle has moderately decreased  function. The left ventricle  demonstrates regional wall motion abnormalities (see scoring  diagram/findings for description). Left  ventricular diastolic parameters  are consistent with Grade I diastolic dysfunction (impaired relaxation).   2. Right ventricular systolic function is normal. The right ventricular  size is normal. There is normal pulmonary artery systolic pressure. The  estimated right ventricular systolic pressure is 18.7 mmHg.   3. The mitral valve is  grossly normal. Trivial mitral valve  regurgitation. No evidence of mitral stenosis.   4. The aortic valve is tricuspid. Aortic valve regurgitation is not  visualized. No aortic stenosis is present.   5. The inferior vena cava is normal in size with greater than 50%  respiratory variability, suggesting right atrial pressure of 3 mmHg.      Physical Exam VS:  BP (!) 165/81   Pulse (!) 103   Temp 97.7 F (36.5 C) (Oral)   Resp 18   Ht 4' 10 (1.473 m)   Wt 64.9 kg   SpO2 95%   BMI 29.89 kg/m        Wt Readings from Last 3 Encounters:  01/31/25 64.9 kg  01/17/25 64.9 kg  01/08/25 63.2 kg    GEN: Well nourished, well developed in no acute distress NECK: No JVD; No carotid bruits CARDIAC: RRR, no murmurs, rubs, gallops RESPIRATORY:  Clear to auscultation without rales, wheezing or rhonchi  ABDOMEN: Soft, non-tender, non-distended EXTREMITIES:  No edema; No deformity   ASSESSMENT AND PLAN  Heart failure with reduced ejection fraction Severe hypokinesis with LVEF 35-40%. Possible contributing factors: LBBB, CAD. Symptoms: dyspnea on exertion. - Proceed with cardiac catheterization to assess CAD and potential stenting. - Schedule follow-up within 1-2 weeks post-procedure for wrist site and recovery assessment. - Arrange three-month follow-up with cardiologist.  Coronary artery disease Coronary calcium  score 73, 62nd percentile for age/gender. No immediate intervention based on CT. Cardiac catheterization planned to evaluate blockages. - Perform cardiac catheterization to evaluate coronary arteries and consider stenting if significant blockages found. - Monitor kidney function post-procedure due to contrast dye use.  Mixed hyperlipidemia LDL 161 mg/dL, above target <44 mg/dL for CAD. On Crestor  20 mg since December, no recent lipid panel. - Schedule follow-up lipid panel in approximately three weeks to assess response to Crestor . - Continue Crestor  20 mg daily.  Dyspnea on  exertion Shortness of breath with activity, worsening heart failure or CAD indicated. - Evaluate for CAD via cardiac catheterization. - Advised lifestyle modifications: elevate feet, use compression socks for peripheral edema.   The patient understands that risks include but are not limited to stroke (1 in 1000), death (1 in 1000), kidney failure [usually temporary] (1 in 500), bleeding (1 in 200), allergic reaction [possibly serious] (1 in 200), and agrees to proceed.        Dispo: She can follow-up in 3 weeks post cath  Signed, Newman JINNY Lawrence, MD

## 2025-01-31 NOTE — Progress Notes (Signed)
 Site area: right fem Site Prior to Removal:  Level 0 Pressure Applied For: 15 minutes Manual:   yes Patient Status During Pull:  stable Post Pull Site:  Level 0 Post Pull Instructions Given:  yes Post Pull Pulses Present: yes Dressing Applied:  yes CDI Bedrest begins @ 1050 Comments:

## 2025-01-31 NOTE — Interval H&P Note (Signed)
 History and Physical Interval Note:  01/31/2025 8:07 AM  Elizabeth Benton  has presented today for surgery, with the diagnosis of cardiomyopahty.  The various methods of treatment have been discussed with the patient and family. After consideration of risks, benefits and other options for treatment, the patient has consented to  Procedures: RIGHT/LEFT HEART CATH AND CORONARY ANGIOGRAPHY (N/A) as a surgical intervention.  The patient's history has been reviewed, patient examined, no change in status, stable for surgery.  I have reviewed the patient's chart and labs.  Questions were answered to the patient's satisfaction.     Melana Hingle J Tyreanna Bisesi

## 2025-02-01 ENCOUNTER — Encounter (HOSPITAL_COMMUNITY): Payer: Self-pay | Admitting: Cardiology

## 2025-02-07 ENCOUNTER — Ambulatory Visit (HOSPITAL_COMMUNITY)

## 2025-02-14 ENCOUNTER — Ambulatory Visit: Admitting: Physician Assistant

## 2025-07-09 ENCOUNTER — Ambulatory Visit

## 2025-08-23 ENCOUNTER — Encounter: Admitting: Adult Health
